# Patient Record
Sex: Male | Born: 1960 | Race: White | Hispanic: Refuse to answer | Smoking: Former smoker
Health system: Southern US, Community
[De-identification: ages and names within clinical notes are randomized; demographics above are authoritative.]

## PROBLEM LIST (undated history)

## (undated) DIAGNOSIS — F32A Depression, unspecified: Secondary | ICD-10-CM

## (undated) DIAGNOSIS — N281 Cyst of kidney, acquired: Secondary | ICD-10-CM

## (undated) DIAGNOSIS — F1910 Other psychoactive substance abuse, uncomplicated: Secondary | ICD-10-CM

## (undated) DIAGNOSIS — K219 Gastro-esophageal reflux disease without esophagitis: Secondary | ICD-10-CM

## (undated) DIAGNOSIS — E785 Hyperlipidemia, unspecified: Secondary | ICD-10-CM

## (undated) DIAGNOSIS — M479 Spondylosis, unspecified: Secondary | ICD-10-CM

## (undated) DIAGNOSIS — F419 Anxiety disorder, unspecified: Secondary | ICD-10-CM

## (undated) DIAGNOSIS — M1990 Unspecified osteoarthritis, unspecified site: Secondary | ICD-10-CM

## (undated) DIAGNOSIS — G709 Myoneural disorder, unspecified: Secondary | ICD-10-CM

## (undated) HISTORY — DX: Hyperlipidemia, unspecified: E78.5

## (undated) HISTORY — DX: Depression, unspecified: F32.A

## (undated) HISTORY — DX: Myoneural disorder, unspecified: G70.9

## (undated) HISTORY — DX: Other psychoactive substance abuse, uncomplicated: F19.10

## (undated) HISTORY — PX: OTHER SURGICAL HISTORY: SHX169

## (undated) HISTORY — DX: Cyst of kidney, acquired: N28.1

## (undated) HISTORY — DX: Spondylosis, unspecified: M47.9

## (undated) HISTORY — DX: Gastro-esophageal reflux disease without esophagitis: K21.9

## (undated) HISTORY — DX: Anxiety disorder, unspecified: F41.9

## (undated) HISTORY — DX: Unspecified osteoarthritis, unspecified site: M19.90

---

## 1993-02-14 HISTORY — PX: ANTERIOR FUSION CERVICAL SPINE: SUR626

## 2001-12-14 ENCOUNTER — Ambulatory Visit (HOSPITAL_COMMUNITY): Admission: RE | Admit: 2001-12-14 | Discharge: 2001-12-14 | Payer: Self-pay | Admitting: Gastroenterology

## 2001-12-14 ENCOUNTER — Encounter (INDEPENDENT_AMBULATORY_CARE_PROVIDER_SITE_OTHER): Payer: Self-pay | Admitting: Specialist

## 2002-09-18 ENCOUNTER — Encounter: Admission: RE | Admit: 2002-09-18 | Discharge: 2002-09-18 | Payer: Self-pay | Admitting: Internal Medicine

## 2002-09-18 ENCOUNTER — Encounter: Payer: Self-pay | Admitting: Internal Medicine

## 2002-10-17 ENCOUNTER — Encounter: Payer: Self-pay | Admitting: Gastroenterology

## 2002-10-17 ENCOUNTER — Ambulatory Visit (HOSPITAL_COMMUNITY): Admission: RE | Admit: 2002-10-17 | Discharge: 2002-10-17 | Payer: Self-pay | Admitting: Gastroenterology

## 2004-11-23 ENCOUNTER — Ambulatory Visit: Payer: Self-pay | Admitting: Internal Medicine

## 2004-12-14 ENCOUNTER — Ambulatory Visit: Payer: Self-pay | Admitting: Gastroenterology

## 2004-12-28 ENCOUNTER — Ambulatory Visit: Payer: Self-pay | Admitting: Gastroenterology

## 2005-02-03 ENCOUNTER — Ambulatory Visit: Payer: Self-pay | Admitting: Internal Medicine

## 2005-02-22 ENCOUNTER — Ambulatory Visit: Payer: Self-pay | Admitting: Internal Medicine

## 2005-05-17 ENCOUNTER — Ambulatory Visit: Payer: Self-pay | Admitting: Internal Medicine

## 2005-05-24 ENCOUNTER — Ambulatory Visit: Payer: Self-pay | Admitting: Internal Medicine

## 2005-10-28 ENCOUNTER — Ambulatory Visit (HOSPITAL_COMMUNITY): Payer: Self-pay | Admitting: Psychiatry

## 2006-02-24 ENCOUNTER — Ambulatory Visit (HOSPITAL_COMMUNITY): Payer: Self-pay | Admitting: Psychiatry

## 2006-09-15 ENCOUNTER — Ambulatory Visit (HOSPITAL_COMMUNITY): Payer: Self-pay | Admitting: Psychiatry

## 2007-02-23 ENCOUNTER — Ambulatory Visit (HOSPITAL_COMMUNITY): Payer: Self-pay | Admitting: Psychiatry

## 2007-06-22 ENCOUNTER — Ambulatory Visit (HOSPITAL_COMMUNITY): Payer: Self-pay | Admitting: Psychiatry

## 2007-10-26 ENCOUNTER — Ambulatory Visit (HOSPITAL_COMMUNITY): Payer: Self-pay | Admitting: Psychiatry

## 2008-02-25 ENCOUNTER — Ambulatory Visit (HOSPITAL_COMMUNITY): Payer: Self-pay | Admitting: Psychiatry

## 2008-05-05 DIAGNOSIS — K219 Gastro-esophageal reflux disease without esophagitis: Secondary | ICD-10-CM | POA: Insufficient documentation

## 2008-07-11 ENCOUNTER — Ambulatory Visit (HOSPITAL_COMMUNITY): Payer: Self-pay | Admitting: Psychiatry

## 2008-10-03 ENCOUNTER — Ambulatory Visit (HOSPITAL_COMMUNITY): Payer: Self-pay | Admitting: Psychiatry

## 2008-10-31 ENCOUNTER — Ambulatory Visit (HOSPITAL_COMMUNITY): Payer: Self-pay | Admitting: Psychiatry

## 2009-01-05 ENCOUNTER — Ambulatory Visit (HOSPITAL_COMMUNITY): Payer: Self-pay | Admitting: Psychiatry

## 2009-03-04 ENCOUNTER — Ambulatory Visit (HOSPITAL_COMMUNITY): Payer: Self-pay | Admitting: Psychiatry

## 2009-03-07 DIAGNOSIS — M47816 Spondylosis without myelopathy or radiculopathy, lumbar region: Secondary | ICD-10-CM | POA: Insufficient documentation

## 2009-04-23 ENCOUNTER — Emergency Department (HOSPITAL_BASED_OUTPATIENT_CLINIC_OR_DEPARTMENT_OTHER): Admission: EM | Admit: 2009-04-23 | Discharge: 2009-04-23 | Payer: Self-pay | Admitting: Emergency Medicine

## 2009-04-23 ENCOUNTER — Ambulatory Visit: Payer: Self-pay | Admitting: Diagnostic Radiology

## 2009-04-29 ENCOUNTER — Encounter: Admission: RE | Admit: 2009-04-29 | Discharge: 2009-04-29 | Payer: Self-pay | Admitting: Orthopaedic Surgery

## 2009-05-01 ENCOUNTER — Encounter: Admission: RE | Admit: 2009-05-01 | Discharge: 2009-07-30 | Payer: Self-pay | Admitting: Orthopaedic Surgery

## 2009-05-26 ENCOUNTER — Encounter: Admission: RE | Admit: 2009-05-26 | Discharge: 2009-05-26 | Payer: Self-pay | Admitting: Orthopaedic Surgery

## 2009-06-18 ENCOUNTER — Encounter: Admission: RE | Admit: 2009-06-18 | Discharge: 2009-06-18 | Payer: Self-pay | Admitting: Orthopaedic Surgery

## 2009-08-10 ENCOUNTER — Encounter: Admission: RE | Admit: 2009-08-10 | Discharge: 2009-08-19 | Payer: Self-pay | Admitting: Orthopaedic Surgery

## 2010-01-01 ENCOUNTER — Encounter: Admission: RE | Admit: 2010-01-01 | Discharge: 2010-01-01 | Payer: Self-pay | Admitting: Orthopaedic Surgery

## 2010-05-20 ENCOUNTER — Other Ambulatory Visit: Payer: Self-pay | Admitting: Orthopaedic Surgery

## 2010-05-20 DIAGNOSIS — M549 Dorsalgia, unspecified: Secondary | ICD-10-CM

## 2010-05-31 ENCOUNTER — Ambulatory Visit
Admission: RE | Admit: 2010-05-31 | Discharge: 2010-05-31 | Disposition: A | Discharge status: Home / Self Care | Payer: Self-pay | Source: Ambulatory Visit | Attending: Orthopaedic Surgery | Admitting: Orthopaedic Surgery

## 2010-05-31 DIAGNOSIS — M549 Dorsalgia, unspecified: Secondary | ICD-10-CM

## 2010-07-02 NOTE — Letter (Signed)
March 31, 2006    Ileene Musa  [address]  [place], [place] [address]   RE:  WINDLE, HUEBERT  MRN:  [id number]  /  DOB:  09-29-1960   Dear Reita Cliche,   The triage nurse states you declined an appointment to follow up your  cholesterol panel;therefore,I reviewed your chart to assess your  cadiovascular risks for premature heart attack or stroke.   The most important factor is your having quit smoking.  This decreased  your risk from 1 in 3 to approximately 25%, based on the special  cholesterol panel you had in October 2006.  At that time, your LDL, or  bad cholesterol, was 168; it was composed of 3563 total particles and  2954 small dense particles, which are atherogenic or plaque-forming.  You should have no more than 1300 total particles and no more than 850  small dense particles.  At that time, there was also a major dietary  contribution, probably from white carbohydrates such as potatoes, rice,  bread and pasta.  The pattern at that time also was a pre-diabetic one.   With the Vytorin 10/40, your LDL, or bad cholesterol, dropped 100 points  to 68, which would be an optimal level.  On the Vytorin, your good  cholesterol actually rose from 14 to 26; it should be over 40.  Unfortunately, you did have elevated liver enzymes.  It is unclear  whether this was related to the Vytorin.  Other medications or dietary  factors may have  played a role.  Excess Tylenol, vitamin A or alcohol  can also be a component.  The most common cause of liver enzyme  elevations in [place] is fat distribution in the abdomen with  involvement of the liver.   While on Zetia alone, your LDL was 108, which would be associated with a  10% to 15% risk.  Your liver enzymes improved dramatically and were  essentially normal on the Zetia.   Basically, you cannot have a heart attack or stroke unless you have  plaque or hardening of the arteries which progresses if  LDL, or bad  cholesterol, and sugars (blood glucoses) remain elevated.  Smoking  simply tears down the plaque and makes it more likely to rupture; that  is why it is so important that you have stopped smoking.  When you were  last seen on April 4, your blood pressure was 140/90.Your blood pressure  should be less than 130/80 on average; 140/90 would be associated with 4  times risk of heart attack or stroke because of its sheer force on any  plaque in the arteries.   You were scheduled for labs in September; the nurse informs me that you  will not have these until July and have stopped the Zetia.   I work for you and my job is to inform you of your risks and inform you  of your options to address those risks if you so choose.   Please feel free to get a second opinion concerning these risks; I will  be happy to arrange that consultation should you desire.    Sincerely,      Titus Dubin. Alwyn Ren, MD,FACP,FCCP  Electronically Signed    [facility]  DD: 03/31/2006  DT: 03/31/2006  Job #: [id number]

## 2010-09-27 ENCOUNTER — Other Ambulatory Visit: Payer: Self-pay | Admitting: Orthopaedic Surgery

## 2010-09-27 DIAGNOSIS — M545 Low back pain: Secondary | ICD-10-CM

## 2010-10-11 ENCOUNTER — Ambulatory Visit
Admission: RE | Admit: 2010-10-11 | Discharge: 2010-10-11 | Disposition: A | Discharge status: Home / Self Care | Payer: No Typology Code available for payment source | Source: Ambulatory Visit | Attending: Orthopaedic Surgery | Admitting: Orthopaedic Surgery

## 2010-10-11 DIAGNOSIS — M545 Low back pain: Secondary | ICD-10-CM

## 2010-10-11 MED ORDER — METHYLPREDNISOLONE ACETATE 40 MG/ML INJ SUSP (RADIOLOG
120.0000 mg | Freq: Once | INTRAMUSCULAR | Status: AC
  Administered 2010-10-11: 120 mg via EPIDURAL

## 2010-10-11 MED ORDER — IOHEXOL 180 MG/ML  SOLN
1.0000 mL | Freq: Once | INTRAMUSCULAR | Status: AC | PRN
  Administered 2010-10-11: 1 mL via EPIDURAL
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
## 2011-07-13 ENCOUNTER — Other Ambulatory Visit: Payer: Self-pay | Admitting: Neurosurgery

## 2011-07-13 DIAGNOSIS — M549 Dorsalgia, unspecified: Secondary | ICD-10-CM

## 2011-07-19 ENCOUNTER — Ambulatory Visit
Admission: RE | Admit: 2011-07-19 | Discharge: 2011-07-19 | Disposition: A | Discharge status: Home / Self Care | Payer: No Typology Code available for payment source | Source: Ambulatory Visit | Attending: Neurosurgery | Admitting: Neurosurgery

## 2011-07-19 DIAGNOSIS — M549 Dorsalgia, unspecified: Secondary | ICD-10-CM

## 2011-07-19 MED ORDER — IOHEXOL 180 MG/ML  SOLN
1.0000 mL | Freq: Once | INTRAMUSCULAR | Status: AC | PRN
  Administered 2011-07-19: 1 mL via EPIDURAL

## 2011-07-19 MED ORDER — METHYLPREDNISOLONE ACETATE 40 MG/ML INJ SUSP (RADIOLOG
120.0000 mg | Freq: Once | INTRAMUSCULAR | Status: AC
  Administered 2011-07-19: 120 mg via EPIDURAL

## 2011-11-21 ENCOUNTER — Other Ambulatory Visit: Payer: Self-pay | Admitting: Neurosurgery

## 2011-11-21 DIAGNOSIS — M549 Dorsalgia, unspecified: Secondary | ICD-10-CM

## 2011-11-21 DIAGNOSIS — M5410 Radiculopathy, site unspecified: Secondary | ICD-10-CM

## 2011-11-23 ENCOUNTER — Other Ambulatory Visit: Payer: Self-pay

## 2011-12-01 ENCOUNTER — Other Ambulatory Visit: Payer: Self-pay | Admitting: Neurosurgery

## 2011-12-01 ENCOUNTER — Ambulatory Visit
Admission: RE | Admit: 2011-12-01 | Discharge: 2011-12-01 | Disposition: A | Discharge status: Home / Self Care | Payer: Self-pay | Source: Ambulatory Visit | Attending: Neurosurgery | Admitting: Neurosurgery

## 2011-12-01 DIAGNOSIS — M5410 Radiculopathy, site unspecified: Secondary | ICD-10-CM

## 2011-12-01 DIAGNOSIS — M549 Dorsalgia, unspecified: Secondary | ICD-10-CM

## 2011-12-01 MED ORDER — IOHEXOL 180 MG/ML  SOLN
1.0000 mL | Freq: Once | INTRAMUSCULAR | Status: AC | PRN
  Administered 2011-12-01: 1 mL via EPIDURAL

## 2011-12-01 MED ORDER — METHYLPREDNISOLONE ACETATE 40 MG/ML INJ SUSP (RADIOLOG
120.0000 mg | Freq: Once | INTRAMUSCULAR | Status: AC
  Administered 2011-12-01: 120 mg via EPIDURAL

## 2011-12-09 ENCOUNTER — Encounter: Payer: Self-pay | Admitting: Gastroenterology

## 2012-02-23 ENCOUNTER — Ambulatory Visit (INDEPENDENT_AMBULATORY_CARE_PROVIDER_SITE_OTHER): Payer: Medicare Other | Admitting: Surgery

## 2012-02-23 ENCOUNTER — Encounter (INDEPENDENT_AMBULATORY_CARE_PROVIDER_SITE_OTHER): Payer: Self-pay | Admitting: Surgery

## 2012-02-23 VITALS — BP 144/72 | HR 68 | Temp 97.4°F | Resp 12 | Ht 70.0 in | Wt 208.0 lb

## 2012-02-23 DIAGNOSIS — K439 Ventral hernia without obstruction or gangrene: Secondary | ICD-10-CM

## 2012-02-23 NOTE — Progress Notes (Signed)
Chief Complaint:  Epigastric hernia  History of Present Illness:  Nathan Meyer is an 52 y.o. male who is followed by Dr. Rosanne Ashing little. He has chronic low back pain at L5 requiring steroid injections. He has a hernia slightly to the right of midline 3 fingerbreadths above the umbilicus. This is consistent with an epigastric hernia with preperitoneal fat herniated into the hernia. He does work out at [employer] and we does it sometimes gets large and sore. I explained the nature of this and what to look for including nausea and vomiting. He has not had that.    I discussed repair with him. I think at the moment he wants to hold off on doing that. I will give him a booklet on hernia repair. When he decides to have surgery would be glad to go ahead and proceed with a laparoscopically-assisted ventral hernia repair with mesh done as an outpatient at [facility].  Past Medical History  Diagnosis Date  . Arthritis   . GERD (gastroesophageal reflux disease)   . Hyperlipidemia   . Neuromuscular disorder   . Substance abuse     Past Surgical History  Procedure Date  . Anterior fusion cervical spine 1995  . Finger debridement     Current Outpatient Prescriptions  Medication Sig Dispense Refill  . buPROPion (WELLBUTRIN XL) 300 MG 24 hr tablet Take 300 mg by mouth daily.      . lansoprazole (PREVACID) 15 MG capsule Take 15 mg by mouth daily.      Marland Kitchen oxyCODONE-acetaminophen (PERCOCET) 7.5-325 MG per tablet Take 1 tablet by mouth every 4 (four) hours as needed.      . traZODone (DESYREL) 100 MG tablet Take 100 mg by mouth at bedtime. Takes (2) 100mg  tabs every night       Nsaids History reviewed. No pertinent family history. Social History:   reports that he quit smoking about 2 years ago. His smoking use included Cigarettes. He has a 25 pack-year smoking history. He has never used smokeless tobacco. He reports that he does not drink alcohol or use illicit drugs.   REVIEW OF SYSTEMS - PERTINENT  POSITIVES ONLY: noncontributory  Physical Exam:   Blood pressure 144/72, pulse 68, temperature 97.4 F (36.3 C), temperature source Temporal, resp. rate 12, height 5\' 10"  (1.778 m), weight 208 lb (94.348 kg). Body mass index is 29.84 kg/(m^2).  Gen:  WDWN white male NAD  Neurological: Alert and oriented to person, place, and time. Motor and sensory function is grossly intact  Head: Normocephalic and atraumatic.  Eyes: Conjunctivae are normal. Pupils are equal, round, and reactive to light. No scleral icterus.  Neck: Normal range of motion. Neck supple. No tracheal deviation or thyromegaly present.  Cardiovascular:  SR without murmurs or gallops.  No carotid bruits Respiratory: Effort normal.  No respiratory distress. No chest wall tenderness. Breath sounds normal.  No wheezes, rales or rhonchi.  Abdomen:  Easily palpable but diffuse hernia in the right upper quadrant slightly to the right of midline 3 fingerbreadths above the umbilicus consistent with a spontaneous ventral hernia with preperitoneal fat herniated above the fascia. GU: Musculoskeletal: Normal range of motion. Extremities are nontender. No cyanosis, edema or clubbing noted Lymphadenopathy: No cervical, preauricular, postauricular or axillary adenopathy is present Skin: Skin is warm and dry. No rash noted. No diaphoresis. No erythema. No pallor. Pscyh: Normal mood and affect. Behavior is normal. Judgment and thought content normal.   LABORATORY RESULTS: No results found for this or  any previous visit (from the past 48 hour(s)).  RADIOLOGY RESULTS: No results found.  Problem List: There is no problem list on file for this patient.   Assessment & Plan: Ventral hernia upper abdomen. Recommend ventral herniorrhaphy when patient decides he once to move forward with that. Booklet on hernia repairs given to him.    Matt B. Daphine Deutscher, MD, [facility] [phone] beeper [phone]  02/23/2012 12:18  PM

## 2012-06-06 ENCOUNTER — Encounter: Payer: Self-pay | Admitting: Gastroenterology

## 2012-08-06 ENCOUNTER — Ambulatory Visit: Payer: Medicare Other

## 2012-08-06 ENCOUNTER — Ambulatory Visit (INDEPENDENT_AMBULATORY_CARE_PROVIDER_SITE_OTHER): Payer: Medicare Other | Admitting: Internal Medicine

## 2012-08-06 VITALS — BP 117/77 | HR 84 | Temp 97.9°F | Resp 18 | Ht 70.0 in | Wt 191.0 lb

## 2012-08-06 DIAGNOSIS — K529 Noninfective gastroenteritis and colitis, unspecified: Secondary | ICD-10-CM

## 2012-08-06 DIAGNOSIS — R079 Chest pain, unspecified: Secondary | ICD-10-CM
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
## 2012-11-05 ENCOUNTER — Other Ambulatory Visit: Payer: Self-pay | Admitting: Neurosurgery

## 2012-11-05 DIAGNOSIS — M5416 Radiculopathy, lumbar region: Secondary | ICD-10-CM

## 2012-11-08 ENCOUNTER — Ambulatory Visit
Admission: RE | Admit: 2012-11-08 | Discharge: 2012-11-08 | Disposition: A | Discharge status: Home / Self Care | Payer: Medicare Other | Source: Ambulatory Visit | Attending: Neurosurgery | Admitting: Neurosurgery

## 2012-11-08 DIAGNOSIS — M5416 Radiculopathy, lumbar region: Secondary | ICD-10-CM

## 2012-11-08 MED ORDER — IOHEXOL 180 MG/ML  SOLN
1.0000 mL | Freq: Once | INTRAMUSCULAR | Status: AC | PRN
  Administered 2012-11-08: 1 mL via EPIDURAL

## 2012-11-08 MED ORDER — METHYLPREDNISOLONE ACETATE 40 MG/ML INJ SUSP (RADIOLOG
120.0000 mg | Freq: Once | INTRAMUSCULAR | Status: AC
  Administered 2012-11-08: 120 mg via EPIDURAL

## 2013-10-03 ENCOUNTER — Other Ambulatory Visit: Payer: Self-pay | Admitting: Gastroenterology

## 2015-02-18 ENCOUNTER — Emergency Department
Admission: EM | Admit: 2015-02-18 | Discharge: 2015-02-18 | Disposition: A | Discharge status: Home / Self Care | Payer: No Typology Code available for payment source | Attending: Emergency Medicine | Admitting: Emergency Medicine

## 2015-02-18 ENCOUNTER — Emergency Department: Payer: No Typology Code available for payment source

## 2015-02-18 DIAGNOSIS — Z792 Long term (current) use of antibiotics: Secondary | ICD-10-CM | POA: Diagnosis not present

## 2015-02-18 DIAGNOSIS — Y92410 Unspecified street and highway as the place of occurrence of the external cause: Secondary | ICD-10-CM | POA: Insufficient documentation

## 2015-02-18 DIAGNOSIS — Y9389 Activity, other specified: Secondary | ICD-10-CM | POA: Insufficient documentation

## 2015-02-18 DIAGNOSIS — Y998 Other external cause status: Secondary | ICD-10-CM | POA: Insufficient documentation

## 2015-02-18 DIAGNOSIS — M25511 Pain in right shoulder: Secondary | ICD-10-CM

## 2015-02-18 DIAGNOSIS — S199XXA Unspecified injury of neck, initial encounter: Secondary | ICD-10-CM | POA: Diagnosis not present

## 2015-02-18 DIAGNOSIS — S299XXA Unspecified injury of thorax, initial encounter: Secondary | ICD-10-CM | POA: Diagnosis not present

## 2015-02-18 DIAGNOSIS — M546 Pain in thoracic spine: Secondary | ICD-10-CM

## 2015-02-18 DIAGNOSIS — S4991XA Unspecified injury of right shoulder and upper arm, initial encounter: Secondary | ICD-10-CM | POA: Insufficient documentation

## 2015-02-18 DIAGNOSIS — Z87891 Personal history of nicotine dependence: Secondary | ICD-10-CM | POA: Diagnosis not present

## 2015-02-18 DIAGNOSIS — Z79899 Other long term (current) drug therapy: Secondary | ICD-10-CM | POA: Insufficient documentation

## 2015-02-18 MED ORDER — TIZANIDINE HCL 4 MG PO TABS: 4.0000 mg | ORAL_TABLET | Freq: Four times a day (QID) | ORAL | Status: DC | PRN

## 2015-02-18 MED ORDER — TIZANIDINE HCL 4 MG PO TABS
4.0000 mg | ORAL_TABLET | Freq: Once | ORAL | Status: AC
  Administered 2015-02-18: 4 mg via ORAL
  Filled 2015-02-18: qty 1
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
## 2015-03-03 ENCOUNTER — Ambulatory Visit
Admission: RE | Admit: 2015-03-03 | Discharge: 2015-03-03 | Disposition: A | Discharge status: Home / Self Care | Payer: Self-pay | Source: Ambulatory Visit | Attending: Family Medicine | Admitting: Family Medicine

## 2015-03-03 ENCOUNTER — Other Ambulatory Visit: Payer: Self-pay | Admitting: Family Medicine

## 2015-03-03 DIAGNOSIS — M545 Low back pain, unspecified: Secondary | ICD-10-CM

## 2016-12-13 DIAGNOSIS — M79644 Pain in right finger(s): Secondary | ICD-10-CM | POA: Insufficient documentation
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
## 2017-04-14 DIAGNOSIS — I10 Essential (primary) hypertension: Secondary | ICD-10-CM | POA: Insufficient documentation

## 2019-01-30 ENCOUNTER — Ambulatory Visit (INDEPENDENT_AMBULATORY_CARE_PROVIDER_SITE_OTHER): Payer: Self-pay | Admitting: Otolaryngology

## 2019-01-31 ENCOUNTER — Ambulatory Visit (INDEPENDENT_AMBULATORY_CARE_PROVIDER_SITE_OTHER): Payer: Self-pay | Admitting: Otolaryngology

## 2019-02-21 ENCOUNTER — Other Ambulatory Visit: Payer: Self-pay

## 2019-02-21 ENCOUNTER — Encounter (INDEPENDENT_AMBULATORY_CARE_PROVIDER_SITE_OTHER): Payer: Self-pay | Admitting: Otolaryngology

## 2019-02-21 ENCOUNTER — Ambulatory Visit (INDEPENDENT_AMBULATORY_CARE_PROVIDER_SITE_OTHER): Payer: Medicare Other | Admitting: Otolaryngology

## 2019-02-21 VITALS — Temp 98.1°F

## 2019-02-21 DIAGNOSIS — J387 Other diseases of larynx: Secondary | ICD-10-CM

## 2019-02-21 NOTE — Progress Notes (Signed)
HPI: Nathan Meyer is a 59 y.o. male who presents is referred by Dr. Therisa Doyne For evaluation of abnormal appearing epiglottis on recent upper endoscopy.  Photos of this revealed a rounded yellow density within the vallecular region of the epiglottis.  Patient denies sore throat.  No hoarseness..  Past Medical History:  Diagnosis Date  . Arthritis   . GERD (gastroesophageal reflux disease)   . Hyperlipidemia   . Neuromuscular disorder ([place])   . Substance abuse [facility])    Past Surgical History:  Procedure Laterality Date  . ANTERIOR FUSION CERVICAL SPINE  1995  . finger debridement     Social History   Socioeconomic History  . Marital status: Divorced    Spouse name: Not on file  . Number of children: Not on file  . Years of education: Not on file  . Highest education level: Not on file  Occupational History  . Not on file  Tobacco Use  . Smoking status: Current Every Day Smoker    Packs/day: 1.00    Years: 26.00    Pack years: 26.00    Types: Cigarettes    Start date: 76  . Smokeless tobacco: Never Used  Substance and Sexual Activity  . Alcohol use: No  . Drug use: No    Comment: former substance abuser - cocaine, pills, marijuana  . Sexual activity: Not on file  Other Topics Concern  . Not on file  Social History Narrative  . Not on file   Social Determinants of Health   Financial Resource Strain:   . Difficulty of Paying Living Expenses: Not on file  Food Insecurity:   . Worried About Charity fundraiser in the Last Year: Not on file  . Ran Out of Food in the Last Year: Not on file  Transportation Needs:   . Lack of Transportation (Medical): Not on file  . Lack of Transportation (Non-Medical): Not on file  Physical Activity:   . Days of Exercise per Week: Not on file  . Minutes of Exercise per Session: Not on file  Stress:   . Feeling of Stress : Not on file  Social Connections:   . Frequency of Communication with Friends and Family: Not on file  .  Frequency of Social Gatherings with Friends and Family: Not on file  . Attends Religious Services: Not on file  . Active Member of Clubs or Organizations: Not on file  . Attends Archivist Meetings: Not on file  . Marital Status: Not on file   Family History  Problem Relation Age of Onset  . COPD Father    Allergies  Allergen Reactions  . Nsaids Nausea And Vomiting    Only in very high doses   Prior to Admission medications   Medication Sig Start Date End Date Taking? Authorizing Provider  buPROPion (WELLBUTRIN XL) 300 MG 24 hr tablet Take 300 mg by mouth daily.   Yes [provider]  diazepam (VALIUM) 5 MG tablet Take 5 mg by mouth every 6 (six) hours as needed for anxiety.   Yes [provider]  lansoprazole (PREVACID) 15 MG capsule Take 15 mg by mouth daily.   Yes [provider]  minocycline (DYNACIN) 50 MG tablet Take 50 mg by mouth 2 (two) times daily.   Yes [provider]  oxyCODONE-acetaminophen (PERCOCET) 7.5-325 MG per tablet Take 1 tablet by mouth every 4 (four) hours as needed.   Yes [provider]  tiZANidine (ZANAFLEX) 4 MG tablet  Take 1 tablet (4 mg total) by mouth every 6 (six) hours as needed for muscle spasms. 02/18/15  Yes Triplett, Cari B, FNP  traZODone (DESYREL) 100 MG tablet Take 100 mg by mouth at bedtime. Takes (2) 100mg  tabs every night   Yes [provider]     Positive ROS: Otherwise negative  All other systems have been reviewed and were otherwise negative with the exception of those mentioned in the HPI and as above.  Physical Exam: Constitutional: Alert, well-appearing, no acute distress Ears: External ears without lesions or tenderness. Ear canals are clear bilaterally with intact, clear TMs.  Nasal: External nose without lesions. Septum relatively midline.. Clear nasal passages Indirect laryngoscopy revealed a large vallecular cyst within the right side of the right vallecula of the  epiglottis.  Epiglottis was displaced slightly posterior making visualization of the vocal cords difficult. Fiberoptic laryngoscopy revealed clear nasopharynx.  Again noted is a large vallecular cyst which appears benign.  Fiberoptic laryngoscopy on other side of the epiglottis revealed a clear endolarynx with normal-appearing laryngeal surface of epiglottis.  AE folds were normal.  Vocal cords were clear with normal mobility bilaterally. Oral: Lips and gums without lesions. Tongue and palate mucosa without lesions. Posterior oropharynx clear. Neck: No palpable adenopathy or masses Respiratory: Breathing comfortably  Skin: No facial/neck lesions or rash noted.  Laryngoscopy  Date/Time: 02/21/2019 5:22 PM Performed by: Rozetta Nunnery, MD Authorized by: Rozetta Nunnery, MD   Consent:    Consent obtained:  Verbal   Consent given by:  Patient   Risks discussed:  Pain Procedure details:    Indications: difficult indirect mirror examination     Medication:  Afrin   Scope location: left nare   Sinus:    Left nasopharynx: normal   Mouth:    Oropharynx: normal     lesion      comment:  Patient has a large right vallecular cyst Throat:    Pyriform sinus: normal     False vocal cords: normal     True vocal cords: normal   Comments:     Patient with a large right benign-appearing vallecular cyst.  This slightly displaces the epiglottis.  But patient is having no difficulty with breathing and no significant trouble swallowing.    Assessment: Benign vallecular cyst  Plan: No further therapy is required of this unless it becomes symptomatic.  These are quite frequent and benign but this is rather large but not really causing any significant symptoms.   Radene Journey, MD   CC:

## 2019-05-09 ENCOUNTER — Ambulatory Visit: Payer: Medicare Other | Attending: Internal Medicine

## 2019-05-09 DIAGNOSIS — Z23 Encounter for immunization: Secondary | ICD-10-CM

## 2019-05-09 NOTE — Progress Notes (Signed)
   Covid-19 Vaccination Clinic  Name:  DANYELLE DITTA    MRN: [id number] DOB: 04/20/1960  05/09/2019  Mr. Thorn was observed post Covid-19 immunization for 15 minutes without incident. He was provided with Vaccine Information Sheet and instruction to access the V-Safe system.   Mr. Buffone was instructed to call 911 with any severe reactions post vaccine: Marland Kitchen Difficulty breathing  . Swelling of face and throat  . A fast heartbeat  . A bad rash all over body  . Dizziness and weakness   Immunizations Administered    Name Date Dose VIS Date Route   Pfizer COVID-19 Vaccine 05/09/2019  1:57 PM 0.3 mL 01/25/2019 Intramuscular   Manufacturer: [place]   Lot: [id number]   [place]: [id number]

## 2019-06-03 ENCOUNTER — Ambulatory Visit: Payer: Medicare Other | Attending: Internal Medicine

## 2019-06-03 DIAGNOSIS — Z23 Encounter for immunization: Secondary | ICD-10-CM

## 2019-06-03 NOTE — Progress Notes (Signed)
   Covid-19 Vaccination Clinic  Name:  Nathan Meyer    MRN: [id number] DOB: 10-Jan-1961  06/03/2019  [name] was observed post Covid-19 immunization for 15 minutes without incident. He was provided with Vaccine Information Sheet and instruction to access the V-Safe system.   [name] was instructed to call 911 with any severe reactions post vaccine: Marland Kitchen Difficulty breathing  . Swelling of face and throat  . A fast heartbeat  . A bad rash all over body  . Dizziness and weakness   Immunizations Administered    Name Date Dose VIS Date Route   Pfizer COVID-19 Vaccine 06/03/2019 12:00 PM 0.3 mL 04/10/2018 Intramuscular   Manufacturer: [place]   Lot: [id number]   [place]: [id number]

## 2019-12-14 ENCOUNTER — Other Ambulatory Visit: Payer: Self-pay

## 2019-12-14 ENCOUNTER — Ambulatory Visit: Payer: Medicare Other | Attending: Internal Medicine

## 2019-12-14 DIAGNOSIS — Z23 Encounter for immunization: Secondary | ICD-10-CM

## 2019-12-14 NOTE — Progress Notes (Signed)
   Covid-19 Vaccination Clinic  Name:  DAVIUS GOUDEAU    MRN: [id number] DOB: 1960/04/09  12/14/2019  Mr. Zidek was observed post Covid-19 immunization for 15 minutes without incident. He was provided with Vaccine Information Sheet and instruction to access the V-Safe system.   Mr. Bienvenue was instructed to call 911 with any severe reactions post vaccine: Marland Kitchen Difficulty breathing  . Swelling of face and throat  . A fast heartbeat  . A bad rash all over body  . Dizziness and weakness

## 2020-01-29 ENCOUNTER — Telehealth (INDEPENDENT_AMBULATORY_CARE_PROVIDER_SITE_OTHER): Payer: Self-pay

## 2020-02-17 ENCOUNTER — Encounter (INDEPENDENT_AMBULATORY_CARE_PROVIDER_SITE_OTHER): Payer: Self-pay | Admitting: Otolaryngology

## 2020-02-17 ENCOUNTER — Other Ambulatory Visit: Payer: Self-pay

## 2020-02-17 ENCOUNTER — Ambulatory Visit (INDEPENDENT_AMBULATORY_CARE_PROVIDER_SITE_OTHER): Payer: Medicare Other | Admitting: Otolaryngology

## 2020-02-17 VITALS — Temp 97.7°F

## 2020-02-17 DIAGNOSIS — J387 Other diseases of larynx: Secondary | ICD-10-CM | POA: Diagnosis not present

## 2020-02-17 NOTE — Progress Notes (Signed)
HPI: Nathan Meyer is a 60 y.o. male who returns today for evaluation of follicular cyst.  He feels like it ruptured a couple weeks ago and that he was breathing better.  He still feels like he has occasional trouble swallowing.  Denies any sore throat.  He is breathing comfortably in the office today. Of note he stated that he stopped smoking last month after being seen here and he has not smoked any more since that time.  Past Medical History:  Diagnosis Date  . Arthritis   . GERD (gastroesophageal reflux disease)   . Hyperlipidemia   . Neuromuscular disorder ([place])   . Substance abuse [facility])    Past Surgical History:  Procedure Laterality Date  . ANTERIOR FUSION CERVICAL SPINE  1995  . finger debridement     Social History   Socioeconomic History  . Marital status: Divorced    Spouse name: Not on file  . Number of children: Not on file  . Years of education: Not on file  . Highest education level: Not on file  Occupational History  . Not on file  Tobacco Use  . Smoking status: Former Smoker    Packs/day: 1.00    Years: 36.00    Pack years: 36.00    Types: Cigarettes    Start date: 1985    Quit date: 2021    Years since quitting: 1.0  . Smokeless tobacco: Never Used  Substance and Sexual Activity  . Alcohol use: No  . Drug use: No    Comment: former substance abuser - cocaine, pills, marijuana  . Sexual activity: Not on file  Other Topics Concern  . Not on file  Social History Narrative  . Not on file   Social Determinants of Health   Financial Resource Strain: Not on file  Food Insecurity: Not on file  Transportation Needs: Not on file  Physical Activity: Not on file  Stress: Not on file  Social Connections: Not on file   Family History  Problem Relation Age of Onset  . COPD Father    Allergies  Allergen Reactions  . Nsaids Nausea And Vomiting    Only in very high doses   Prior to Admission medications   Medication Sig Start Date End Date  Taking? Authorizing Provider  buPROPion (WELLBUTRIN XL) 300 MG 24 hr tablet Take 300 mg by mouth daily.    [provider]  diazepam (VALIUM) 5 MG tablet Take 5 mg by mouth every 6 (six) hours as needed for anxiety.    [provider]  lansoprazole (PREVACID) 15 MG capsule Take 15 mg by mouth daily.    [provider]  minocycline (DYNACIN) 50 MG tablet Take 50 mg by mouth 2 (two) times daily.    [provider]  oxyCODONE-acetaminophen (PERCOCET) 7.5-325 MG per tablet Take 1 tablet by mouth every 4 (four) hours as needed.    [provider]  tiZANidine (ZANAFLEX) 4 MG tablet Take 1 tablet (4 mg total) by mouth every 6 (six) hours as needed for muscle spasms. 02/18/15   Triplett, Johnette Abraham B, FNP  traZODone (DESYREL) 100 MG tablet Take 100 mg by mouth at bedtime. Takes (2) 100mg  tabs every night    [provider]     Positive ROS: Otherwise negative  All other systems have been reviewed and were otherwise negative with the exception of those mentioned in the HPI and as above.  Physical Exam: Constitutional: Alert, well-appearing, no acute distress Ears: External ears  without lesions or tenderness. Ear canals are clear bilaterally with intact, clear TMs.  Nasal: External nose without lesions. . Clear nasal passages Oral: Lips and gums without lesions. Tongue and palate mucosa without lesions. Posterior oropharynx clear. Indirect laryngoscopy revealed moderate sized vallecular cyst in the right vallecular region he has other vallecular cyst but are smaller on the left side.  The right vallecular cyst has several nodules.  Mucosa is intact.  No mucosal lesions noted.  Vocal cords were clear with normal vocal mobility. Neck: No palpable adenopathy or masses Respiratory: Breathing comfortably  Skin: No facial/neck lesions or rash noted.  Procedures  Assessment: Vallecular cyst.  Plan: At this point did not recommend any intervention concerning  the cyst unless it becomes more symptomatic. He will follow-up as needed.   Narda Bonds, MD

## 2020-07-29 ENCOUNTER — Other Ambulatory Visit: Payer: Self-pay | Admitting: Family Medicine

## 2020-07-29 DIAGNOSIS — R109 Unspecified abdominal pain: Secondary | ICD-10-CM

## 2020-08-07 ENCOUNTER — Ambulatory Visit
Admission: RE | Admit: 2020-08-07 | Discharge: 2020-08-07 | Disposition: A | Discharge status: Home / Self Care | Payer: Medicare Other | Source: Ambulatory Visit | Attending: Family Medicine | Admitting: Family Medicine

## 2020-08-07 DIAGNOSIS — R109 Unspecified abdominal pain: Secondary | ICD-10-CM

## 2020-08-20 ENCOUNTER — Telehealth: Payer: Self-pay | Admitting: Gastroenterology

## 2020-08-20 NOTE — Telephone Encounter (Signed)
Review of outside records to be scanned into the chart  2020 EGD at [place] by Dr. Therisa Doyne Epiglottis appeared enlarged. No endoscopic esophageal abnormality to explain patient's dysphagia.  Biopsied. LA grade B esophagitis distally.  Biopsied. Erythematous mucosa in the antrum.  Biopsied. Normal examined duodenum. Pathology to be reviewed and ENT evaluation to be placed  2020 colonoscopy at [facility] by Dr. Therisa Doyne Hemorrhoids found on perineal exam. The exam portion of the ileum was normal. 64 to 6 mm polyps in the rectum and in the sigmoid colon were removed piecemeal using a cold biopsy forceps.  Resected and retrieved. 1, 7 mm polyp in the descending colon removed with cold snare.  Resected and retrieved. Diverticulosis in the sigmoid colon and in the descending colon. Nonbleeding internal hemorrhoids.  Pathology Chronic inactive gastritis without H. pylori. Squamous and gastric type mucosa with reflux changes but no Barrett's. Reactive squamous mucosa without eosinophilic esophagitis. Hyperplastic polyps of the colon.   Request received to transfer GI care from outside practice to [facility].  We appreciate the interest in our practice, however at this time due to high demand from patients without established GI providers we cannot guarantee that the patient will be seen for at least 2 to 3 months.  If the patient still wants to be seen then he may be scheduled at that time interval.  In the interim if he is having other or active GI issues, he should follow-up with his established GI provider at [facility].   Justice Britain, MD [facility] Advanced [facility]oscopy Office # [phone]

## 2020-08-20 NOTE — Telephone Encounter (Signed)
Hey Dr. Rush Landmark,   We received a transfer of care for diverticulitis/colon. Patient have been seen at [facility] with last colonoscopy in 2020. We have records, will send for review. Could you please review and advise on scheduling?  Thank you.

## 2020-08-21 ENCOUNTER — Other Ambulatory Visit: Payer: Medicare Other

## 2020-08-24 ENCOUNTER — Encounter: Payer: Self-pay | Admitting: Gastroenterology

## 2020-08-24 NOTE — Telephone Encounter (Signed)
Patient has been scheduled for 10/15/20.

## 2020-10-15 ENCOUNTER — Ambulatory Visit: Payer: Medicare Other | Admitting: Gastroenterology

## 2021-05-05 ENCOUNTER — Encounter: Payer: Self-pay | Admitting: Nurse Practitioner

## 2021-05-05 ENCOUNTER — Ambulatory Visit: Payer: Medicare Other | Admitting: Nurse Practitioner

## 2021-05-05 ENCOUNTER — Other Ambulatory Visit: Payer: Self-pay

## 2021-05-05 ENCOUNTER — Ambulatory Visit: Payer: PPO | Admitting: Nurse Practitioner

## 2021-05-05 VITALS — BP 104/72 | HR 98 | Temp 98.6°F | Resp 16 | Ht 72.0 in | Wt 223.0 lb

## 2021-05-05 DIAGNOSIS — E559 Vitamin D deficiency, unspecified: Secondary | ICD-10-CM

## 2021-05-05 DIAGNOSIS — M51369 Other intervertebral disc degeneration, lumbar region without mention of lumbar back pain or lower extremity pain: Secondary | ICD-10-CM

## 2021-05-05 DIAGNOSIS — E291 Testicular hypofunction: Secondary | ICD-10-CM

## 2021-05-05 DIAGNOSIS — I10 Essential (primary) hypertension: Secondary | ICD-10-CM

## 2021-05-05 DIAGNOSIS — M5442 Lumbago with sciatica, left side: Secondary | ICD-10-CM

## 2021-05-05 DIAGNOSIS — E782 Mixed hyperlipidemia: Secondary | ICD-10-CM | POA: Diagnosis not present

## 2021-05-05 DIAGNOSIS — M47816 Spondylosis without myelopathy or radiculopathy, lumbar region: Secondary | ICD-10-CM

## 2021-05-05 DIAGNOSIS — G8929 Other chronic pain: Secondary | ICD-10-CM

## 2021-05-05 DIAGNOSIS — K439 Ventral hernia without obstruction or gangrene: Secondary | ICD-10-CM

## 2021-05-05 DIAGNOSIS — Z7689 Persons encountering health services in other specified circumstances: Secondary | ICD-10-CM

## 2021-05-05 DIAGNOSIS — K219 Gastro-esophageal reflux disease without esophagitis: Secondary | ICD-10-CM

## 2021-05-05 DIAGNOSIS — E538 Deficiency of other specified B group vitamins: Secondary | ICD-10-CM | POA: Diagnosis not present

## 2021-05-05 DIAGNOSIS — M5136 Other intervertebral disc degeneration, lumbar region: Secondary | ICD-10-CM

## 2021-05-05 DIAGNOSIS — M5441 Lumbago with sciatica, right side: Secondary | ICD-10-CM

## 2021-05-05 MED ORDER — IRBESARTAN 75 MG PO TABS: 75.0000 mg | ORAL_TABLET | Freq: Every day | ORAL | 1 refills | Status: DC

## 2021-05-05 MED ORDER — TRAZODONE HCL 100 MG PO TABS: 100.0000 mg | ORAL_TABLET | Freq: Every day | ORAL | 2 refills | Status: DC
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
## 2021-05-16 ENCOUNTER — Encounter: Payer: Self-pay | Admitting: Nurse Practitioner

## 2021-05-17 DIAGNOSIS — K219 Gastro-esophageal reflux disease without esophagitis: Secondary | ICD-10-CM | POA: Diagnosis not present

## 2021-05-17 DIAGNOSIS — M5136 Other intervertebral disc degeneration, lumbar region: Secondary | ICD-10-CM | POA: Diagnosis not present

## 2021-05-17 DIAGNOSIS — K439 Ventral hernia without obstruction or gangrene: Secondary | ICD-10-CM | POA: Diagnosis not present

## 2021-05-17 DIAGNOSIS — I10 Essential (primary) hypertension: Secondary | ICD-10-CM | POA: Diagnosis not present

## 2021-05-17 DIAGNOSIS — M5441 Lumbago with sciatica, right side: Secondary | ICD-10-CM | POA: Diagnosis not present

## 2021-05-17 DIAGNOSIS — E291 Testicular hypofunction: Secondary | ICD-10-CM | POA: Diagnosis not present

## 2021-05-17 DIAGNOSIS — E538 Deficiency of other specified B group vitamins: Secondary | ICD-10-CM | POA: Diagnosis not present

## 2021-05-17 DIAGNOSIS — E782 Mixed hyperlipidemia: Secondary | ICD-10-CM | POA: Diagnosis not present

## 2021-05-17 DIAGNOSIS — M5442 Lumbago with sciatica, left side: Secondary | ICD-10-CM | POA: Diagnosis not present

## 2021-05-17 DIAGNOSIS — M47816 Spondylosis without myelopathy or radiculopathy, lumbar region: Secondary | ICD-10-CM | POA: Diagnosis not present

## 2021-05-17 DIAGNOSIS — G8929 Other chronic pain: Secondary | ICD-10-CM | POA: Diagnosis not present

## 2021-05-17 DIAGNOSIS — E559 Vitamin D deficiency, unspecified: Secondary | ICD-10-CM | POA: Diagnosis not present

## 2021-05-19 ENCOUNTER — Telehealth: Payer: Self-pay

## 2021-05-20 ENCOUNTER — Telehealth: Payer: Self-pay

## 2021-05-20 DIAGNOSIS — Z7689 Persons encountering health services in other specified circumstances: Secondary | ICD-10-CM

## 2021-05-20 DIAGNOSIS — M47816 Spondylosis without myelopathy or radiculopathy, lumbar region: Secondary | ICD-10-CM

## 2021-05-20 DIAGNOSIS — G8929 Other chronic pain: Secondary | ICD-10-CM

## 2021-05-20 DIAGNOSIS — M5136 Other intervertebral disc degeneration, lumbar region: Secondary | ICD-10-CM

## 2021-05-21 ENCOUNTER — Other Ambulatory Visit: Payer: Self-pay | Admitting: Nurse Practitioner

## 2021-05-21 LAB — LIPID PANEL
Chol/HDL Ratio: 6.2 ratio — ABNORMAL HIGH (ref 0.0–5.0)
Cholesterol, Total: 173 mg/dL (ref 100–199)
HDL: 28 mg/dL — ABNORMAL LOW (ref >39)
LDL Chol Calc (NIH): 94 mg/dL (ref 0–99)
Triglycerides: 306 mg/dL — ABNORMAL HIGH (ref 0–149)
VLDL Cholesterol Cal: 51 mg/dL — ABNORMAL HIGH (ref 5–40)

## 2021-05-21 LAB — CBC WITH DIFFERENTIAL/PLATELET
Basophils Absolute: 0.1 10*3/uL (ref 0.0–0.2)
Basos: 1 %
EOS (ABSOLUTE): 0.4 10*3/uL (ref 0.0–0.4)
Eos: 4 %
Hematocrit: 40.5 % (ref 37.5–51.0)
Hemoglobin: 14.3 g/dL (ref 13.0–17.7)
Immature Grans (Abs): 0.1 10*3/uL (ref 0.0–0.1)
Immature Granulocytes: 1 %
Lymphocytes Absolute: 2.4 10*3/uL (ref 0.7–3.1)
Lymphs: 28 %
MCH: 33 pg (ref 26.6–33.0)
MCHC: 35.3 g/dL (ref 31.5–35.7)
MCV: 94 fL (ref 79–97)
Monocytes Absolute: 0.7 10*3/uL (ref 0.1–0.9)
Monocytes: 8 %
Neutrophils Absolute: 5 10*3/uL (ref 1.4–7.0)
Neutrophils: 58 %
Platelets: 207 10*3/uL (ref 150–450)
RBC: 4.33 x10E6/uL (ref 4.14–5.80)
RDW: 12.8 % (ref 11.6–15.4)
WBC: 8.6 10*3/uL (ref 3.4–10.8)

## 2021-05-21 LAB — IRON,TIBC AND FERRITIN PANEL
Ferritin: 139 ng/mL (ref 30–400)
Iron Saturation: 35 % (ref 15–55)
Iron: 118 ug/dL (ref 38–169)
Total Iron Binding Capacity: 340 ug/dL (ref 250–450)
UIBC: 222 ug/dL (ref 111–343)

## 2021-05-21 LAB — TSH+FREE T4
Free T4: 1.32 ng/dL (ref 0.82–1.77)
TSH: 1.86 u[IU]/mL (ref 0.450–4.500)

## 2021-05-21 LAB — CMP14+EGFR
ALT: 24 IU/L (ref 0–44)
AST: 25 IU/L (ref 0–40)
Albumin/Globulin Ratio: 1.4 (ref 1.2–2.2)
Albumin: 4.2 g/dL (ref 3.8–4.8)
Alkaline Phosphatase: 100 IU/L (ref 44–121)
BUN/Creatinine Ratio: 11 (ref 10–24)
BUN: 12 mg/dL (ref 8–27)
Bilirubin Total: 0.3 mg/dL (ref 0.0–1.2)
CO2: 28 mmol/L (ref 20–29)
Calcium: 9.2 mg/dL (ref 8.6–10.2)
Chloride: 95 mmol/L — ABNORMAL LOW (ref 96–106)
Creatinine, Ser: 1.12 mg/dL (ref 0.76–1.27)
Globulin, Total: 3.1 g/dL (ref 1.5–4.5)
Glucose: 96 mg/dL (ref 70–99)
Potassium: 4.4 mmol/L (ref 3.5–5.2)
Sodium: 136 mmol/L (ref 134–144)
Total Protein: 7.3 g/dL (ref 6.0–8.5)
eGFR: 75 mL/min/{1.73_m2} (ref >59)

## 2021-05-21 LAB — B12 AND FOLATE PANEL
Folate: 6.1 ng/mL (ref >3.0)
Vitamin B-12: 1093 pg/mL (ref 232–1245)

## 2021-05-21 LAB — VITAMIN D 25 HYDROXY (VIT D DEFICIENCY, FRACTURES): Vit D, 25-Hydroxy: 31.3 ng/mL (ref 30.0–100.0)

## 2021-05-21 LAB — TESTOSTERONE,FREE AND TOTAL
Testosterone, Free: 1.8 pg/mL — ABNORMAL LOW (ref 6.6–18.1)
Testosterone: 173 ng/dL — ABNORMAL LOW (ref 264–916)

## 2021-05-22 MED ORDER — OXYCODONE-ACETAMINOPHEN 7.5-325 MG PO TABS: 1.0000 | ORAL_TABLET | Freq: Three times a day (TID) | ORAL | 0 refills | Status: DC | PRN

## 2021-05-22 MED ORDER — ALPRAZOLAM 0.5 MG PO TABS: 0.5000 mg | ORAL_TABLET | Freq: Three times a day (TID) | ORAL | 0 refills | Status: DC | PRN

## 2021-05-22 NOTE — Telephone Encounter (Signed)
Prescription already sent with the dose and frequency that is appropriate for primary care to prescribe. The original dose and frequency being prescribed to the patient is too high unless he is under the care of a pain medicine specialist. He has already been referred.  ?

## 2021-05-24 ENCOUNTER — Telehealth: Payer: Self-pay

## 2021-05-24 NOTE — Telephone Encounter (Signed)
Done

## 2021-05-24 NOTE — Telephone Encounter (Signed)
Try to call pt voicemail full ? ?

## 2021-05-24 NOTE — Telephone Encounter (Signed)
PM referral sent via [facility] to Dr. Micael Hampshire w/ [facility] ?

## 2021-05-26 NOTE — Progress Notes (Signed)
I have reviewed the lab results. There are no critically abnormal values requiring immediate intervention but there are some abnormals that will be discussed at the next office visit.  

## 2021-05-27 ENCOUNTER — Other Ambulatory Visit: Payer: Self-pay | Admitting: Nurse Practitioner

## 2021-05-27 ENCOUNTER — Telehealth: Payer: Self-pay

## 2021-05-27 MED ORDER — OXYCODONE-ACETAMINOPHEN 7.5-325 MG PO TABS: 1.0000 | ORAL_TABLET | Freq: Three times a day (TID) | ORAL | 0 refills | Status: DC | PRN

## 2021-05-28 NOTE — Telephone Encounter (Signed)
Pt advised that we send med  

## 2021-06-02 ENCOUNTER — Ambulatory Visit: Payer: PPO | Admitting: Nurse Practitioner

## 2021-06-08 ENCOUNTER — Ambulatory Visit: Payer: PPO | Admitting: Nurse Practitioner

## 2021-06-09 ENCOUNTER — Ambulatory Visit (INDEPENDENT_AMBULATORY_CARE_PROVIDER_SITE_OTHER): Payer: PPO | Admitting: Nurse Practitioner

## 2021-06-09 ENCOUNTER — Encounter: Payer: Self-pay | Admitting: Nurse Practitioner

## 2021-06-09 VITALS — BP 122/71 | HR 75 | Temp 98.0°F | Resp 16 | Ht 72.0 in | Wt 219.4 lb

## 2021-06-09 DIAGNOSIS — E559 Vitamin D deficiency, unspecified: Secondary | ICD-10-CM | POA: Diagnosis not present

## 2021-06-09 DIAGNOSIS — M5441 Lumbago with sciatica, right side: Secondary | ICD-10-CM | POA: Diagnosis not present

## 2021-06-09 DIAGNOSIS — K219 Gastro-esophageal reflux disease without esophagitis: Secondary | ICD-10-CM | POA: Diagnosis not present

## 2021-06-09 DIAGNOSIS — Z0001 Encounter for general adult medical examination with abnormal findings: Secondary | ICD-10-CM | POA: Diagnosis not present

## 2021-06-09 DIAGNOSIS — F32A Depression, unspecified: Secondary | ICD-10-CM

## 2021-06-09 DIAGNOSIS — M5442 Lumbago with sciatica, left side: Secondary | ICD-10-CM | POA: Diagnosis not present

## 2021-06-09 DIAGNOSIS — E782 Mixed hyperlipidemia: Secondary | ICD-10-CM | POA: Diagnosis not present

## 2021-06-09 DIAGNOSIS — M47816 Spondylosis without myelopathy or radiculopathy, lumbar region: Secondary | ICD-10-CM

## 2021-06-09 DIAGNOSIS — D229 Melanocytic nevi, unspecified: Secondary | ICD-10-CM

## 2021-06-09 DIAGNOSIS — I10 Essential (primary) hypertension: Secondary | ICD-10-CM | POA: Diagnosis not present

## 2021-06-09 DIAGNOSIS — G8929 Other chronic pain: Secondary | ICD-10-CM

## 2021-06-09 DIAGNOSIS — Z7689 Persons encountering health services in other specified circumstances: Secondary | ICD-10-CM

## 2021-06-09 DIAGNOSIS — R300 Dysuria: Secondary | ICD-10-CM | POA: Diagnosis not present

## 2021-06-09 DIAGNOSIS — E291 Testicular hypofunction: Secondary | ICD-10-CM

## 2021-06-09 DIAGNOSIS — F419 Anxiety disorder, unspecified: Secondary | ICD-10-CM

## 2021-06-09 MED ORDER — OXYCODONE-ACETAMINOPHEN 7.5-325 MG PO TABS: 1.0000 | ORAL_TABLET | Freq: Three times a day (TID) | ORAL | 0 refills | Status: DC | PRN

## 2021-06-09 MED ORDER — FLUOXETINE HCL 40 MG PO CAPS: 40.0000 mg | ORAL_CAPSULE | Freq: Every day | ORAL | 3 refills | Status: DC

## 2021-06-09 MED ORDER — LANSOPRAZOLE 15 MG PO CPDR: 15.0000 mg | DELAYED_RELEASE_CAPSULE | Freq: Every day | ORAL | 3 refills | Status: DC

## 2021-06-09 MED ORDER — ALPRAZOLAM 0.5 MG PO TABS: 0.5000 mg | ORAL_TABLET | Freq: Three times a day (TID) | ORAL | 2 refills | Status: DC | PRN

## 2021-06-09 MED ORDER — HYDROCHLOROTHIAZIDE 12.5 MG PO CAPS: 12.5000 mg | ORAL_CAPSULE | Freq: Every morning | ORAL | 3 refills | Status: DC

## 2021-06-09 MED ORDER — IRBESARTAN 75 MG PO TABS: 75.0000 mg | ORAL_TABLET | Freq: Every day | ORAL | 3 refills | Status: DC

## 2021-06-09 MED ORDER — ATORVASTATIN CALCIUM 20 MG PO TABS: 20.0000 mg | ORAL_TABLET | Freq: Every day | ORAL | 3 refills | Status: DC

## 2021-06-09 MED ORDER — TRAZODONE HCL 100 MG PO TABS: 100.0000 mg | ORAL_TABLET | Freq: Every day | ORAL | 3 refills | Status: DC

## 2021-06-09 NOTE — Progress Notes (Signed)
[facility] [place], [place] [address]  Internal MEDICINE  Office Visit Note  Patient Name: Nathan Meyer  [id number]  [id number]  Date of Service: 06/09/2021  Chief Complaint  Patient presents with   Medicare Wellness    Discuss meds   Gastroesophageal Reflux   Hyperlipidemia   Results    labs   Medication Refill    HPI Nathan Meyer presents for an annual well visit and physical exam. He is a well-appearing 61 year old male with osteoarthritis, GERD, hyperlipidemia, anxiety, hypertension, chronic pain, depression.  Patient is having issues with fatigue, weakness, low energy and is taking a lot of naps.   He lives at home with significant other, he is on disability due to depression and chronic back pain. He is working on eating a healthier diet and does some walking for physical activity.  Patient was smoking cigarettes still sometimes not every day.  He reports today that he quit smoking 2 days ago and is already starting to feel better and states that he is breathing better. He has multiple atypical skin moles and would like to see dermatology and have some or all of them removed. --At his initial visit to establish care in March, he was coming from a PCP who has been handling a significant amount of pain management for the patient including high doses that most primary care providers would not be comfortable administering to a client in an outpatient setting.  Initially we are focusing on getting the patient established with a pain management specialist but we have not found a specialist that will prescribe the medications as they would initially want to do injections which the patient has had done before.  In the meantime, to prevent the patient from having withdrawal from not having the chronic pain and anxiety medications that he has been on, he was prescribed Percocet and alprazolam at lower doses and frequency that he was on before. -- It has been explained  to the patient that is a primary care provider the original doses of his medications were not appropriate for a PCP to prescribe and that he would have to have a lower dose and/or decreased frequency.  The patient has been aware of this and understood.  As of today, he is managing very well on the decreased doses of Percocet and alprazolam. Original alprazolam dose: 1 mg 4x daily Current alprazolam dose: 0.5 mg TID prn, but patient usually only takes twice daily Original percocet dose: 7.5-325 mg, take 1 tablet every 4 hours prn for pain Current percocet dose: 7.5-325 mg take 1 tablet every 8 hours prn for moderate to severe pain.  -- Although he can take the Percocet up to 3 times daily as needed, patient does not always take it 3 times a day.  He reports that his typical level on the pain scale is 5 out of 10 most recently which is actually low for him. He reports that he has been in a kind of detox mode with the decreased doses of alprazolam and Percocet and that has been somewhat difficult but he has tolerated it because overall he has been feeling better and his mind has been clear and his pain has improved.  He would like to continue the decreased doses of his medications and in the future possibly decrease the doses again once he is ready. He also takes the muscle relaxant carisoprodol 350 mg and the prior prescription states that he can take it twice daily.  This  medication has not been renewed nor has been changed because the patient reports that he only takes it when he needs it which is not every day and is only a couple times a month, maybe. Patient had his labs drawn and the results were discussed during his office visit today: -- CBC is normal --Metabolic panel is grossly normal.  Kidney and liver function are normal according to labs. --Cholesterol levels are abnormal with no prior values to readily compare.  His triglycerides are elevated at 306, HDL is 28, VLDL is 51.  His total  cholesterol and LDL are within normal limits.  His cholesterol/HDL ratio is 6.2 which is elevated and considered slightly above average risk for developing CVD. --Thyroid levels are normal --Vitamin D is low normal at 31.3 --Vitamin B12 and folate are within normal limits, iron panel is within normal limits -- Testosterone is low at 173 and free testosterone is low at 1.8.   Current Medication: Outpatient Encounter Medications as of 06/09/2021  Medication Sig   carisoprodol (SOMA) 350 MG tablet Take 350 mg by mouth 2 (two) times daily.   Cholecalciferol (VITAMIN D3 PO) Take by mouth.   Cyanocobalamin (B-12 PO) Take by mouth.   Multiple Vitamin (MULTIVITAMIN ADULT PO) Take by mouth.   [DISCONTINUED] ALPRAZolam (XANAX) 0.5 MG tablet Take 1 tablet (0.5 mg total) by mouth 3 (three) times daily as needed for anxiety.   [DISCONTINUED] atorvastatin (LIPITOR) 20 MG tablet Take 20 mg by mouth at bedtime.   [DISCONTINUED] FLUoxetine (PROZAC) 40 MG capsule Take 40 mg by mouth daily.   [DISCONTINUED] hydrochlorothiazide (MICROZIDE) 12.5 MG capsule Take 12.5 mg by mouth every morning.   [DISCONTINUED] irbesartan (AVAPRO) 75 MG tablet Take 1 tablet (75 mg total) by mouth daily.   [DISCONTINUED] lansoprazole (PREVACID) 15 MG capsule Take 15 mg by mouth daily.   [DISCONTINUED] oxyCODONE-acetaminophen (PERCOCET) 7.5-325 MG tablet Take 1 tablet by mouth every 8 (eight) hours as needed.   [DISCONTINUED] traZODone (DESYREL) 100 MG tablet Take 1-2 tablets (100-200 mg total) by mouth at bedtime. Takes (2) '100mg'$  tabs every night   ALPRAZolam (XANAX) 0.5 MG tablet Take 1 tablet (0.5 mg total) by mouth 3 (three) times daily as needed for anxiety.   atorvastatin (LIPITOR) 20 MG tablet Take 1 tablet (20 mg total) by mouth at bedtime.   FLUoxetine (PROZAC) 40 MG capsule Take 1 capsule (40 mg total) by mouth daily.   hydrochlorothiazide (MICROZIDE) 12.5 MG capsule Take 1 capsule (12.5 mg total) by mouth every morning.    irbesartan (AVAPRO) 75 MG tablet Take 1 tablet (75 mg total) by mouth daily.   lansoprazole (PREVACID) 15 MG capsule Take 1 capsule (15 mg total) by mouth daily.   oxyCODONE-acetaminophen (PERCOCET) 7.5-325 MG tablet Take 1 tablet by mouth every 8 (eight) hours as needed.   [START ON 07/07/2021] oxyCODONE-acetaminophen (PERCOCET) 7.5-325 MG tablet Take 1 tablet by mouth every 8 (eight) hours as needed for severe pain or moderate pain.   traZODone (DESYREL) 100 MG tablet Take 1-2 tablets (100-200 mg total) by mouth at bedtime. Takes (2) '100mg'$  tabs every night   No facility-administered encounter medications on file as of 06/09/2021.    Surgical History: Past Surgical History:  Procedure Laterality Date   ANTERIOR FUSION CERVICAL SPINE  1995   finger debridement      Medical History: Past Medical History:  Diagnosis Date   Arthritis    Cyst, kidney, acquired    GERD (gastroesophageal reflux disease)  Hyperlipidemia    Neuromuscular disorder ([place])    Spine degeneration    Substance abuse ([place])     Family History: Family History  Problem Relation Age of Onset   Arthritis Mother    Hypertension Mother    Hyperlipidemia Mother    Anxiety disorder Mother    Depression Father    COPD Father    Alcohol abuse Maternal Uncle    Alcohol abuse Paternal Uncle    Alcohol abuse Paternal Grandfather    Alcohol abuse Cousin     Social History   Socioeconomic History   Marital status: Divorced    Spouse name: Not on file   Number of children: Not on file   Years of education: Not on file   Highest education level: Not on file  Occupational History   Not on file  Tobacco Use   Smoking status: Some Days    Packs/day: 1.00    Years: 36.00    Pack years: 36.00    Types: Cigarettes    Start date: 49    Last attempt to quit: 2021    Years since quitting: 2.3   Smokeless tobacco: Never  Substance and Sexual Activity   Alcohol use: No   Drug use: No    Comment: former  substance abuser - cocaine, pills, marijuana   Sexual activity: Not on file  Other Topics Concern   Not on file  Social History Narrative   Not on file   Social Determinants of Health   Financial Resource Strain: Not on file  Food Insecurity: Not on file  Transportation Needs: Not on file  Physical Activity: Not on file  Stress: Not on file  Social Connections: Not on file  Intimate Partner Violence: Not on file      Review of Systems  Constitutional:  Negative for activity change, appetite change, chills, fatigue, fever and unexpected weight change.  HENT: Negative.  Negative for congestion, ear pain, rhinorrhea, sore throat and trouble swallowing.   Eyes: Negative.   Respiratory: Negative.  Negative for cough, chest tightness, shortness of breath and wheezing.   Cardiovascular: Negative.  Negative for chest pain.  Gastrointestinal: Negative.  Negative for abdominal pain, blood in stool, constipation, diarrhea, nausea and vomiting.  Endocrine: Negative.   Genitourinary: Negative.  Negative for difficulty urinating, dysuria, frequency, hematuria and urgency.  Musculoskeletal: Negative.  Negative for arthralgias, back pain, joint swelling, myalgias and neck pain.  Skin: Negative.  Negative for rash and wound.  Allergic/Immunologic: Negative.  Negative for immunocompromised state.  Neurological: Negative.  Negative for dizziness, seizures, numbness and headaches.  Hematological: Negative.   Psychiatric/Behavioral: Negative.  Negative for behavioral problems, self-injury and suicidal ideas. The patient is not nervous/anxious.    Vital Signs: BP 122/71   Pulse 75   Temp 98 F (36.7 C)   Resp 16   Ht 6' (1.829 m)   Wt 219 lb 6.4 oz (99.5 kg)   SpO2 99%   BMI 29.76 kg/m    Physical Exam Vitals reviewed.  Constitutional:      General: He is awake. He is not in acute distress.    Appearance: Normal appearance. He is well-developed, well-groomed and overweight. He is not  ill-appearing or diaphoretic.  HENT:     Head: Normocephalic and atraumatic.     Right Ear: Tympanic membrane, ear canal and external ear normal.     Left Ear: Tympanic membrane, ear canal and external ear normal.     Nose: Nose normal.  No congestion or rhinorrhea.     Mouth/Throat:     Lips: Pink.     Mouth: Mucous membranes are moist.     Pharynx: Oropharynx is clear. Uvula midline. No oropharyngeal exudate or posterior oropharyngeal erythema.  Eyes:     General: Lids are normal. Vision grossly intact. Gaze aligned appropriately. No scleral icterus.       Right eye: No discharge.        Left eye: No discharge.     Extraocular Movements: Extraocular movements intact.     Conjunctiva/sclera: Conjunctivae normal.     Pupils: Pupils are equal, round, and reactive to light.     Funduscopic exam:    Right eye: Red reflex present.        Left eye: Red reflex present. Neck:     Thyroid: No thyromegaly.     Vascular: No JVD.     Trachea: Trachea and phonation normal. No tracheal deviation.  Cardiovascular:     Rate and Rhythm: Normal rate and regular rhythm.     Pulses: Normal pulses.     Heart sounds: Normal heart sounds, S1 normal and S2 normal. No murmur heard.   No friction rub. No gallop.  Pulmonary:     Effort: Pulmonary effort is normal. No accessory muscle usage or respiratory distress.     Breath sounds: Normal breath sounds and air entry. No stridor. No wheezing or rales.  Chest:     Chest wall: No tenderness.  Abdominal:     General: Bowel sounds are normal. There is no distension.     Palpations: Abdomen is soft. There is no mass.     Tenderness: There is no abdominal tenderness. There is no guarding or rebound.  Musculoskeletal:        General: No tenderness or deformity. Normal range of motion.     Cervical back: Normal range of motion and neck supple.     Right lower leg: No edema.     Left lower leg: No edema.  Lymphadenopathy:     Cervical: No cervical  adenopathy.  Skin:    General: Skin is warm and dry.     Capillary Refill: Capillary refill takes less than 2 seconds.     Coloration: Skin is not pale.     Findings: No erythema or rash.     Comments: Multiple atypical moles noted on back and chest  Neurological:     Mental Status: He is alert and oriented to person, place, and time.     Cranial Nerves: No cranial nerve deficit.     Motor: No abnormal muscle tone.     Coordination: Coordination normal.     Gait: Gait normal.     Deep Tendon Reflexes: Reflexes are normal and symmetric.  Psychiatric:        Mood and Affect: Mood normal.        Behavior: Behavior normal. Behavior is cooperative.        Thought Content: Thought content normal.        Judgment: Judgment normal.       Assessment/Plan: 1. Encounter for routine adult health examination with abnormal findings Age-appropriate preventive screenings and vaccinations discussed, annual physical exam completed. Routine labs for health maintenance previously drawn and results were discussed with the patient today. PHM updated.   2. Essential hypertension Patient has been on his current regimen for hypertension for several years and is working very well for him.  His blood pressure is stable and well-controlled  with irbesartan and hydrochlorothiazide. - irbesartan (AVAPRO) 75 MG tablet; Take 1 tablet (75 mg total) by mouth daily.  Dispense: 90 tablet; Refill: 3 - hydrochlorothiazide (MICROZIDE) 12.5 MG capsule; Take 1 capsule (12.5 mg total) by mouth every morning.  Dispense: 90 capsule; Refill: 3  3. Mixed hyperlipidemia Patient to continue taking atorvastatin due to abnormal cholesterol levels. Patient was also encouraged to limit red meat intake, increase lean protein intake and possibly add an omega-3 fish oil or flaxseed oil supplement if not already doing so.  We will repeat the lipid panel in 6 months. - atorvastatin (LIPITOR) 20 MG tablet; Take 1 tablet (20 mg total) by  mouth at bedtime.  Dispense: 90 tablet; Refill: 3  4. Hypogonadism in male Testosterone level is significantly low, he would like to discuss adding this back at his next office visit.  5. Chronic bilateral low back pain with bilateral sciatica Patient is currently tolerating the decreased frequency of Percocet at every 8 hours as needed.  He tries to take it only twice a day if possible.  We will follow-up in 2 months to discuss medication dose changes and decreasing dose and/or frequency again. - oxyCODONE-acetaminophen (PERCOCET) 7.5-325 MG tablet; Take 1 tablet by mouth every 8 (eight) hours as needed.  Dispense: 90 tablet; Refill: 0 - oxyCODONE-acetaminophen (PERCOCET) 7.5-325 MG tablet; Take 1 tablet by mouth every 8 (eight) hours as needed for severe pain or moderate pain.  Dispense: 90 tablet; Refill: 0  6. Lumbar facet arthropathy See problem #5 - oxyCODONE-acetaminophen (PERCOCET) 7.5-325 MG tablet; Take 1 tablet by mouth every 8 (eight) hours as needed.  Dispense: 90 tablet; Refill: 0 - oxyCODONE-acetaminophen (PERCOCET) 7.5-325 MG tablet; Take 1 tablet by mouth every 8 (eight) hours as needed for severe pain or moderate pain.  Dispense: 90 tablet; Refill: 0  7. Multiple atypical skin moles Dermatology referral ordered, patient would like to have multiple moles removed - Ambulatory referral to Dermatology  8. Vitamin D deficiency Vitamin D level is low normal, patient may take over-the-counter vitamin D supplement of 2000 units daily if desired.  9. Gastroesophageal reflux disease without esophagitis Stable, refills ordered. - lansoprazole (PREVACID) 15 MG capsule; Take 1 capsule (15 mg total) by mouth daily.  Dispense: 90 capsule; Refill: 3  10. Dysuria Routine urinalysis done - UA/M w/rflx Culture, Routine - Microscopic Examination  11. Anxiety and depression Patient has history of anxiety and depression, takes fluoxetine daily and takes trazodone at night to help with  sleep.  Also takes alprazolam and has been tolerating the decreased dose and frequency as noted in the HPI above.  Currently taking alprazolam 0.5 twice daily as needed but may take a third dose as per the order but he tries to save that dose for when he truly needs it.  We will follow-up in a couple of months to see if the patient is ready to decrease the alprazolam dose further and continue to taper off. - ALPRAZolam (XANAX) 0.5 MG tablet; Take 1 tablet (0.5 mg total) by mouth 3 (three) times daily as needed for anxiety.  Dispense: 90 tablet; Refill: 2 - FLUoxetine (PROZAC) 40 MG capsule; Take 1 capsule (40 mg total) by mouth daily.  Dispense: 90 capsule; Refill: 3 - traZODone (DESYREL) 100 MG tablet; Take 1-2 tablets (100-200 mg total) by mouth at bedtime. Takes (2) '100mg'$  tabs every night  Dispense: 180 tablet; Refill: 3      General Counseling: Alandis verbalizes understanding of the findings of todays visit  and agrees with plan of treatment. I have discussed any further diagnostic evaluation that may be needed or ordered today. We also reviewed his medications today. he has been encouraged to call the office with any questions or concerns that should arise related to todays visit.    Orders Placed This Encounter  Procedures   UA/M w/rflx Culture, Routine    Meds ordered this encounter  Medications   oxyCODONE-acetaminophen (PERCOCET) 7.5-325 MG tablet    Sig: Take 1 tablet by mouth every 8 (eight) hours as needed.    Dispense:  90 tablet    Refill:  0   ALPRAZolam (XANAX) 0.5 MG tablet    Sig: Take 1 tablet (0.5 mg total) by mouth 3 (three) times daily as needed for anxiety.    Dispense:  90 tablet    Refill:  2   irbesartan (AVAPRO) 75 MG tablet    Sig: Take 1 tablet (75 mg total) by mouth daily.    Dispense:  90 tablet    Refill:  3   FLUoxetine (PROZAC) 40 MG capsule    Sig: Take 1 capsule (40 mg total) by mouth daily.    Dispense:  90 capsule    Refill:  3   atorvastatin  (LIPITOR) 20 MG tablet    Sig: Take 1 tablet (20 mg total) by mouth at bedtime.    Dispense:  90 tablet    Refill:  3   hydrochlorothiazide (MICROZIDE) 12.5 MG capsule    Sig: Take 1 capsule (12.5 mg total) by mouth every morning.    Dispense:  90 capsule    Refill:  3   traZODone (DESYREL) 100 MG tablet    Sig: Take 1-2 tablets (100-200 mg total) by mouth at bedtime. Takes (2) '100mg'$  tabs every night    Dispense:  180 tablet    Refill:  3   lansoprazole (PREVACID) 15 MG capsule    Sig: Take 1 capsule (15 mg total) by mouth daily.    Dispense:  90 capsule    Refill:  3   oxyCODONE-acetaminophen (PERCOCET) 7.5-325 MG tablet    Sig: Take 1 tablet by mouth every 8 (eight) hours as needed for severe pain or moderate pain.    Dispense:  90 tablet    Refill:  0    Working on decreasing dose.    Return in about 2 months (around 08/09/2021) for F/U, med refill, Tanessa Tidd PCP pain med adjustment.   Total time spent: 30 minutes Time spent includes review of chart, medications, test results, and follow up plan with the patient.   [place] Controlled Substance Database was reviewed by me.  This patient was seen by Jonetta Osgood, FNP-C in collaboration with Dr. Clayborn Bigness as a part of collaborative care agreement.  Yvanna Vidas R. Valetta Fuller, MSN, FNP-C Internal medicine

## 2021-06-10 LAB — UA/M W/RFLX CULTURE, ROUTINE
Bilirubin, UA: NEGATIVE
Glucose, UA: NEGATIVE
Ketones, UA: NEGATIVE
Leukocytes,UA: NEGATIVE
Nitrite, UA: NEGATIVE
Protein,UA: NEGATIVE
RBC, UA: NEGATIVE
Specific Gravity, UA: 1.005 (ref 1.005–1.030)
Urobilinogen, Ur: 0.2 mg/dL (ref 0.2–1.0)
pH, UA: 7.5 (ref 5.0–7.5)

## 2021-06-10 LAB — MICROSCOPIC EXAMINATION
Bacteria, UA: NONE SEEN
Casts: NONE SEEN /lpf
Epithelial Cells (non renal): NONE SEEN /hpf (ref 0–10)
RBC, Urine: NONE SEEN /hpf (ref 0–2)
WBC, UA: NONE SEEN /hpf (ref 0–5)

## 2021-06-10 NOTE — Telephone Encounter (Signed)
Referral rejected. Patient was notified-Toni ?

## 2021-06-22 ENCOUNTER — Telehealth: Payer: Self-pay

## 2021-06-25 NOTE — Telephone Encounter (Signed)
Error

## 2021-07-12 ENCOUNTER — Encounter: Payer: Self-pay | Admitting: Nurse Practitioner

## 2021-08-11 ENCOUNTER — Encounter: Payer: Self-pay | Admitting: Nurse Practitioner

## 2021-08-11 ENCOUNTER — Ambulatory Visit (INDEPENDENT_AMBULATORY_CARE_PROVIDER_SITE_OTHER): Payer: Self-pay | Admitting: Nurse Practitioner

## 2021-08-11 VITALS — BP 127/69 | HR 70 | Temp 98.0°F | Resp 16 | Ht 72.0 in | Wt 222.6 lb

## 2021-08-11 DIAGNOSIS — M5442 Lumbago with sciatica, left side: Secondary | ICD-10-CM | POA: Diagnosis not present

## 2021-08-11 DIAGNOSIS — M47816 Spondylosis without myelopathy or radiculopathy, lumbar region: Secondary | ICD-10-CM

## 2021-08-11 DIAGNOSIS — E291 Testicular hypofunction: Secondary | ICD-10-CM | POA: Diagnosis not present

## 2021-08-11 DIAGNOSIS — M5441 Lumbago with sciatica, right side: Secondary | ICD-10-CM

## 2021-08-11 DIAGNOSIS — F419 Anxiety disorder, unspecified: Secondary | ICD-10-CM

## 2021-08-11 DIAGNOSIS — F32A Depression, unspecified: Secondary | ICD-10-CM

## 2021-08-11 DIAGNOSIS — G8929 Other chronic pain: Secondary | ICD-10-CM | POA: Diagnosis not present

## 2021-08-11 DIAGNOSIS — Z23 Encounter for immunization: Secondary | ICD-10-CM

## 2021-08-11 MED ORDER — "BD SYRINGE/NEEDLE 23G X 1"" 3 ML MISC": 1.0000 | 0 refills | Status: DC

## 2021-08-11 MED ORDER — ZOSTER VAC RECOMB ADJUVANTED 50 MCG/0.5ML IM SUSR: 0.5000 mL | Freq: Once | INTRAMUSCULAR | 0 refills | Status: AC

## 2021-08-11 MED ORDER — TESTOSTERONE CYPIONATE 100 MG/ML IM SOLN: 100.0000 mg | INTRAMUSCULAR | 3 refills | Status: DC

## 2021-08-11 MED ORDER — OXYCODONE-ACETAMINOPHEN 7.5-325 MG PO TABS: 1.0000 | ORAL_TABLET | Freq: Three times a day (TID) | ORAL | 0 refills | Status: DC | PRN

## 2021-08-11 MED ORDER — ALPRAZOLAM 0.5 MG PO TABS: 0.5000 mg | ORAL_TABLET | Freq: Three times a day (TID) | ORAL | 2 refills | Status: DC | PRN

## 2021-08-11 NOTE — Progress Notes (Signed)
[facility], [place] [address]  Internal MEDICINE  Office Visit Note  Patient Name: Nathan Meyer  [id number]  [id number]  Date of Service: 08/11/2021  Chief Complaint  Patient presents with   Follow-up    Discuss meds and soma    Gastroesophageal Reflux   Hyperlipidemia   Medication Refill    HPI Nathan Meyer presents for follow-up visit for medication review,, chronic pain, muscle spasms and anxiety, and refills.  He is currently taking 3 Percocet tablets per day, 3 alprazolam tablets per day and he might take carisoprodol once a week.  At his previous office visit in April he had stopped smoking cigarettes and was using nicotine patches.  He is no longer using nicotine patches and is still not smoking.  He does state that he does have some cravings now and then but he is able to resist those cravings.     Current Medication: Outpatient Encounter Medications as of 08/11/2021  Medication Sig   atorvastatin (LIPITOR) 20 MG tablet Take 1 tablet (20 mg total) by mouth at bedtime.   carisoprodol (SOMA) 350 MG tablet Take 350 mg by mouth 2 (two) times daily.   Cholecalciferol (VITAMIN D3 PO) Take by mouth.   Cyanocobalamin (B-12 PO) Take by mouth.   FLUoxetine (PROZAC) 40 MG capsule Take 1 capsule (40 mg total) by mouth daily.   hydrochlorothiazide (MICROZIDE) 12.5 MG capsule Take 1 capsule (12.5 mg total) by mouth every morning.   irbesartan (AVAPRO) 75 MG tablet Take 1 tablet (75 mg total) by mouth daily.   lansoprazole (PREVACID) 15 MG capsule Take 1 capsule (15 mg total) by mouth daily.   Multiple Vitamin (MULTIVITAMIN ADULT PO) Take by mouth.   SYRINGE-NEEDLE, DISP, 3 ML (B-D SYRINGE/NEEDLE 3CC/23GX1") 23G X 1" 3 ML MISC 1 Device by Does not apply route every 14 (fourteen) days. To be used for IM testosterone injection.   testosterone cypionate (DEPOTESTOTERONE CYPIONATE) 100 MG/ML injection Inject 1 mL (100 mg total) into the muscle every 14  (fourteen) days. For IM use only   traZODone (DESYREL) 100 MG tablet Take 1-2 tablets (100-200 mg total) by mouth at bedtime. Takes (2) '100mg'$  tabs every night   [EXPIRED] Zoster Vaccine Adjuvanted [facility]) injection Inject 0.5 mLs into the muscle once for 1 dose.   [DISCONTINUED] ALPRAZolam (XANAX) 0.5 MG tablet Take 1 tablet (0.5 mg total) by mouth 3 (three) times daily as needed for anxiety.   [DISCONTINUED] oxyCODONE-acetaminophen (PERCOCET) 7.5-325 MG tablet Take 1 tablet by mouth every 8 (eight) hours as needed.   [DISCONTINUED] oxyCODONE-acetaminophen (PERCOCET) 7.5-325 MG tablet Take 1 tablet by mouth every 8 (eight) hours as needed for severe pain or moderate pain.   ALPRAZolam (XANAX) 0.5 MG tablet Take 1 tablet (0.5 mg total) by mouth 3 (three) times daily as needed for anxiety.   oxyCODONE-acetaminophen (PERCOCET) 7.5-325 MG tablet Take 1 tablet by mouth every 8 (eight) hours as needed.   oxyCODONE-acetaminophen (PERCOCET) 7.5-325 MG tablet Take 1 tablet by mouth every 8 (eight) hours as needed for moderate pain or severe pain.   [START ON 10/06/2021] oxyCODONE-acetaminophen (PERCOCET) 7.5-325 MG tablet Take 1 tablet by mouth every 8 (eight) hours as needed for moderate pain or severe pain.   No facility-administered encounter medications on file as of 08/11/2021.    Surgical History: Past Surgical History:  Procedure Laterality Date   ANTERIOR FUSION CERVICAL SPINE  1995   finger debridement      Medical History: Past  Medical History:  Diagnosis Date   Arthritis    Cyst, kidney, acquired    GERD (gastroesophageal reflux disease)    Hyperlipidemia    Neuromuscular disorder (HCC)    Spine degeneration    Substance abuse ([place])     Family History: Family History  Problem Relation Age of Onset   Arthritis Mother    Hypertension Mother    Hyperlipidemia Mother    Anxiety disorder Mother    Depression Father    COPD Father    Alcohol abuse Maternal Uncle    Alcohol  abuse Paternal Uncle    Alcohol abuse Paternal Grandfather    Alcohol abuse Cousin     Social History   Socioeconomic History   Marital status: Divorced    Spouse name: Not on file   Number of children: Not on file   Years of education: Not on file   Highest education level: Not on file  Occupational History   Not on file  Tobacco Use   Smoking status: Former    Packs/day: 1.00    Years: 36.00    Total pack years: 36.00    Types: Cigarettes    Start date: 24    Quit date: 05/29/2021    Years since quitting: 0.3   Smokeless tobacco: Never  Substance and Sexual Activity   Alcohol use: No   Drug use: No    Comment: former substance abuser - cocaine, pills, marijuana   Sexual activity: Not on file  Other Topics Concern   Not on file  Social History Narrative   Not on file   Social Determinants of Health   Financial Resource Strain: Not on file  Food Insecurity: Not on file  Transportation Needs: Not on file  Physical Activity: Not on file  Stress: Not on file  Social Connections: Not on file  Intimate Partner Violence: Not on file      Review of Systems  Constitutional:  Negative for chills, fatigue and unexpected weight change.  HENT:  Negative for congestion, rhinorrhea, sneezing and sore throat.   Eyes:  Negative for redness.  Respiratory:  Negative for cough, chest tightness and shortness of breath.   Cardiovascular:  Negative for chest pain and palpitations.  Gastrointestinal:  Negative for abdominal pain, constipation, diarrhea, nausea and vomiting.  Genitourinary:  Negative for dysuria and frequency.  Musculoskeletal:  Negative for arthralgias, back pain, joint swelling and neck pain.  Skin:  Negative for rash.  Neurological: Negative.  Negative for tremors and numbness.  Hematological:  Negative for adenopathy. Does not bruise/bleed easily.  Psychiatric/Behavioral:  Negative for behavioral problems (Depression), sleep disturbance and suicidal ideas.  The patient is not nervous/anxious.     Vital Signs: BP 127/69   Pulse 70   Temp 98 F (36.7 C)   Resp 16   Ht 6' (1.829 m)   Wt 222 lb 9.6 oz (101 kg)   SpO2 98%   BMI 30.19 kg/m    Physical Exam Vitals reviewed.  Constitutional:      General: He is not in acute distress.    Appearance: Normal appearance. He is obese. He is not ill-appearing.  HENT:     Head: Normocephalic and atraumatic.  Eyes:     Pupils: Pupils are equal, round, and reactive to light.  Cardiovascular:     Rate and Rhythm: Normal rate and regular rhythm.  Pulmonary:     Effort: Pulmonary effort is normal. No respiratory distress.  Neurological:  Mental Status: He is alert and oriented to person, place, and time.  Psychiatric:        Mood and Affect: Mood normal.        Behavior: Behavior normal.        Assessment/Plan: 1. Lumbar facet arthropathy Stable and pain is tolerable with current dose of percocet, will gradually continue to wean down on dose but patient is doing well at the current dose - oxyCODONE-acetaminophen (PERCOCET) 7.5-325 MG tablet; Take 1 tablet by mouth every 8 (eight) hours as needed.  Dispense: 90 tablet; Refill: 0 - oxyCODONE-acetaminophen (PERCOCET) 7.5-325 MG tablet; Take 1 tablet by mouth every 8 (eight) hours as needed for moderate pain or severe pain.  Dispense: 90 tablet; Refill: 0 - oxyCODONE-acetaminophen (PERCOCET) 7.5-325 MG tablet; Take 1 tablet by mouth every 8 (eight) hours as needed for moderate pain or severe pain.  Dispense: 90 tablet; Refill: 0  2. Chronic bilateral low back pain with bilateral sciatica See problem #1 - oxyCODONE-acetaminophen (PERCOCET) 7.5-325 MG tablet; Take 1 tablet by mouth every 8 (eight) hours as needed.  Dispense: 90 tablet; Refill: 0 - oxyCODONE-acetaminophen (PERCOCET) 7.5-325 MG tablet; Take 1 tablet by mouth every 8 (eight) hours as needed for moderate pain or severe pain.  Dispense: 90 tablet; Refill: 0 -  oxyCODONE-acetaminophen (PERCOCET) 7.5-325 MG tablet; Take 1 tablet by mouth every 8 (eight) hours as needed for moderate pain or severe pain.  Dispense: 90 tablet; Refill: 0  3. Hypogonadism in male Significantly low testosterone level 173 and low free testosterone 1.8. order to pharmacy for testosterone injections every 14 days.  - testosterone cypionate (DEPOTESTOTERONE CYPIONATE) 100 MG/ML injection; Inject 1 mL (100 mg total) into the muscle every 14 (fourteen) days. For IM use only  Dispense: 10 mL; Refill: 3 - SYRINGE-NEEDLE, DISP, 3 ML (B-D SYRINGE/NEEDLE 3CC/23GX1") 23G X 1" 3 ML MISC; 1 Device by Does not apply route every 14 (fourteen) days. To be used for IM testosterone injection.  Dispense: 50 each; Refill: 0  4. Need for vaccination - Zoster Vaccine Adjuvanted [facility]) injection; Inject 0.5 mLs into the muscle once for 1 dose.  Dispense: 0.5 mL; Refill: 0  5. Anxiety and depression Stable on current dose, mild withdrawal symptoms from weaning down on dose have subsided.  - ALPRAZolam (XANAX) 0.5 MG tablet; Take 1 tablet (0.5 mg total) by mouth 3 (three) times daily as needed for anxiety.  Dispense: 90 tablet; Refill: 2   General Counseling: Lessie verbalizes understanding of the findings of todays visit and agrees with plan of treatment. I have discussed any further diagnostic evaluation that may be needed or ordered today. We also reviewed his medications today. he has been encouraged to call the office with any questions or concerns that should arise related to todays visit.    No orders of the defined types were placed in this encounter.   Meds ordered this encounter  Medications   testosterone cypionate (DEPOTESTOTERONE CYPIONATE) 100 MG/ML injection    Sig: Inject 1 mL (100 mg total) into the muscle every 14 (fourteen) days. For IM use only    Dispense:  10 mL    Refill:  3    Please fill prescription with syringes/needles   SYRINGE-NEEDLE, DISP, 3 ML (B-D  SYRINGE/NEEDLE 3CC/23GX1") 23G X 1" 3 ML MISC    Sig: 1 Device by Does not apply route every 14 (fourteen) days. To be used for IM testosterone injection.    Dispense:  50 each  Refill:  0    Please send 1 box, no refills for now with first fill of testosterone cypionate   ALPRAZolam (XANAX) 0.5 MG tablet    Sig: Take 1 tablet (0.5 mg total) by mouth 3 (three) times daily as needed for anxiety.    Dispense:  90 tablet    Refill:  2   oxyCODONE-acetaminophen (PERCOCET) 7.5-325 MG tablet    Sig: Take 1 tablet by mouth every 8 (eight) hours as needed.    Dispense:  90 tablet    Refill:  0   oxyCODONE-acetaminophen (PERCOCET) 7.5-325 MG tablet    Sig: Take 1 tablet by mouth every 8 (eight) hours as needed for moderate pain or severe pain.    Dispense:  90 tablet    Refill:  0    Fill for July   oxyCODONE-acetaminophen (PERCOCET) 7.5-325 MG tablet    Sig: Take 1 tablet by mouth every 8 (eight) hours as needed for moderate pain or severe pain.    Dispense:  90 tablet    Refill:  0    Fill for august   Zoster Vaccine Adjuvanted [facility]) injection    Sig: Inject 0.5 mLs into the muscle once for 1 dose.    Dispense:  0.5 mL    Refill:  0    Will need second dose after 2 months    Return in about 3 months (around 11/11/2021) for F/U, med refill, Michaeljoseph Revolorio PCP will need UDS at next office visit. .   Total time spent:30 Minutes Time spent includes review of chart, medications, test results, and follow up plan with the patient.    Controlled Substance Database was reviewed by me.  This patient was seen by Jonetta Osgood, FNP-C in collaboration with Dr. Clayborn Bigness as a part of collaborative care agreement.   Tacey Dimaggio R. Valetta Fuller, MSN, FNP-C Internal medicine

## 2021-09-27 ENCOUNTER — Encounter: Payer: Self-pay | Admitting: Nurse Practitioner

## 2021-10-22 ENCOUNTER — Other Ambulatory Visit: Payer: Self-pay | Admitting: Nurse Practitioner

## 2021-10-22 DIAGNOSIS — M47816 Spondylosis without myelopathy or radiculopathy, lumbar region: Secondary | ICD-10-CM

## 2021-10-22 DIAGNOSIS — G8929 Other chronic pain: Secondary | ICD-10-CM

## 2021-10-22 NOTE — Telephone Encounter (Signed)
Please refused

## 2021-10-24 NOTE — Telephone Encounter (Signed)
Patient still has 1 refill, will order additional refills at his scheduled office visit on 9/25

## 2021-11-08 ENCOUNTER — Ambulatory Visit (INDEPENDENT_AMBULATORY_CARE_PROVIDER_SITE_OTHER): Payer: Self-pay | Admitting: Nurse Practitioner

## 2021-11-08 ENCOUNTER — Encounter: Payer: Self-pay | Admitting: Nurse Practitioner

## 2021-11-08 VITALS — BP 126/74 | HR 68 | Temp 98.3°F | Resp 16 | Ht 72.0 in | Wt 227.4 lb

## 2021-11-08 DIAGNOSIS — Z79899 Other long term (current) drug therapy: Secondary | ICD-10-CM

## 2021-11-08 DIAGNOSIS — Z5181 Encounter for therapeutic drug level monitoring: Secondary | ICD-10-CM

## 2021-11-08 DIAGNOSIS — Z23 Encounter for immunization: Secondary | ICD-10-CM

## 2021-11-08 DIAGNOSIS — Z760 Encounter for issue of repeat prescription: Secondary | ICD-10-CM

## 2021-11-08 DIAGNOSIS — G8929 Other chronic pain: Secondary | ICD-10-CM

## 2021-11-08 LAB — POCT URINE DRUG SCREEN
POC Amphetamine UR: NOT DETECTED
POC BENZODIAZEPINES UR: POSITIVE — AB
POC Barbiturate UR: NOT DETECTED
POC Cocaine UR: NOT DETECTED
POC Ecstasy UR: NOT DETECTED
POC Marijuana UR: NOT DETECTED
POC Methadone UR: NOT DETECTED
POC Methamphetamine UR: NOT DETECTED
POC Opiate Ur: NOT DETECTED
POC Oxycodone UR: POSITIVE — AB
POC PHENCYCLIDINE UR: NOT DETECTED
POC TRICYCLICS UR: NOT DETECTED

## 2021-11-08 MED ORDER — "BD SYRINGE/NEEDLE 23G X 1"" 3 ML MISC": 1.0000 | 0 refills | Status: DC

## 2021-11-08 MED ORDER — ALPRAZOLAM 0.5 MG PO TABS: 0.5000 mg | ORAL_TABLET | Freq: Three times a day (TID) | ORAL | 2 refills | Status: DC | PRN

## 2021-11-08 MED ORDER — TIZANIDINE HCL 2 MG PO TABS: 2.0000 mg | ORAL_TABLET | Freq: Four times a day (QID) | ORAL | 1 refills | Status: DC | PRN

## 2021-11-08 MED ORDER — OXYCODONE-ACETAMINOPHEN 7.5-325 MG PO TABS: 1.0000 | ORAL_TABLET | Freq: Three times a day (TID) | ORAL | 0 refills | Status: DC | PRN

## 2021-11-08 NOTE — Progress Notes (Signed)
[facility] [place], [place] [address]  Internal MEDICINE  Office Visit Note  Patient Name: Nathan Meyer  [id number]  [id number]  Date of Service: 11/08/2021  Chief Complaint  Patient presents with   Follow-up    Follow up med refill    Gastroesophageal Reflux   Hyperlipidemia    HPI Nathan Meyer presents for a follow up visit  --sciatica has been bothering him more. Taking 3 percocet per day and maybe 4 a couple of days.  --mother has afib and has had multiple strokes. His stress level is high and he is helping  -- has not had any Soma in about 3 weeks, we have previously discussed not refilling Soma and switching to a muscle relaxant that is not a controlled substance. --taking fluoxetine for anxiety and depression, has alprazolam TID prn for anxiety as well.  --has been taking testosterone injections every 2 weeks for 3 months now.  --also requesting flu shot.   Current Medication: Outpatient Encounter Medications as of 11/08/2021  Medication Sig   atorvastatin (LIPITOR) 20 MG tablet Take 1 tablet (20 mg total) by mouth at bedtime.   carisoprodol (SOMA) 350 MG tablet Take 350 mg by mouth 2 (two) times daily.   Cholecalciferol (VITAMIN D3 PO) Take by mouth.   Cyanocobalamin (B-12 PO) Take by mouth.   FLUoxetine (PROZAC) 40 MG capsule Take 1 capsule (40 mg total) by mouth daily.   hydrochlorothiazide (MICROZIDE) 12.5 MG capsule Take 1 capsule (12.5 mg total) by mouth every morning.   irbesartan (AVAPRO) 75 MG tablet Take 1 tablet (75 mg total) by mouth daily.   lansoprazole (PREVACID) 15 MG capsule Take 1 capsule (15 mg total) by mouth daily.   Multiple Vitamin (MULTIVITAMIN ADULT PO) Take by mouth.   testosterone cypionate (DEPOTESTOTERONE CYPIONATE) 100 MG/ML injection Inject 1 mL (100 mg total) into the muscle every 14 (fourteen) days. For IM use only   tiZANidine (ZANAFLEX) 2 MG tablet Take 1 tablet (2 mg total) by mouth every 6 (six) hours as  needed for muscle spasms. No more than 4 tablets per 24 hours.   traZODone (DESYREL) 100 MG tablet Take 1-2 tablets (100-200 mg total) by mouth at bedtime. Takes (2) '100mg'$  tabs every night   [DISCONTINUED] ALPRAZolam (XANAX) 0.5 MG tablet Take 1 tablet (0.5 mg total) by mouth 3 (three) times daily as needed for anxiety.   [DISCONTINUED] oxyCODONE-acetaminophen (PERCOCET) 7.5-325 MG tablet Take 1 tablet by mouth every 8 (eight) hours as needed.   [DISCONTINUED] oxyCODONE-acetaminophen (PERCOCET) 7.5-325 MG tablet Take 1 tablet by mouth every 8 (eight) hours as needed for moderate pain or severe pain.   [DISCONTINUED] oxyCODONE-acetaminophen (PERCOCET) 7.5-325 MG tablet Take 1 tablet by mouth every 8 (eight) hours as needed for moderate pain or severe pain.   [DISCONTINUED] SYRINGE-NEEDLE, DISP, 3 ML (B-D SYRINGE/NEEDLE 3CC/23GX1") 23G X 1" 3 ML MISC 1 Device by Does not apply route every 14 (fourteen) days. To be used for IM testosterone injection.   ALPRAZolam (XANAX) 0.5 MG tablet Take 1 tablet (0.5 mg total) by mouth 3 (three) times daily as needed for anxiety.   oxyCODONE-acetaminophen (PERCOCET) 7.5-325 MG tablet Take 1 tablet by mouth every 8 (eight) hours as needed.   [START ON 12/06/2021] oxyCODONE-acetaminophen (PERCOCET) 7.5-325 MG tablet Take 1 tablet by mouth every 8 (eight) hours as needed for moderate pain or severe pain.   [START ON 01/03/2022] oxyCODONE-acetaminophen (PERCOCET) 7.5-325 MG tablet Take 1 tablet by mouth every 8 (eight)  hours as needed for moderate pain or severe pain.   SYRINGE-NEEDLE, DISP, 3 ML (B-D SYRINGE/NEEDLE 3CC/23GX1") 23G X 1" 3 ML MISC 1 Device by Does not apply route every 14 (fourteen) days. To be used for IM testosterone injection.   No facility-administered encounter medications on file as of 11/08/2021.    Surgical History: Past Surgical History:  Procedure Laterality Date   ANTERIOR FUSION CERVICAL SPINE  1995   finger debridement      Medical  History: Past Medical History:  Diagnosis Date   Arthritis    Cyst, kidney, acquired    GERD (gastroesophageal reflux disease)    Hyperlipidemia    Neuromuscular disorder ([place])    Spine degeneration    Substance abuse ([place])     Family History: Family History  Problem Relation Age of Onset   Arthritis Mother    Hypertension Mother    Hyperlipidemia Mother    Anxiety disorder Mother    Depression Father    COPD Father    Alcohol abuse Maternal Uncle    Alcohol abuse Paternal Uncle    Alcohol abuse Paternal Grandfather    Alcohol abuse Cousin     Social History   Socioeconomic History   Marital status: Divorced    Spouse name: Not on file   Number of children: Not on file   Years of education: Not on file   Highest education level: Not on file  Occupational History   Not on file  Tobacco Use   Smoking status: Former    Packs/day: 1.00    Years: 36.00    Total pack years: 36.00    Types: Cigarettes    Start date: 26    Quit date: 05/29/2021    Years since quitting: 0.4   Smokeless tobacco: Never  Substance and Sexual Activity   Alcohol use: No   Drug use: No    Comment: former substance abuser - cocaine, pills, marijuana   Sexual activity: Not on file  Other Topics Concern   Not on file  Social History Narrative   Not on file   Social Determinants of Health   Financial Resource Strain: Not on file  Food Insecurity: Not on file  Transportation Needs: Not on file  Physical Activity: Not on file  Stress: Not on file  Social Connections: Not on file  Intimate Partner Violence: Not on file      Review of Systems  Constitutional:  Negative for chills, fatigue and unexpected weight change.  HENT:  Negative for congestion, rhinorrhea, sneezing and sore throat.   Eyes:  Negative for redness.  Respiratory: Negative.  Negative for cough, chest tightness, shortness of breath and wheezing.   Cardiovascular: Negative.  Negative for chest pain and  palpitations.  Gastrointestinal:  Negative for abdominal pain, constipation, diarrhea, nausea and vomiting.  Genitourinary:  Negative for dysuria and frequency.  Musculoskeletal:  Positive for arthralgias and back pain. Negative for joint swelling and neck pain.  Neurological: Negative.  Negative for tremors and numbness.  Hematological:  Negative for adenopathy. Does not bruise/bleed easily.  Psychiatric/Behavioral:  Negative for behavioral problems (Depression), sleep disturbance and suicidal ideas. The patient is not nervous/anxious.     Vital Signs: BP 126/74   Pulse 68   Temp 98.3 F (36.8 C)   Resp 16   Ht 6' (1.829 m)   Wt 227 lb 6.4 oz (103.1 kg)   SpO2 95%   BMI 30.84 kg/m    Physical Exam Vitals reviewed.  Constitutional:      General: He is not in acute distress.    Appearance: Normal appearance. He is obese. He is not ill-appearing.  HENT:     Head: Normocephalic and atraumatic.  Eyes:     Pupils: Pupils are equal, round, and reactive to light.  Cardiovascular:     Rate and Rhythm: Normal rate and regular rhythm.  Pulmonary:     Effort: Pulmonary effort is normal. No respiratory distress.  Neurological:     Mental Status: He is alert and oriented to person, place, and time.  Psychiatric:        Mood and Affect: Mood normal.        Behavior: Behavior normal.        Assessment/Plan: 1. Chronic bilateral low back pain with bilateral sciatica UDS done, see problem #2. Refills x3 months ordered, must follow up in person in 3 months for additional refills. Due to increased sciatica, tizanidine added to help alleviate pain and help decrease the need for additional oxycodone. Follow up in 4 weeks to discuss tizanidine. If tizanidine not effective, will discuss trying gabapentin next.  - oxyCODONE-acetaminophen (PERCOCET) 7.5-325 MG tablet; Take 1 tablet by mouth every 8 (eight) hours as needed.  Dispense: 90 tablet; Refill: 0 - oxyCODONE-acetaminophen (PERCOCET)  7.5-325 MG tablet; Take 1 tablet by mouth every 8 (eight) hours as needed for moderate pain or severe pain.  Dispense: 90 tablet; Refill: 0 - oxyCODONE-acetaminophen (PERCOCET) 7.5-325 MG tablet; Take 1 tablet by mouth every 8 (eight) hours as needed for moderate pain or severe pain.  Dispense: 90 tablet; Refill: 0 - tiZANidine (ZANAFLEX) 2 MG tablet; Take 1 tablet (2 mg total) by mouth every 6 (six) hours as needed for muscle spasms. No more than 4 tablets per 24 hours.  Dispense: 120 tablet; Refill: 1  2. Encounter for long-term current use of high risk medication UDS done today, positive for benzos and oxycodone. This is consistent with his prescriptions. Will repeat UDS in 6 months, but will follow up for med refills in 3 months.  Reviewed risks and possible side effects associated with taking opiates, benzodiazepines and other CNS depressants. Combination of these could cause dizziness and drowsiness. Advised patient not to drive or operate machinery when taking these medications until he knows how they affect him, as patient's and other's lives can be at risk. Patient verbalized understanding of this matter. Dependence and abuse for these drugs will be monitored closely. A Controlled substance policy and procedure is on file which allows [facility] to order a urine drug screen test at any visit. Patient understands and agrees with the plan. - POCT Urine Drug Screen  3. Encounter for monitoring testosterone replacement therapy Has been on testosterone injections for 3 months, labs ordered for monitoring of therapy. It is important to check lipids, liver function, H&H, and testosterone levels periodically.  - Hepatic function panel - Hemoglobin and hematocrit, blood - Lipid Profile - Testosterone,Free and Total  4. Needs flu shot Administered in office today - Flu Vaccine MDCK QUAD PF  5. Medication refill - ALPRAZolam (XANAX) 0.5 MG tablet; Take 1 tablet (0.5 mg total) by mouth  3 (three) times daily as needed for anxiety.  Dispense: 90 tablet; Refill: 2 - SYRINGE-NEEDLE, DISP, 3 ML (B-D SYRINGE/NEEDLE 3CC/23GX1") 23G X 1" 3 ML MISC; 1 Device by Does not apply route every 14 (fourteen) days. To be used for IM testosterone injection.  Dispense: 50 each; Refill: 0 - oxyCODONE-acetaminophen (PERCOCET) 7.5-325  MG tablet; Take 1 tablet by mouth every 8 (eight) hours as needed.  Dispense: 90 tablet; Refill: 0 - oxyCODONE-acetaminophen (PERCOCET) 7.5-325 MG tablet; Take 1 tablet by mouth every 8 (eight) hours as needed for moderate pain or severe pain.  Dispense: 90 tablet; Refill: 0 - oxyCODONE-acetaminophen (PERCOCET) 7.5-325 MG tablet; Take 1 tablet by mouth every 8 (eight) hours as needed for moderate pain or severe pain.  Dispense: 90 tablet; Refill: 0   General Counseling: Nathan Meyer verbalizes understanding of the findings of todays visit and agrees with plan of treatment. I have discussed any further diagnostic evaluation that may be needed or ordered today. We also reviewed his medications today. he has been encouraged to call the office with any questions or concerns that should arise related to todays visit.    Orders Placed This Encounter  Procedures   Hepatic function panel   Hemoglobin and hematocrit, blood   Lipid Profile   Testosterone,Free and Total   POCT Urine Drug Screen    Meds ordered this encounter  Medications   ALPRAZolam (XANAX) 0.5 MG tablet    Sig: Take 1 tablet (0.5 mg total) by mouth 3 (three) times daily as needed for anxiety.    Dispense:  90 tablet    Refill:  2   SYRINGE-NEEDLE, DISP, 3 ML (B-D SYRINGE/NEEDLE 3CC/23GX1") 23G X 1" 3 ML MISC    Sig: 1 Device by Does not apply route every 14 (fourteen) days. To be used for IM testosterone injection.    Dispense:  50 each    Refill:  0    Do not fill until patient calls to request it   oxyCODONE-acetaminophen (PERCOCET) 7.5-325 MG tablet    Sig: Take 1 tablet by mouth every 8 (eight) hours  as needed.    Dispense:  90 tablet    Refill:  0    Fill for 9/25   oxyCODONE-acetaminophen (PERCOCET) 7.5-325 MG tablet    Sig: Take 1 tablet by mouth every 8 (eight) hours as needed for moderate pain or severe pain.    Dispense:  90 tablet    Refill:  0    Fill for 10/23   oxyCODONE-acetaminophen (PERCOCET) 7.5-325 MG tablet    Sig: Take 1 tablet by mouth every 8 (eight) hours as needed for moderate pain or severe pain.    Dispense:  90 tablet    Refill:  0    Fill for 11/20   tiZANidine (ZANAFLEX) 2 MG tablet    Sig: Take 1 tablet (2 mg total) by mouth every 6 (six) hours as needed for muscle spasms. No more than 4 tablets per 24 hours.    Dispense:  120 tablet    Refill:  1    New script, fill today    Return in about 4 weeks (around 12/06/2021) for F/U, eval new med, [place] PCP.   Total time spent:30 Minutes Time spent includes review of chart, medications, test results, and follow up plan with the patient.   [place] Controlled Substance Database was reviewed by me.  This patient was seen by Jonetta Osgood, FNP-C in collaboration with Dr. Clayborn Bigness as a part of collaborative care agreement.   Lunah Losasso R. Valetta Fuller, MSN, FNP-C Internal medicine

## 2021-11-09 DIAGNOSIS — Z79890 Hormone replacement therapy (postmenopausal): Secondary | ICD-10-CM | POA: Diagnosis not present

## 2021-11-09 DIAGNOSIS — E291 Testicular hypofunction: Secondary | ICD-10-CM | POA: Diagnosis not present

## 2021-11-09 DIAGNOSIS — Z5181 Encounter for therapeutic drug level monitoring: Secondary | ICD-10-CM | POA: Diagnosis not present

## 2021-11-10 ENCOUNTER — Telehealth: Payer: Self-pay

## 2021-11-10 NOTE — Telephone Encounter (Signed)
Spoke with patient regarding lab results and to stop Testosterone injections for 1-2 months per Alyssa.

## 2021-11-10 NOTE — Telephone Encounter (Signed)
-----   Message from Jonetta Osgood, NP sent at 11/10/2021  8:24 AM EDT ----- Please call patient and have him stop testosterone injections for now. His level went from being very low to now being too high. We will need to wait 1-2 months check the level again and then see if we need to restart the injections. The other labs l ook good, and cholesterol is improving.  thanks

## 2021-11-10 NOTE — Progress Notes (Signed)
Please call patient and have him stop testosterone injections for now. His level went from being very low to now being too high. We will need to wait 1-2 months check the level again and then see if we need to restart the injections. The other labs look good, and cholesterol is improving.  thanks

## 2021-11-16 LAB — HEPATIC FUNCTION PANEL
ALT: 35 IU/L (ref 0–44)
AST: 22 IU/L (ref 0–40)
Albumin: 5 g/dL — ABNORMAL HIGH (ref 3.9–4.9)
Alkaline Phosphatase: 81 IU/L (ref 44–121)
Bilirubin Total: 0.7 mg/dL (ref 0.0–1.2)
Bilirubin, Direct: 0.16 mg/dL (ref 0.00–0.40)
Total Protein: 7.7 g/dL (ref 6.0–8.5)

## 2021-11-16 LAB — LIPID PANEL
Chol/HDL Ratio: 4.3 ratio (ref 0.0–5.0)
Cholesterol, Total: 149 mg/dL (ref 100–199)
HDL: 35 mg/dL — ABNORMAL LOW (ref >39)
LDL Chol Calc (NIH): 81 mg/dL (ref 0–99)
Triglycerides: 191 mg/dL — ABNORMAL HIGH (ref 0–149)
VLDL Cholesterol Cal: 33 mg/dL (ref 5–40)

## 2021-11-16 LAB — HEMOGLOBIN AND HEMATOCRIT, BLOOD
Hematocrit: 46.6 % (ref 37.5–51.0)
Hemoglobin: 16.2 g/dL (ref 13.0–17.7)

## 2021-11-16 LAB — TESTOSTERONE,FREE AND TOTAL
Testosterone, Free: 15.4 pg/mL (ref 6.6–18.1)
Testosterone: 1138 ng/dL — ABNORMAL HIGH (ref 264–916)

## 2021-12-06 ENCOUNTER — Encounter: Payer: Self-pay | Admitting: Nurse Practitioner

## 2021-12-07 ENCOUNTER — Encounter: Payer: Self-pay | Admitting: Nurse Practitioner

## 2021-12-07 ENCOUNTER — Ambulatory Visit (INDEPENDENT_AMBULATORY_CARE_PROVIDER_SITE_OTHER): Payer: Self-pay | Admitting: Nurse Practitioner

## 2021-12-07 VITALS — BP 107/64 | HR 88 | Temp 98.4°F | Resp 16 | Ht 72.0 in | Wt 222.2 lb

## 2021-12-07 DIAGNOSIS — G8929 Other chronic pain: Secondary | ICD-10-CM | POA: Diagnosis not present

## 2021-12-07 DIAGNOSIS — M5441 Lumbago with sciatica, right side: Secondary | ICD-10-CM

## 2021-12-07 DIAGNOSIS — E782 Mixed hyperlipidemia: Secondary | ICD-10-CM | POA: Diagnosis not present

## 2021-12-07 DIAGNOSIS — M5442 Lumbago with sciatica, left side: Secondary | ICD-10-CM

## 2021-12-07 DIAGNOSIS — K219 Gastro-esophageal reflux disease without esophagitis: Secondary | ICD-10-CM

## 2021-12-07 NOTE — Progress Notes (Signed)
[facility] [place], [place] [address]  Internal MEDICINE  Office Visit Note  Patient Name: Nathan Meyer  [id number]  [id number]  Date of Service: 12/07/2021  Chief Complaint  Patient presents with   Follow-up    Follow up new med.   Gastroesophageal Reflux   Hyperlipidemia    HPI Devrin presents for a follow up visit for chronic pain, GERD, and high cholesterol.  --trying new muscle relaxant, helping some.  --Needs to do stretches more that he learned from physical therapy. And plans to start a daily routine where he does them in the morning and then 1-2 more times later in the day.   --takes atorvastatin for cholesterol.  --acid reflux is controlled.    Current Medication: Outpatient Encounter Medications as of 12/07/2021  Medication Sig   ALPRAZolam (XANAX) 0.5 MG tablet Take 1 tablet (0.5 mg total) by mouth 3 (three) times daily as needed for anxiety.   atorvastatin (LIPITOR) 20 MG tablet Take 1 tablet (20 mg total) by mouth at bedtime.   carisoprodol (SOMA) 350 MG tablet Take 350 mg by mouth 2 (two) times daily.   Cholecalciferol (VITAMIN D3 PO) Take by mouth.   Cyanocobalamin (B-12 PO) Take by mouth.   FLUoxetine (PROZAC) 40 MG capsule Take 1 capsule (40 mg total) by mouth daily.   hydrochlorothiazide (MICROZIDE) 12.5 MG capsule Take 1 capsule (12.5 mg total) by mouth every morning.   irbesartan (AVAPRO) 75 MG tablet Take 1 tablet (75 mg total) by mouth daily.   lansoprazole (PREVACID) 15 MG capsule Take 1 capsule (15 mg total) by mouth daily.   Multiple Vitamin (MULTIVITAMIN ADULT PO) Take by mouth.   oxyCODONE-acetaminophen (PERCOCET) 7.5-325 MG tablet Take 1 tablet by mouth every 8 (eight) hours as needed.   oxyCODONE-acetaminophen (PERCOCET) 7.5-325 MG tablet Take 1 tablet by mouth every 8 (eight) hours as needed for moderate pain or severe pain.   [START ON 01/03/2022] oxyCODONE-acetaminophen (PERCOCET) 7.5-325 MG tablet Take 1 tablet  by mouth every 8 (eight) hours as needed for moderate pain or severe pain.   SYRINGE-NEEDLE, DISP, 3 ML (B-D SYRINGE/NEEDLE 3CC/23GX1") 23G X 1" 3 ML MISC 1 Device by Does not apply route every 14 (fourteen) days. To be used for IM testosterone injection.   tiZANidine (ZANAFLEX) 2 MG tablet Take 1 tablet (2 mg total) by mouth every 6 (six) hours as needed for muscle spasms. No more than 4 tablets per 24 hours.   traZODone (DESYREL) 100 MG tablet Take 1-2 tablets (100-200 mg total) by mouth at bedtime. Takes (2) '100mg'$  tabs every night   [DISCONTINUED] testosterone cypionate (DEPOTESTOTERONE CYPIONATE) 100 MG/ML injection Inject 1 mL (100 mg total) into the muscle every 14 (fourteen) days. For IM use only   No facility-administered encounter medications on file as of 12/07/2021.    Surgical History: Past Surgical History:  Procedure Laterality Date   ANTERIOR FUSION CERVICAL SPINE  1995   finger debridement      Medical History: Past Medical History:  Diagnosis Date   Arthritis    Cyst, kidney, acquired    GERD (gastroesophageal reflux disease)    Hyperlipidemia    Neuromuscular disorder ([place])    Spine degeneration    Substance abuse ([place])     Family History: Family History  Problem Relation Age of Onset   Arthritis Mother    Hypertension Mother    Hyperlipidemia Mother    Anxiety disorder Mother    Depression Father  COPD Father    Alcohol abuse Maternal Uncle    Alcohol abuse Paternal Uncle    Alcohol abuse Paternal Grandfather    Alcohol abuse Cousin     Social History   Socioeconomic History   Marital status: Divorced    Spouse name: Not on file   Number of children: Not on file   Years of education: Not on file   Highest education level: Not on file  Occupational History   Not on file  Tobacco Use   Smoking status: Former    Packs/day: 1.00    Years: 36.00    Total pack years: 36.00    Types: Cigarettes    Start date: 64    Quit date: 05/29/2021     Years since quitting: 0.5   Smokeless tobacco: Never  Substance and Sexual Activity   Alcohol use: No   Drug use: No    Comment: former substance abuser - cocaine, pills, marijuana   Sexual activity: Not on file  Other Topics Concern   Not on file  Social History Narrative   Not on file   Social Determinants of Health   Financial Resource Strain: Not on file  Food Insecurity: Not on file  Transportation Needs: Not on file  Physical Activity: Not on file  Stress: Not on file  Social Connections: Not on file  Intimate Partner Violence: Not on file      Review of Systems  Constitutional:  Negative for chills, fatigue and unexpected weight change.  HENT:  Negative for congestion, rhinorrhea, sneezing and sore throat.   Eyes:  Negative for redness.  Respiratory: Negative.  Negative for cough, chest tightness, shortness of breath and wheezing.   Cardiovascular: Negative.  Negative for chest pain and palpitations.  Gastrointestinal:  Negative for abdominal pain, constipation, diarrhea, nausea and vomiting.  Genitourinary:  Negative for dysuria and frequency.  Musculoskeletal:  Positive for arthralgias and back pain. Negative for joint swelling and neck pain.  Neurological: Negative.  Negative for tremors and numbness.  Hematological:  Negative for adenopathy. Does not bruise/bleed easily.  Psychiatric/Behavioral:  Negative for behavioral problems (Depression), sleep disturbance and suicidal ideas. The patient is not nervous/anxious.     Vital Signs: BP 107/64   Pulse 88   Temp 98.4 F (36.9 C)   Resp 16   Ht 6' (1.829 m)   Wt 222 lb 3.2 oz (100.8 kg)   SpO2 96%   BMI 30.14 kg/m    Physical Exam Vitals reviewed.  Constitutional:      General: He is not in acute distress.    Appearance: Normal appearance. He is obese. He is not ill-appearing.  HENT:     Head: Normocephalic and atraumatic.  Eyes:     Pupils: Pupils are equal, round, and reactive to light.   Cardiovascular:     Rate and Rhythm: Normal rate and regular rhythm.  Pulmonary:     Effort: Pulmonary effort is normal. No respiratory distress.  Neurological:     Mental Status: He is alert and oriented to person, place, and time.  Psychiatric:        Mood and Affect: Mood normal.        Behavior: Behavior normal.        Assessment/Plan: 1. Mixed hyperlipidemia Continue atorvastatin as prescribed  2. Gastroesophageal reflux disease without esophagitis Lansoprazole is working well, less side effects, continue as prescribed.   3. Chronic bilateral low back pain with bilateral sciatica Continue current medication regimen as  prescribed, not quite ready for another decrease in dose or frequency   General Counseling: Destiny verbalizes understanding of the findings of todays visit and agrees with plan of treatment. I have discussed any further diagnostic evaluation that may be needed or ordered today. We also reviewed his medications today. he has been encouraged to call the office with any questions or concerns that should arise related to todays visit.    No orders of the defined types were placed in this encounter.   No orders of the defined types were placed in this encounter.   Return in about 3 months (around 03/09/2022) for F/U, pain med refill, anxiety med refill, Olivianna Higley PCP.   Total time spent:30 Minutes Time spent includes review of chart, medications, test results, and follow up plan with the patient.   [place] Controlled Substance Database was reviewed by me.  This patient was seen by Jonetta Osgood, FNP-C in collaboration with Dr. Clayborn Bigness as a part of collaborative care agreement.   Oral Hallgren R. Valetta Fuller, MSN, FNP-C Internal medicine

## 2021-12-21 ENCOUNTER — Other Ambulatory Visit: Payer: Self-pay | Admitting: Nurse Practitioner

## 2021-12-21 ENCOUNTER — Telehealth: Payer: Self-pay | Admitting: Nurse Practitioner

## 2021-12-21 DIAGNOSIS — G8929 Other chronic pain: Secondary | ICD-10-CM

## 2021-12-21 NOTE — Telephone Encounter (Signed)
Lmom to call back that we already send med

## 2021-12-21 NOTE — Telephone Encounter (Signed)
Error

## 2021-12-27 ENCOUNTER — Ambulatory Visit: Payer: PPO | Admitting: Dermatology

## 2021-12-27 DIAGNOSIS — L814 Other melanin hyperpigmentation: Secondary | ICD-10-CM | POA: Diagnosis not present

## 2021-12-27 DIAGNOSIS — L578 Other skin changes due to chronic exposure to nonionizing radiation: Secondary | ICD-10-CM | POA: Diagnosis not present

## 2021-12-27 DIAGNOSIS — Z1283 Encounter for screening for malignant neoplasm of skin: Secondary | ICD-10-CM

## 2021-12-27 DIAGNOSIS — L821 Other seborrheic keratosis: Secondary | ICD-10-CM

## 2021-12-27 DIAGNOSIS — L219 Seborrheic dermatitis, unspecified: Secondary | ICD-10-CM | POA: Diagnosis not present

## 2021-12-27 DIAGNOSIS — D229 Melanocytic nevi, unspecified: Secondary | ICD-10-CM

## 2021-12-27 DIAGNOSIS — L820 Inflamed seborrheic keratosis: Secondary | ICD-10-CM | POA: Diagnosis not present

## 2021-12-27 DIAGNOSIS — Z79899 Other long term (current) drug therapy: Secondary | ICD-10-CM | POA: Diagnosis not present

## 2021-12-27 MED ORDER — KETOCONAZOLE 2 % EX SHAM: MEDICATED_SHAMPOO | CUTANEOUS | 11 refills | Status: DC

## 2021-12-27 NOTE — Patient Instructions (Addendum)
Seborrheic Keratosis  What causes seborrheic keratoses? Seborrheic keratoses are harmless, common skin growths that first appear during adult life.  As time goes by, more growths appear.  Some people may develop a large number of them.  Seborrheic keratoses appear on both covered and uncovered body parts.  They are not caused by sunlight.  The tendency to develop seborrheic keratoses can be inherited.  They vary in color from skin-colored to gray, brown, or even black.  They can be either smooth or have a rough, warty surface.   Seborrheic keratoses are superficial and look as if they were stuck on the skin.  Under the microscope this type of keratosis looks like layers upon layers of skin.  That is why at times the top layer may seem to fall off, but the rest of the growth remains and re-grows.    Treatment Seborrheic keratoses do not need to be treated, but can easily be removed in the office.  Seborrheic keratoses often cause symptoms when they rub on clothing or jewelry.  Lesions can be in the way of shaving.  If they become inflamed, they can cause itching, soreness, or burning.  Removal of a seborrheic keratosis can be accomplished by freezing, burning, or surgery. If any spot bleeds, scabs, or grows rapidly, please return to have it checked, as these can be an indication of a skin cancer.  Cryotherapy Aftercare  Wash gently with soap and water everyday.   Apply Vaseline and Band-Aid daily until healed.     For scalp  Start ketoconazole shampoo lather on to scalp massage let sit for at least 5 minutes before rinsing out.  Can also start head & shoulders shampoo on alternate days lather let sit a few minutes then rinse.      Seborrheic Dermatitis  What is seborrheic dermatitis? Seborrheic (say: seb-oh-ree-ick) dermatitis is a disease that causes flaking of the skin.  It usually affects the scalp.  In teenagers and adults, it is commonly called "dandruff".  In infants, it is referred  to as "cradle cap".  Dandruff often appears as scaling on the scalp with or without redness.  On other parts of the body, seborrheic dermatitis tends to produce both redness and scaling.  Other common locations of seborrheic dermatitis include the central face, eyebrows, chest, and the creases of the arms, legs, and groin.  It often causes the skin to look a little greasy, scaly, or flaky. Seborrheic dermatitis can occur at any age.  It often comes and goes and may to be seasonally related, especially in the Northern climates.  What causes seborrheic dermatitis? The exact cause is not known, though yeast of the Malassezia species may be involved.  This organism is normally present on the skin in small numbers, but sometimes its numbers increase, especially in oily skin.  Treatments that reduce the yeast tend to improve seborrheic dermatitis.  How is seborrheic dermatitis treated? The treatment of seborrheic dermatitis depends on its location on the body and the person's age. Seborrheic dermatitis of the scalp (dandruff) in adults and teenagers is usually treated with a medicated shampoo.  Here is a list of the medications that help, and the over-counter shampoos that contain them: Salicylic acid (Neutrogena T/Sal, Sebulex, Scalpicin, Denorex Extra Strength) Zinc pyrithione (Head & Shoulders white bottle, Denorex Daily, DHS Zinc, Pantene Pro-V Pyrithione Zinc) Selenium sulfide (Head & Shoulders blue bottle, Selsun Blue, Exsel Lotion Shampoo, Glo-Sel) Yahoo tar (Neutrogena T/Gal, Pentrax, Zetar, Tegrin, Viacom, Therapeutic Denorex) Ketoconazole (Nizoral)  If you have dandruff, you might start by using one of these shampoos every day until your dandruff is controlled and then keep using it at least twice a week.  Often times your doctor will recommend a rotation of several different medicated shampoos as some will experience a plateau in the effectiveness of any one shampoo.   When you use a dandruff  shampoo, rub the shampoo into your wet hair and massage into scalp thoroughly.  Let it stay on your hair and scalp for 5 minutes before rinsing.  If you have involvement in the eyebrows or face, you can lather those areas with the medicated shampoo as well, or use a medicated soap (ZNP-bar, Polytar Soap, SAStid, or sulfur soap).    If the wash or shampoo alone does not help, your doctor might want you to use a prescription medication once or twice a day.  Leave-in medications for the scalp are best applied by massaging into the scalp immediately after towel drying your hair, but may be applied even if you have not washed your hair.     Seborrheic dermatitis in infants usually clears up by age 64 -42 months.  It may develop in the diaper area where it might be confused with diaper rash.  For milder cases you can try gently brushing out scales with a soft brush.  This is best done immediately after washing with a non-medicated baby shampoo Wynetta Emery and Royce Macadamia, etc.).  Your doctor may recommend a medicated shampoo or a prescription topical medication.          Due to recent changes in healthcare laws, you may see results of your pathology and/or laboratory studies on MyChart before the doctors have had a chance to review them. We understand that in some cases there may be results that are confusing or concerning to you. Please understand that not all results are received at the same time and often the doctors may need to interpret multiple results in order to provide you with the best plan of care or course of treatment. Therefore, we ask that you please give [place] 2 business days to thoroughly review all your results before contacting the office for clarification. Should we see a critical lab result, you will be contacted sooner.   If You Need Anything After Your Visit  If you have any questions or concerns for your doctor, please call our main line at [phone] and press option 4 to reach  your doctor's medical assistant. If no one answers, please leave a voicemail as directed and we will return your call as soon as possible. Messages left after 4 pm will be answered the following business day.   You may also send [place] a message via [place]. We typically respond to MyChart messages within 1-2 business days.  For prescription refills, please ask your pharmacy to contact our office. Our fax number is [phone].  If you have an urgent issue when the clinic is closed that cannot wait until the next business day, you can page your doctor at the number below.    Please note that while we do our best to be available for urgent issues outside of office hours, we are not available 24/7.   If you have an urgent issue and are unable to reach [place], you may choose to seek medical care at your doctor's office, retail clinic, urgent care center, or emergency room.  If you have a medical emergency, please immediately call 911 or go to the  emergency department.  Pager Numbers  - Dr. Nehemiah Massed: [phone]  - Dr. Laurence Ferrari: [phone]  - Dr. Nicole Kindred: [phone]  In the event of inclement weather, please call our main line at [phone] for an update on the status of any delays or closures.  Dermatology Medication Tips: Please keep the boxes that topical medications come in in order to help keep track of the instructions about where and how to use these. Pharmacies typically print the medication instructions only on the boxes and not directly on the medication tubes.   If your medication is too expensive, please contact our office at [phone] option 4 or send [place] a message through [place].   We are unable to tell what your co-pay for medications will be in advance as this is different depending on your insurance coverage. However, we may be able to find a substitute medication at lower cost or fill out paperwork to get insurance to cover a needed medication.   If a prior authorization  is required to get your medication covered by your insurance company, please allow [place] 1-2 business days to complete this process.  Drug prices often vary depending on where the prescription is filled and some pharmacies may offer cheaper prices.  The website www.goodrx.com contains coupons for medications through different pharmacies. The prices here do not account for what the cost may be with help from insurance (it may be cheaper with your insurance), but the website can give you the price if you did not use any insurance.  - You can print the associated coupon and take it with your prescription to the pharmacy.  - You may also stop by our office during regular business hours and pick up a GoodRx coupon card.  - If you need your prescription sent electronically to a different pharmacy, notify our office through [facility] or by phone at [phone] option 4.     Si Usted Necesita Algo Despus de Su Visita  Tambin puede enviarnos un mensaje a travs de Pharmacist, community. Por lo general respondemos a los mensajes de MyChart en el transcurso de 1 a 2 das hbiles.  Para renovar recetas, por favor pida a su farmacia que se ponga en contacto con nuestra oficina. Harland Dingwall de fax es [place] [phone].  Si tiene un asunto urgente cuando la clnica est cerrada y que no puede esperar hasta el siguiente da hbil, puede llamar/localizar a su doctor(a) al nmero que aparece a continuacin.   Por favor, tenga en cuenta que aunque hacemos todo lo posible para estar disponibles para asuntos urgentes fuera del horario de [place], no estamos disponibles las 24 horas del da, los 7 das de la [place].   Si tiene un problema urgente y no puede comunicarse con nosotros, puede optar por buscar atencin mdica  en el consultorio de su doctor(a), en una clnica privada, en un centro de atencin urgente o en una sala de emergencias.  Si tiene Engineering geologist, por favor llame inmediatamente al 911 o  vaya a la sala de emergencias.  Nmeros de bper  - Dr. Nehemiah Massed: [phone]  - Dra. Moye: [phone]  - Dra. Nicole Kindred: [phone]  En caso de inclemencias del [place], por favor llame a Johnsie Kindred principal al [phone] para una actualizacin sobre el [place] de cualquier retraso o cierre.  Consejos para la medicacin en dermatologa: Por favor, guarde las cajas en las que vienen los medicamentos de uso tpico para ayudarle a seguir las instrucciones sobre dnde y cmo usarlos. Las  farmacias generalmente imprimen las instrucciones del medicamento slo en las cajas y no directamente en los tubos del [place].   Si su medicamento es muy caro, por favor, pngase en contacto con Zigmund Daniel llamando al [phone] y presione la opcin 4 o envenos un mensaje a travs de Pharmacist, community.   No podemos decirle cul ser su copago por los medicamentos por adelantado ya que esto es diferente dependiendo de la cobertura de su seguro. Sin embargo, es posible que podamos encontrar un medicamento sustituto a Electrical engineer un formulario para que el seguro cubra el medicamento que se considera necesario.   Si se requiere una autorizacin previa para que su compaa de seguros [place] su medicamento, por favor permtanos de 1 a 2 das hbiles para completar este proceso.  Los precios de los medicamentos varan con frecuencia dependiendo del Environmental consultant de dnde se surte la receta y alguna farmacias pueden ofrecer precios ms baratos.  El sitio web www.goodrx.com tiene cupones para medicamentos de Airline pilot. Los precios aqu no tienen en cuenta lo que podra costar con la ayuda del seguro (puede ser ms barato con su seguro), pero el sitio web puede darle el precio si no utiliz Research scientist (physical sciences).  - Puede imprimir el cupn correspondiente y llevarlo con su receta a la farmacia.  - Tambin puede pasar por nuestra oficina durante el horario de atencin regular y Charity fundraiser una tarjeta de cupones  de GoodRx.  - Si necesita que su receta se enve electrnicamente a una farmacia diferente, informe a nuestra oficina a travs de MyChart de [place] o por telfono llamando al [phone] y presione la opcin 4.

## 2021-12-27 NOTE — Progress Notes (Signed)
New Patient Visit  Subjective  Nathan Meyer is a 61 y.o. male who presents for the following: New Patient (Initial Visit) (Patient reports some moles at back, neck, face. Patient reports itchy scalp. Patient denies personal or family history of skin cancer. ). The patient presents for Upper Body Skin Exam (UBSE) for skin cancer screening and mole check.  The patient has spots, moles and lesions to be evaluated, some may be new or changing and the patient has concerns that these could be cancer.  The following portions of the chart were reviewed this encounter and updated as appropriate:   Tobacco  Allergies  Meds  Problems  Med Hx  Surg Hx  Fam Hx     Review of Systems:  No other skin or systemic complaints except as noted in HPI or Assessment and Plan.  Objective  Well appearing patient in no apparent distress; mood and affect are within normal limits.  All skin waist up examined.  right neck supraclavicular x 1, right top of shoulder x 1, right lower back x 1 (3) Erythematous stuck-on, waxy papule or plaque  Scalp Pink patches with greasy scale.    Assessment & Plan  Inflamed seborrheic keratosis (3) right neck supraclavicular x 1, right top of shoulder x 1, right lower back x 1 Symptomatic, irritating, patient would like treated. Recheck right neck at next follow up Destruction of lesion - right neck supraclavicular x 1, right top of shoulder x 1, right lower back x 1 Complexity: simple   Destruction method: cryotherapy   Informed consent: discussed and consent obtained   Timeout:  patient name, date of birth, surgical site, and procedure verified Lesion destroyed using liquid nitrogen: Yes   Region frozen until ice ball extended beyond lesion: Yes   Outcome: patient tolerated procedure well with no complications   Post-procedure details: wound care instructions given    Seborrheic dermatitis Scalp Seborrheic Dermatitis  -  is a chronic persistent rash  characterized by pinkness and scaling most commonly of the mid face but also can occur on the scalp (dandruff), ears; mid chest, mid back and groin.  It tends to be exacerbated by stress and cooler weather.  People who have neurologic disease may experience new onset or exacerbation of existing seborrheic dermatitis.  The condition is not curable but treatable and can be controlled.  Start ketoconazole shampoo apply three times per week, massage into scalp and leave in for 10 minutes before rinsing out Alternate with head and shoulder shampoo  ketoconazole (NIZORAL) 2 % shampoo - Scalp apply three times per week, massage into scalp and leave in for at least 5 minutes before rinsing out  Lentigines - Scattered tan macules - Due to sun exposure - Benign-appearing, observe - Recommend daily broad spectrum sunscreen SPF 30+ to sun-exposed areas, reapply every 2 hours as needed. - Call for any changes  Seborrheic Keratoses - Stuck-on, waxy, tan-brown papules and/or plaques  - Benign-appearing - Discussed benign etiology and prognosis. - Observe - Call for any changes   Melanocytic Nevi - Tan-brown and/or pink-flesh-colored symmetric macules and papules - Benign appearing on exam today - Observation - Call clinic for new or changing moles - Recommend daily use of broad spectrum spf 30+ sunscreen to sun-exposed areas.   Actinic Damage - chronic, secondary to cumulative UV radiation exposure/sun exposure over time - diffuse scaly erythematous macules with underlying dyspigmentation - Recommend daily broad spectrum sunscreen SPF 30+ to sun-exposed areas, reapply every 2 hours  as needed.  - Recommend staying in the shade or wearing long sleeves, sun glasses (UVA+UVB protection) and wide brim hats (4-inch brim around the entire circumference of the hat). - Call for new or changing lesions.  Return in about 4 months (around 04/27/2022) for recheck isk and seb derm .  IRuthell Rummage, CMA,  am acting as scribe for Sarina Ser, MD. Documentation: I have reviewed the above documentation for accuracy and completeness, and I agree with the above.  Sarina Ser, MD

## 2022-01-01 ENCOUNTER — Encounter: Payer: Self-pay | Admitting: Dermatology

## 2022-01-15 ENCOUNTER — Encounter: Payer: Self-pay | Admitting: Nurse Practitioner

## 2022-01-19 ENCOUNTER — Other Ambulatory Visit: Payer: Self-pay | Admitting: Nurse Practitioner

## 2022-01-19 DIAGNOSIS — Z760 Encounter for issue of repeat prescription: Secondary | ICD-10-CM

## 2022-01-19 DIAGNOSIS — G8929 Other chronic pain: Secondary | ICD-10-CM

## 2022-01-19 NOTE — Telephone Encounter (Signed)
Please review next appt 1/24

## 2022-01-20 ENCOUNTER — Other Ambulatory Visit: Payer: Self-pay | Admitting: Nurse Practitioner

## 2022-01-20 NOTE — Telephone Encounter (Signed)
1 extra month to make it to January appointment, had previously discussed with patient due to holidays

## 2022-03-07 ENCOUNTER — Telehealth: Payer: Self-pay

## 2022-03-07 ENCOUNTER — Ambulatory Visit (INDEPENDENT_AMBULATORY_CARE_PROVIDER_SITE_OTHER): Payer: PPO | Admitting: Nurse Practitioner

## 2022-03-07 ENCOUNTER — Encounter: Payer: Self-pay | Admitting: Nurse Practitioner

## 2022-03-07 VITALS — BP 110/73 | HR 88 | Temp 97.8°F | Resp 16 | Ht 72.0 in | Wt 225.3 lb

## 2022-03-07 DIAGNOSIS — K5903 Drug induced constipation: Secondary | ICD-10-CM | POA: Diagnosis not present

## 2022-03-07 DIAGNOSIS — F419 Anxiety disorder, unspecified: Secondary | ICD-10-CM

## 2022-03-07 DIAGNOSIS — I10 Essential (primary) hypertension: Secondary | ICD-10-CM | POA: Diagnosis not present

## 2022-03-07 DIAGNOSIS — Z760 Encounter for issue of repeat prescription: Secondary | ICD-10-CM

## 2022-03-07 DIAGNOSIS — K219 Gastro-esophageal reflux disease without esophagitis: Secondary | ICD-10-CM

## 2022-03-07 DIAGNOSIS — F32A Depression, unspecified: Secondary | ICD-10-CM | POA: Diagnosis not present

## 2022-03-07 DIAGNOSIS — E782 Mixed hyperlipidemia: Secondary | ICD-10-CM

## 2022-03-07 DIAGNOSIS — F331 Major depressive disorder, recurrent, moderate: Secondary | ICD-10-CM

## 2022-03-07 DIAGNOSIS — T402X5A Adverse effect of other opioids, initial encounter: Secondary | ICD-10-CM

## 2022-03-07 MED ORDER — OXYCODONE-ACETAMINOPHEN 10-325 MG PO TABS: 1.0000 | ORAL_TABLET | Freq: Three times a day (TID) | ORAL | 0 refills | Status: DC | PRN

## 2022-03-07 MED ORDER — ATORVASTATIN CALCIUM 20 MG PO TABS: 20.0000 mg | ORAL_TABLET | Freq: Every day | ORAL | 3 refills | Status: DC

## 2022-03-07 MED ORDER — LANSOPRAZOLE 15 MG PO CPDR: 15.0000 mg | DELAYED_RELEASE_CAPSULE | Freq: Every day | ORAL | 3 refills | Status: DC

## 2022-03-07 MED ORDER — TRAZODONE HCL 100 MG PO TABS: 100.0000 mg | ORAL_TABLET | Freq: Every day | ORAL | 3 refills | Status: DC

## 2022-03-07 MED ORDER — FLUOXETINE HCL 20 MG PO CAPS: 20.0000 mg | ORAL_CAPSULE | Freq: Every day | ORAL | 3 refills | Status: DC

## 2022-03-07 MED ORDER — FLUOXETINE HCL 40 MG PO CAPS: 40.0000 mg | ORAL_CAPSULE | Freq: Every day | ORAL | 3 refills | Status: DC

## 2022-03-07 MED ORDER — ALPRAZOLAM 0.5 MG PO TABS: 0.5000 mg | ORAL_TABLET | Freq: Three times a day (TID) | ORAL | 2 refills | Status: DC | PRN

## 2022-03-07 MED ORDER — RELISTOR 150 MG PO TABS: 450.0000 mg | ORAL_TABLET | Freq: Every day | ORAL | 1 refills | Status: DC | PRN

## 2022-03-07 MED ORDER — HYDROCHLOROTHIAZIDE 12.5 MG PO CAPS: 12.5000 mg | ORAL_CAPSULE | Freq: Every morning | ORAL | 3 refills | Status: DC

## 2022-03-07 MED ORDER — IRBESARTAN 75 MG PO TABS: 75.0000 mg | ORAL_TABLET | Freq: Every day | ORAL | 3 refills | Status: DC

## 2022-03-07 NOTE — Progress Notes (Signed)
[facility] [place], [place] [address]  Internal MEDICINE  Office Visit Note  Patient Name: Nathan Meyer  [id number]  [id number]  Date of Service: 03/07/2022  Chief Complaint  Patient presents with   Follow-up   Gastroesophageal Reflux   Hyperlipidemia    HPI Nathan Meyer presents for a follow-up visit for chronic pain, depression, anxiety, constipation and medication refills.  Chronic pain -- taking percocet 7.5 mg TID prn but sometimes takes is 4 times a day. Not managing pain well. Wants to try increasing pain med if possible but pharmacy is out of stock of 10 mg percocet dose.  Depression -- feels increasingly depressed, taking care of other family members with dementia. Wondering about increasing his fluoxetine dose.  Anxiety -- anxiety is ok with current dose of alprazolam  Constipation -- increased constipation, due to chronic opioid use.    Current Medication: Outpatient Encounter Medications as of 03/07/2022  Medication Sig   carisoprodol (SOMA) 350 MG tablet Take 350 mg by mouth 2 (two) times daily.   Cholecalciferol (VITAMIN D3 PO) Take by mouth.   Cyanocobalamin (B-12 PO) Take by mouth.   FLUoxetine (PROZAC) 20 MG capsule Take 1 capsule (20 mg total) by mouth daily.   ketoconazole (NIZORAL) 2 % shampoo apply three times per week, massage into scalp and leave in for at least 5 minutes before rinsing out   Methylnaltrexone Bromide (RELISTOR) 150 MG TABS Take 3 tablets (450 mg total) by mouth daily as needed (constipation).   Multiple Vitamin (MULTIVITAMIN ADULT PO) Take by mouth.   tiZANidine (ZANAFLEX) 2 MG tablet TAKE 1 TABLET BY MOUTH EVERY 6 HOURS AS NEEDED FOR MUSCLE SPASM **NO MORE THAN 4 TABS IN 24 HOURS**   [DISCONTINUED] ALPRAZolam (XANAX) 0.5 MG tablet Take 1 tablet (0.5 mg total) by mouth 3 (three) times daily as needed for anxiety.   [DISCONTINUED] atorvastatin (LIPITOR) 20 MG tablet Take 1 tablet (20 mg total) by mouth at bedtime.    [DISCONTINUED] FLUoxetine (PROZAC) 40 MG capsule Take 1 capsule (40 mg total) by mouth daily.   [DISCONTINUED] hydrochlorothiazide (MICROZIDE) 12.5 MG capsule Take 1 capsule (12.5 mg total) by mouth every morning.   [DISCONTINUED] irbesartan (AVAPRO) 75 MG tablet Take 1 tablet (75 mg total) by mouth daily.   [DISCONTINUED] lansoprazole (PREVACID) 15 MG capsule Take 1 capsule (15 mg total) by mouth daily.   [DISCONTINUED] oxyCODONE-acetaminophen (PERCOCET) 10-325 MG tablet Take 1 tablet by mouth every 8 (eight) hours as needed for pain.   [DISCONTINUED] oxyCODONE-acetaminophen (PERCOCET) 10-325 MG tablet Take 1 tablet by mouth every 8 (eight) hours as needed for pain.   [DISCONTINUED] oxyCODONE-acetaminophen (PERCOCET) 10-325 MG tablet Take 1 tablet by mouth every 8 (eight) hours as needed for pain.   [DISCONTINUED] oxyCODONE-acetaminophen (PERCOCET) 7.5-325 MG tablet Take 1 tablet by mouth every 8 (eight) hours as needed.   [DISCONTINUED] oxyCODONE-acetaminophen (PERCOCET) 7.5-325 MG tablet Take 1 tablet by mouth every 8 (eight) hours as needed for moderate pain or severe pain.   [DISCONTINUED] oxyCODONE-acetaminophen (PERCOCET) 7.5-325 MG tablet TAKE 1 TABLET BY MOUTH EVERY 8 HOURS AS NEEDED FOR MODERATE OR SEVERE PAIN   [DISCONTINUED] traZODone (DESYREL) 100 MG tablet Take 1-2 tablets (100-200 mg total) by mouth at bedtime. Takes (2) '100mg'$  tabs every night   ALPRAZolam (XANAX) 0.5 MG tablet Take 1 tablet (0.5 mg total) by mouth 3 (three) times daily as needed for anxiety.   atorvastatin (LIPITOR) 20 MG tablet Take 1 tablet (20 mg total) by  mouth at bedtime.   FLUoxetine (PROZAC) 40 MG capsule Take 1 capsule (40 mg total) by mouth daily.   hydrochlorothiazide (MICROZIDE) 12.5 MG capsule Take 1 capsule (12.5 mg total) by mouth every morning.   irbesartan (AVAPRO) 75 MG tablet Take 1 tablet (75 mg total) by mouth daily.   lansoprazole (PREVACID) 15 MG capsule Take 1 capsule (15 mg total) by mouth  daily.   traZODone (DESYREL) 100 MG tablet Take 1-2 tablets (100-200 mg total) by mouth at bedtime. Takes (2) '100mg'$  tabs every night   No facility-administered encounter medications on file as of 03/07/2022.    Surgical History: Past Surgical History:  Procedure Laterality Date   ANTERIOR FUSION CERVICAL SPINE  1995   finger debridement      Medical History: Past Medical History:  Diagnosis Date   Arthritis    Cyst, kidney, acquired    GERD (gastroesophageal reflux disease)    Hyperlipidemia    Neuromuscular disorder ([place])    Spine degeneration    Substance abuse ([place])     Family History: Family History  Problem Relation Age of Onset   Arthritis Mother    Hypertension Mother    Hyperlipidemia Mother    Anxiety disorder Mother    Depression Father    COPD Father    Alcohol abuse Maternal Uncle    Alcohol abuse Paternal Uncle    Alcohol abuse Paternal Grandfather    Alcohol abuse Cousin     Social History   Socioeconomic History   Marital status: Divorced    Spouse name: Not on file   Number of children: Not on file   Years of education: Not on file   Highest education level: Not on file  Occupational History   Not on file  Tobacco Use   Smoking status: Former    Packs/day: 1.00    Years: 36.00    Total pack years: 36.00    Types: Cigarettes    Start date: 6    Quit date: 05/29/2021    Years since quitting: 0.7   Smokeless tobacco: Never  Substance and Sexual Activity   Alcohol use: No   Drug use: No    Comment: former substance abuser - cocaine, pills, marijuana   Sexual activity: Not on file  Other Topics Concern   Not on file  Social History Narrative   Not on file   Social Determinants of Health   Financial Resource Strain: Not on file  Food Insecurity: Not on file  Transportation Needs: Not on file  Physical Activity: Not on file  Stress: Not on file  Social Connections: Not on file  Intimate Partner Violence: Not on file       Review of Systems  Constitutional:  Negative for chills, fatigue and unexpected weight change.  HENT:  Negative for congestion, rhinorrhea, sneezing and sore throat.   Eyes:  Negative for redness.  Respiratory: Negative.  Negative for cough, chest tightness, shortness of breath and wheezing.   Cardiovascular: Negative.  Negative for chest pain and palpitations.  Gastrointestinal:  Negative for abdominal pain, constipation, diarrhea, nausea and vomiting.  Genitourinary:  Negative for dysuria and frequency.  Musculoskeletal:  Positive for arthralgias and back pain. Negative for joint swelling and neck pain.  Neurological: Negative.  Negative for tremors and numbness.  Hematological:  Negative for adenopathy. Does not bruise/bleed easily.  Psychiatric/Behavioral:  Negative for behavioral problems (Depression), sleep disturbance and suicidal ideas. The patient is not nervous/anxious.     Vital Signs:  BP 110/73   Pulse 88   Temp 97.8 F (36.6 C)   Resp 16   Ht 6' (1.829 m)   Wt 225 lb 4.8 oz (102.2 kg)   SpO2 94%   BMI 30.56 kg/m    Physical Exam Vitals reviewed.  Constitutional:      General: He is not in acute distress.    Appearance: Normal appearance. He is obese. He is not ill-appearing.  HENT:     Head: Normocephalic and atraumatic.  Eyes:     Pupils: Pupils are equal, round, and reactive to light.  Cardiovascular:     Rate and Rhythm: Normal rate and regular rhythm.  Pulmonary:     Effort: Pulmonary effort is normal. No respiratory distress.  Neurological:     Mental Status: He is alert and oriented to person, place, and time.  Psychiatric:        Mood and Affect: Mood normal.        Behavior: Behavior normal.        Assessment/Plan: 1. Essential hypertension BP stable, continue irbesartan and HCTZ as prescribed.  - hydrochlorothiazide (MICROZIDE) 12.5 MG capsule; Take 1 capsule (12.5 mg total) by mouth every morning.  Dispense: 90 capsule; Refill:  3 - irbesartan (AVAPRO) 75 MG tablet; Take 1 tablet (75 mg total) by mouth daily.  Dispense: 90 tablet; Refill: 3  2. Gastroesophageal reflux disease without esophagitis Continue lansoprazole as prescribed.  - lansoprazole (PREVACID) 15 MG capsule; Take 1 capsule (15 mg total) by mouth daily.  Dispense: 90 capsule; Refill: 3  3. Mixed hyperlipidemia Continue atorvastatin as prescribed - atorvastatin (LIPITOR) 20 MG tablet; Take 1 tablet (20 mg total) by mouth at bedtime.  Dispense: 90 tablet; Refill: 3  4. Therapeutic opioid-induced constipation (OIC) Can try relistor for constipation. Stop all other medications, prescription and OTC for constipation - Methylnaltrexone Bromide (RELISTOR) 150 MG TABS; Take 3 tablets (450 mg total) by mouth daily as needed (constipation).  Dispense: 90 tablet; Refill: 1  5. Anxiety and depression Fluoxetine dose increased to 60 mg daily. Having difficulty coping with personal stressors. Continue trazodone for sleep - FLUoxetine (PROZAC) 20 MG capsule; Take 1 capsule (20 mg total) by mouth daily.  Dispense: 90 capsule; Refill: 3 - ALPRAZolam (XANAX) 0.5 MG tablet; Take 1 tablet (0.5 mg total) by mouth 3 (three) times daily as needed for anxiety.  Dispense: 90 tablet; Refill: 2 - FLUoxetine (PROZAC) 40 MG capsule; Take 1 capsule (40 mg total) by mouth daily.  Dispense: 90 capsule; Refill: 3 - traZODone (DESYREL) 100 MG tablet; Take 1-2 tablets (100-200 mg total) by mouth at bedtime. Takes (2) '100mg'$  tabs every night  Dispense: 180 tablet; Refill: 3   General Counseling: Nathan Meyer verbalizes understanding of the findings of todays visit and agrees with plan of treatment. I have discussed any further diagnostic evaluation that may be needed or ordered today. We also reviewed his medications today. he has been encouraged to call the office with any questions or concerns that should arise related to todays visit.    No orders of the defined types were placed in this  encounter.   Meds ordered this encounter  Medications   FLUoxetine (PROZAC) 20 MG capsule    Sig: Take 1 capsule (20 mg total) by mouth daily.    Dispense:  90 capsule    Refill:  3    Take with 40 mg capsule for total dose of 60 mg daily.   DISCONTD: oxyCODONE-acetaminophen (PERCOCET) 10-325 MG tablet  Sig: Take 1 tablet by mouth every 8 (eight) hours as needed for pain.    Dispense:  90 tablet    Refill:  0    Note increased dose, please fill new script. Fill for 03/07/22   DISCONTD: oxyCODONE-acetaminophen (PERCOCET) 10-325 MG tablet    Sig: Take 1 tablet by mouth every 8 (eight) hours as needed for pain.    Dispense:  90 tablet    Refill:  0    Fill for 04/04/22   DISCONTD: oxyCODONE-acetaminophen (PERCOCET) 10-325 MG tablet    Sig: Take 1 tablet by mouth every 8 (eight) hours as needed for pain.    Dispense:  90 tablet    Refill:  0    Fill for 05/02/22   Methylnaltrexone Bromide (RELISTOR) 150 MG TABS    Sig: Take 3 tablets (450 mg total) by mouth daily as needed (constipation).    Dispense:  90 tablet    Refill:  1    New med, fill today   ALPRAZolam (XANAX) 0.5 MG tablet    Sig: Take 1 tablet (0.5 mg total) by mouth 3 (three) times daily as needed for anxiety.    Dispense:  90 tablet    Refill:  2   atorvastatin (LIPITOR) 20 MG tablet    Sig: Take 1 tablet (20 mg total) by mouth at bedtime.    Dispense:  90 tablet    Refill:  3   FLUoxetine (PROZAC) 40 MG capsule    Sig: Take 1 capsule (40 mg total) by mouth daily.    Dispense:  90 capsule    Refill:  3   hydrochlorothiazide (MICROZIDE) 12.5 MG capsule    Sig: Take 1 capsule (12.5 mg total) by mouth every morning.    Dispense:  90 capsule    Refill:  3   irbesartan (AVAPRO) 75 MG tablet    Sig: Take 1 tablet (75 mg total) by mouth daily.    Dispense:  90 tablet    Refill:  3   lansoprazole (PREVACID) 15 MG capsule    Sig: Take 1 capsule (15 mg total) by mouth daily.    Dispense:  90 capsule    Refill:  3    traZODone (DESYREL) 100 MG tablet    Sig: Take 1-2 tablets (100-200 mg total) by mouth at bedtime. Takes (2) '100mg'$  tabs every night    Dispense:  180 tablet    Refill:  3    Return in about 3 months (around 05/31/2022) for F/U, pain med refill, anxiety med refill, Ariea Rochin PCP.   Total time spent:30 Minutes Time spent includes review of chart, medications, test results, and follow up plan with the patient.   [place] Controlled Substance Database was reviewed by me.  This patient was seen by Jonetta Osgood, FNP-C in collaboration with Dr. Clayborn Bigness as a part of collaborative care agreement.   Afsheen Antony R. Valetta Fuller, MSN, FNP-C Internal medicine

## 2022-03-09 ENCOUNTER — Telehealth: Payer: Self-pay

## 2022-03-10 MED ORDER — OXYCODONE-ACETAMINOPHEN 7.5-325 MG PO TABS: 1.0000 | ORAL_TABLET | ORAL | 0 refills | Status: DC | PRN

## 2022-03-10 NOTE — Telephone Encounter (Signed)
Lmom that we send med with change

## 2022-03-10 NOTE — Telephone Encounter (Signed)
10 mg percocet is out of stock, 7.5 mg resent to pharmacy

## 2022-03-12 ENCOUNTER — Encounter: Payer: Self-pay | Admitting: Nurse Practitioner

## 2022-03-15 ENCOUNTER — Telehealth: Payer: Self-pay

## 2022-03-15 ENCOUNTER — Other Ambulatory Visit: Payer: Self-pay | Admitting: Nurse Practitioner

## 2022-03-15 MED ORDER — OXYCODONE-ACETAMINOPHEN 7.5-325 MG PO TABS: 1.0000 | ORAL_TABLET | Freq: Three times a day (TID) | ORAL | 0 refills | Status: DC | PRN

## 2022-03-15 NOTE — Telephone Encounter (Signed)
Lmom that we fix percocet  refills

## 2022-03-15 NOTE — Telephone Encounter (Signed)
Done

## 2022-04-26 ENCOUNTER — Other Ambulatory Visit: Payer: Self-pay | Admitting: Dermatology

## 2022-04-26 DIAGNOSIS — L219 Seborrheic dermatitis, unspecified: Secondary | ICD-10-CM

## 2022-04-27 ENCOUNTER — Ambulatory Visit: Payer: PPO | Admitting: Dermatology

## 2022-04-27 VITALS — BP 121/76

## 2022-04-27 DIAGNOSIS — L219 Seborrheic dermatitis, unspecified: Secondary | ICD-10-CM | POA: Diagnosis not present

## 2022-04-27 DIAGNOSIS — L03818 Cellulitis of other sites: Secondary | ICD-10-CM

## 2022-04-27 DIAGNOSIS — Z79899 Other long term (current) drug therapy: Secondary | ICD-10-CM

## 2022-04-27 DIAGNOSIS — L720 Epidermal cyst: Secondary | ICD-10-CM

## 2022-04-27 DIAGNOSIS — H6012 Cellulitis of left external ear: Secondary | ICD-10-CM | POA: Diagnosis not present

## 2022-04-27 MED ORDER — DOXYCYCLINE HYCLATE 100 MG PO TABS: 100.0000 mg | ORAL_TABLET | Freq: Every day | ORAL | 1 refills | Status: DC

## 2022-04-27 MED ORDER — MOMETASONE FUROATE 0.1 % EX SOLN: CUTANEOUS | 2 refills | Status: DC

## 2022-04-27 NOTE — Progress Notes (Signed)
   Follow-Up Visit   Subjective  Nathan Meyer is a 62 y.o. male who presents for the following: Follow-up (ISK follow up of right neck, right top of shoulder, right low back treated with LN2/Seb derm follow up - Ketocoanzole 2% shampoo - working pretty good) and Other (Cyst of left post ear).  The following portions of the chart were reviewed this encounter and updated as appropriate:   Tobacco  Allergies  Meds  Problems  Med Hx  Surg Hx  Fam Hx     Review of Systems:  No other skin or systemic complaints except as noted in HPI or Assessment and Plan.  Objective  Well appearing patient in no apparent distress; mood and affect are within normal limits.  A focused examination was performed including scalp, face, back. Relevant physical exam findings are noted in the Assessment and Plan.  Scalp Clear today  Left post ear Subcutaneous nodule.    Assessment & Plan  Seborrheic dermatitis Scalp Seborrheic Dermatitis  -  is a chronic persistent rash characterized by pinkness and scaling most commonly of the mid face but also can occur on the scalp (dandruff), ears; mid chest, mid back and groin.  It tends to be exacerbated by stress and cooler weather.  People who have neurologic disease may experience new onset or exacerbation of existing seborrheic dermatitis.  The condition is not curable but treatable and can be controlled.  Improved - continue Ketoconazole 2% shampoo 3 times per week Start Mometasone lotion 3-4 days per week prn itch  mometasone (ELOCON) 0.1 % lotion - Scalp Apply topically as directed. 3-4 times per week prn itch Related Medications ketoconazole (NIZORAL) 2 % shampoo APPLY 3 TIMES PER WEEK. MASSAGE INTO THESCALP AND LEAVE FOR AT LEAST 5 MINUTES BEFORE RINSING OUT.  Epidermal inclusion cyst With rupture and inflammation and cellulitis Left post ear Discussed excision. Patient will schedule surgery appointment.  Start Doxycycline 100 mg 1 po qd  with food and plenty of fluid  doxycycline (VIBRA-TABS) 100 MG tablet - Left post ear Take 1 tablet (100 mg total) by mouth daily. With food and plenty of fluid  Return for Surgery cyst left post ear.  I, Ashok Cordia, CMA, am acting as scribe for Sarina Ser, MD . Documentation: I have reviewed the above documentation for accuracy and completeness, and I agree with the above.  Sarina Ser, MD

## 2022-04-27 NOTE — Patient Instructions (Signed)
PRE-OPERATIVE INSTRUCTIONS  We recommend you read the following instructions. If you have any questions or concerns, please call the office at [phone].  Shower and wash the entire body with soap and water the day of your surgery paying special attention to cleansing at and around the planned surgery site. Avoid aspirin or aspirin containing products at least ten (10) days prior to your surgical procedure and for at least one week (7 days) after your surgical procedure. If you take aspirin on a regular basis for heart disease or history of stroke or for any other reason, we may recommend you continue taking aspirin but please notify us if you take this on a regular basis. Aspirin can cause more bleeding to occur during surgery as well as prolonged bleeding and bruising after surgery. Avoid other nonsteroidal pain medications at least one week prior to surgery and at least one week after your surgery. These include medications such as Ibuprofen (Motrin, Advil and Nuprin), Naprosyn, Voltaren, Relafen, etc. If these medications are used for therapeutic reasons, please inform us as they can cause increased bleeding or prolonged bleeding during and bruising after surgical procedures.  Please advise [place] if you are taking any "blood thinner" medications such as Coumadin or Dipyridamole or Plavix or similar medications. These cause increased bleeding and prolonged bleeding during procedures and bruising after surgical procedures. We may have to consider discontinuing these medications briefly prior to and shortly after your surgery if safe to do so. Please inform us of all medications you are currently taking. All medications that are taken regularly should be taken the day of surgery as you always do. Nevertheless, we need to be informed of what medications you are taking prior to surgery to know whether they will affect the procedure or cause any complications. Please inform us of any medication allergies.  Also inform us of whether you have allergies to Latex or rubber products or whether you have had any adverse reaction to Lidocaine or Epinephrine. Please inform us of any prosthetic or artificial body parts such as artificial heart valve, joint replacements, etc., or similar condition that might require preoperative antibiotics. We recommend avoidance of alcohol at least two weeks prior to surgery and continued avoidance for at least two weeks after surgery. We recommend avoidance of tobacco smoking at least two weeks prior to surgery and continued abstinence for at least two weeks after surgery. Do not plan strenuous exercise, strenuous work or strenuous lifting for approximately four weeks after your surgery. We request if you are unable to make your scheduled surgical appointment, please call us at least a week in advance or as soon as you are aware of a problem so that we can cancel or reschedule the appointment. You MAY TAKE TYLENOL (acetaminophen) for pain as it is not a blood thinner. PLEASE PLAN TO BE IN TOWN FOR TWO WEEKS FOLLOWING SURGERY. THIS IS IMPORTANT SO YOU CAN BE CHECKED FOR DRESSING CHANGES, SUTURE REMOVAL AND TO MONITOR FOR POSSIBLE COMPLICATIONS.  Doxycycline should be taken with food to prevent nausea. Do not lay down for 30 minutes after taking. Be cautious with sun exposure and use good sun protection while on this medication. Pregnant women should not take this medication.      Due to recent changes in healthcare laws, you may see results of your pathology and/or laboratory studies on MyChart before the doctors have had a chance to review them. We understand that in some cases there may be results that are confusing  or concerning to you. Please understand that not all results are received at the same time and often the doctors may need to interpret multiple results in order to provide you with the best plan of care or course of treatment. Therefore, we ask that you please give  [place] 2 business days to thoroughly review all your results before contacting the office for clarification. Should we see a critical lab result, you will be contacted sooner.   If You Need Anything After Your Visit  If you have any questions or concerns for your doctor, please call our main line at [phone] and press option 4 to reach your doctor's medical assistant. If no one answers, please leave a voicemail as directed and we will return your call as soon as possible. Messages left after 4 pm will be answered the following business day.   You may also send [place] a message via [place]. We typically respond to MyChart messages within 1-2 business days.  For prescription refills, please ask your pharmacy to contact our office. Our fax number is [phone].  If you have an urgent issue when the clinic is closed that cannot wait until the next business day, you can page your doctor at the number below.    Please note that while we do our best to be available for urgent issues outside of office hours, we are not available 24/7.   If you have an urgent issue and are unable to reach [place], you may choose to seek medical care at your doctor's office, retail clinic, urgent care center, or emergency room.  If you have a medical emergency, please immediately call 911 or go to the emergency department.  Pager Numbers  - Dr. Nehemiah Massed: [phone]  - Dr. Laurence Ferrari: [phone]  - Dr. Nicole Kindred: [phone]  In the event of inclement weather, please call our main line at [phone] for an update on the status of any delays or closures.  Dermatology Medication Tips: Please keep the boxes that topical medications come in in order to help keep track of the instructions about where and how to use these. Pharmacies typically print the medication instructions only on the boxes and not directly on the medication tubes.   If your medication is too expensive, please contact our office at [phone] option 4  or send [place] a message through [place].   We are unable to tell what your co-pay for medications will be in advance as this is different depending on your insurance coverage. However, we may be able to find a substitute medication at lower cost or fill out paperwork to get insurance to cover a needed medication.   If a prior authorization is required to get your medication covered by your insurance company, please allow [place] 1-2 business days to complete this process.  Drug prices often vary depending on where the prescription is filled and some pharmacies may offer cheaper prices.  The website www.goodrx.com contains coupons for medications through different pharmacies. The prices here do not account for what the cost may be with help from insurance (it may be cheaper with your insurance), but the website can give you the price if you did not use any insurance.  - You can print the associated coupon and take it with your prescription to the pharmacy.  - You may also stop by our office during regular business hours and pick up a GoodRx coupon card.  - If you need your prescription sent electronically to a different pharmacy, notify our office  through [facility] or by phone at [phone] option 4.     Si Usted Necesita Algo Despus de Su Visita  Tambin puede enviarnos un mensaje a travs de Pharmacist, community. Por lo general respondemos a los mensajes de MyChart en el transcurso de 1 a 2 das hbiles.  Para renovar recetas, por favor pida a su farmacia que se ponga en contacto con nuestra oficina. Harland Dingwall de fax es [place] [phone].  Si tiene un asunto urgente cuando la clnica est cerrada y que no puede esperar hasta el siguiente da hbil, puede llamar/localizar a su doctor(a) al nmero que aparece a continuacin.   Por favor, tenga en cuenta que aunque hacemos todo lo posible para estar disponibles para asuntos urgentes fuera del horario de [place], no estamos disponibles las 24 horas  del da, los 7 das de la [place].   Si tiene un problema urgente y no puede comunicarse con nosotros, puede optar por buscar atencin mdica  en el consultorio de su doctor(a), en una clnica privada, en un centro de atencin urgente o en una sala de emergencias.  Si tiene Engineering geologist, por favor llame inmediatamente al 911 o vaya a la sala de emergencias.  Nmeros de bper  - Dr. Nehemiah Massed: [phone]  - Dra. Moye: [phone]  - Dra. Nicole Kindred: [phone]  En caso de inclemencias del [place], por favor llame a Johnsie Kindred principal al [phone] para una actualizacin sobre el [place] de cualquier retraso o cierre.  Consejos para la medicacin en dermatologa: Por favor, guarde las cajas en las que vienen los medicamentos de uso tpico para ayudarle a seguir las instrucciones sobre dnde y cmo usarlos. Las farmacias generalmente imprimen las instrucciones del medicamento slo en las cajas y no directamente en los tubos del [place].   Si su medicamento es muy caro, por favor, pngase en contacto con Zigmund Daniel llamando al [phone] y presione la opcin 4 o envenos un mensaje a travs de Pharmacist, community.   No podemos decirle cul ser su copago por los medicamentos por adelantado ya que esto es diferente dependiendo de la cobertura de su seguro. Sin embargo, es posible que podamos encontrar un medicamento sustituto a Electrical engineer un formulario para que el seguro cubra el medicamento que se considera necesario.   Si se requiere una autorizacin previa para que su compaa de seguros [place] su medicamento, por favor permtanos de 1 a 2 das hbiles para completar este proceso.  Los precios de los medicamentos varan con frecuencia dependiendo del Environmental consultant de dnde se surte la receta y alguna farmacias pueden ofrecer precios ms baratos.  El sitio web www.goodrx.com tiene cupones para medicamentos de Airline pilot. Los precios aqu no tienen en cuenta lo que  podra costar con la ayuda del seguro (puede ser ms barato con su seguro), pero el sitio web puede darle el precio si no utiliz Research scientist (physical sciences).  - Puede imprimir el cupn correspondiente y llevarlo con su receta a la farmacia.  - Tambin puede pasar por nuestra oficina durante el horario de atencin regular y Charity fundraiser una tarjeta de cupones de GoodRx.  - Si necesita que su receta se enve electrnicamente a una farmacia diferente, informe a nuestra oficina a travs de MyChart de [place] o por telfono llamando al [phone] y presione la opcin 4.

## 2022-04-29 ENCOUNTER — Encounter: Payer: Self-pay | Admitting: Dermatology

## 2022-05-02 ENCOUNTER — Other Ambulatory Visit: Payer: Self-pay

## 2022-05-02 ENCOUNTER — Encounter: Payer: Self-pay | Admitting: Dermatology

## 2022-05-02 MED ORDER — MINOCYCLINE HCL 100 MG PO CAPS: 100.0000 mg | ORAL_CAPSULE | Freq: Every day | ORAL | 0 refills | Status: DC

## 2022-05-19 ENCOUNTER — Other Ambulatory Visit: Payer: Self-pay | Admitting: Nurse Practitioner

## 2022-05-19 DIAGNOSIS — F32A Depression, unspecified: Secondary | ICD-10-CM

## 2022-05-19 NOTE — Telephone Encounter (Signed)
Next appt 06/14/2022

## 2022-05-31 ENCOUNTER — Telehealth: Payer: Self-pay

## 2022-05-31 ENCOUNTER — Ambulatory Visit: Payer: PPO | Admitting: Dermatology

## 2022-05-31 DIAGNOSIS — L720 Epidermal cyst: Secondary | ICD-10-CM | POA: Diagnosis not present

## 2022-05-31 DIAGNOSIS — D485 Neoplasm of uncertain behavior of skin: Secondary | ICD-10-CM

## 2022-05-31 MED ORDER — MUPIROCIN 2 % EX OINT
1.0000 | TOPICAL_OINTMENT | Freq: Every day | CUTANEOUS | 0 refills | Status: DC
Start: 2022-05-31 — End: 2023-03-20

## 2022-05-31 NOTE — Telephone Encounter (Signed)
Patient doing well after todays surgery./sh 

## 2022-05-31 NOTE — Progress Notes (Unsigned)
   Follow-Up Visit   Subjective  Nathan Meyer is a 62 y.o. male who presents for the following: Excision of Cyst vs other of left post ear The following portions of the chart were reviewed this encounter and updated as appropriate: medications, allergies, medical history  Review of Systems:  No other skin or systemic complaints except as noted in HPI or Assessment and Plan.  Objective  Well appearing patient in no apparent distress; mood and affect are within normal limits. A focused examination was performed of the following areas: Left ear  Relevant physical exam findings are noted in the Assessment and Plan.   Left post ear Cystic papule    Assessment & Plan   Neoplasm of uncertain behavior of skin - suspected changing symptomatic cyst Left post ear  Skin excision  Lesion length (cm):  2.5 Lesion width (cm):  1.5 Margin per side (cm):  0 Total excision diameter (cm):  2.5 Informed consent: discussed and consent obtained   Timeout: patient name, date of birth, surgical site, and procedure verified   Procedure prep:  Patient was prepped and draped in usual sterile fashion Prep type:  Isopropyl alcohol and povidone-iodine Anesthesia: the lesion was anesthetized in a standard fashion   Anesthetic:  1% lidocaine w/ epinephrine 1-100,000 buffered w/ 8.4% NaHCO3 Instrument used: #15 blade   Hemostasis achieved with: pressure   Hemostasis achieved with comment:  Electrocautery Outcome: patient tolerated procedure well with no complications   Post-procedure details: sterile dressing applied and wound care instructions given   Dressing type: bandage and pressure dressing (mupirocin)    Skin repair Complexity:  Complex Final length (cm):  2.8 Reason for type of repair: reduce tension to allow closure, reduce the risk of dehiscence, infection, and necrosis, reduce subcutaneous dead space and avoid a hematoma, allow closure of the large defect, preserve normal anatomy,  preserve normal anatomical and functional relationships and enhance both functionality and cosmetic results   Undermining: area extensively undermined   Undermining comment:  Undermining defect 2.5 cm Subcutaneous layers (deep stitches):  Suture size:  5-0 Suture type: Vicryl (polyglactin 910)   Subcutaneous suture technique: inverted dermal. Fine/surface layer approximation (top stitches):  Suture size:  5-0 Suture type: nylon   Stitches: simple running   Suture removal (days):  7 Hemostasis achieved with: suture and pressure Outcome: patient tolerated procedure well with no complications   Post-procedure details: sterile dressing applied and wound care instructions given   Dressing type: bandage and pressure dressing (mupirocin)    mupirocin ointment (BACTROBAN) 2 % Apply 1 Application topically daily. With dressing changes  Specimen 1 - Surgical pathology Differential Diagnosis: Cyst vs other  Check Margins: No  Mupirocin oint qd with dressing change   Return in about 1 week (around 06/07/2022) for suture removal.  I, Joanie Coddington, CMA, am acting as scribe for Armida Sans, MD .  Documentation: I have reviewed the above documentation for accuracy and completeness, and I agree with the above.  Armida Sans, MD

## 2022-05-31 NOTE — Patient Instructions (Signed)

## 2022-06-01 ENCOUNTER — Encounter: Payer: Self-pay | Admitting: Dermatology

## 2022-06-07 ENCOUNTER — Ambulatory Visit (INDEPENDENT_AMBULATORY_CARE_PROVIDER_SITE_OTHER): Payer: PPO | Admitting: Dermatology

## 2022-06-07 VITALS — BP 127/83 | HR 78

## 2022-06-07 DIAGNOSIS — Z4802 Encounter for removal of sutures: Secondary | ICD-10-CM

## 2022-06-07 DIAGNOSIS — L729 Follicular cyst of the skin and subcutaneous tissue, unspecified: Secondary | ICD-10-CM

## 2022-06-07 DIAGNOSIS — L720 Epidermal cyst: Secondary | ICD-10-CM

## 2022-06-07 NOTE — Patient Instructions (Signed)
Due to recent changes in healthcare laws, you may see results of your pathology and/or laboratory studies on MyChart before the doctors have had a chance to review them. We understand that in some cases there may be results that are confusing or concerning to you. Please understand that not all results are received at the same time and often the doctors may need to interpret multiple results in order to provide you with the best plan of care or course of treatment. Therefore, we ask that you please give us 2 business days to thoroughly review all your results before contacting the office for clarification. Should we see a critical lab result, you will be contacted sooner.   If You Need Anything After Your Visit  If you have any questions or concerns for your doctor, please call our main line at [phone] and press option 4 to reach your doctor's medical assistant. If no one answers, please leave a voicemail as directed and we will return your call as soon as possible. Messages left after 4 pm will be answered the following business day.   You may also send us a message via MyChart. We typically respond to MyChart messages within 1-2 business days.  For prescription refills, please ask your pharmacy to contact our office. Our fax number is [phone].  If you have an urgent issue when the clinic is closed that cannot wait until the next business day, you can page your doctor at the number below.    Please note that while we do our best to be available for urgent issues outside of office hours, we are not available 24/7.   If you have an urgent issue and are unable to reach us, you may choose to seek medical care at your doctor's office, retail clinic, urgent care center, or emergency room.  If you have a medical emergency, please immediately call 911 or go to the emergency department.  Pager Numbers  - Dr. Kowalski: [phone]  - Dr. Moye: [phone]  - Dr. Stewart:  [phone]  In the event of inclement weather, please call our main line at [phone] for an update on the status of any delays or closures.  Dermatology Medication Tips: Please keep the boxes that topical medications come in in order to help keep track of the instructions about where and how to use these. Pharmacies typically print the medication instructions only on the boxes and not directly on the medication tubes.   If your medication is too expensive, please contact our office at [phone] option 4 or send us a message through MyChart.   We are unable to tell what your co-pay for medications will be in advance as this is different depending on your insurance coverage. However, we may be able to find a substitute medication at lower cost or fill out paperwork to get insurance to cover a needed medication.   If a prior authorization is required to get your medication covered by your insurance company, please allow us 1-2 business days to complete this process.  Drug prices often vary depending on where the prescription is filled and some pharmacies may offer cheaper prices.  The website www.goodrx.com contains coupons for medications through different pharmacies. The prices here do not account for what the cost may be with help from insurance (it may be cheaper with your insurance), but the website can give you the price if you did not use any insurance.  - You can print the associated coupon and take it with   your prescription to the pharmacy.  - You may also stop by our office during regular business hours and pick up a GoodRx coupon card.  - If you need your prescription sent electronically to a different pharmacy, notify our office through [place] MyChart or by phone at [phone] option 4.     Si Usted Necesita Algo Despus de Su Visita  Tambin puede enviarnos un mensaje a travs de MyChart. Por lo general respondemos a los mensajes de MyChart en el transcurso de 1 a 2  das hbiles.  Para renovar recetas, por favor pida a su farmacia que se ponga en contacto con nuestra oficina. Nuestro nmero de fax es el [phone].  Si tiene un asunto urgente cuando la clnica est cerrada y que no puede esperar hasta el siguiente da hbil, puede llamar/localizar a su doctor(a) al nmero que aparece a continuacin.   Por favor, tenga en cuenta que aunque hacemos todo lo posible para estar disponibles para asuntos urgentes fuera del horario de oficina, no estamos disponibles las 24 horas del da, los 7 das de la semana.   Si tiene un problema urgente y no puede comunicarse con nosotros, puede optar por buscar atencin mdica  en el consultorio de su doctor(a), en una clnica privada, en un centro de atencin urgente o en una sala de emergencias.  Si tiene una emergencia mdica, por favor llame inmediatamente al 911 o vaya a la sala de emergencias.  Nmeros de bper  - Dr. Kowalski: [phone]  - Dra. Moye: [phone]  - Dra. Stewart: [phone]  En caso de inclemencias del tiempo, por favor llame a nuestra lnea principal al [phone] para una actualizacin sobre el estado de cualquier retraso o cierre.  Consejos para la medicacin en dermatologa: Por favor, guarde las cajas en las que vienen los medicamentos de uso tpico para ayudarle a seguir las instrucciones sobre dnde y cmo usarlos. Las farmacias generalmente imprimen las instrucciones del medicamento slo en las cajas y no directamente en los tubos del medicamento.   Si su medicamento es muy caro, por favor, pngase en contacto con nuestra oficina llamando al [phone] y presione la opcin 4 o envenos un mensaje a travs de MyChart.   No podemos decirle cul ser su copago por los medicamentos por adelantado ya que esto es diferente dependiendo de la cobertura de su seguro. Sin embargo, es posible que podamos encontrar un medicamento sustituto a menor costo o llenar un formulario para que el  seguro cubra el medicamento que se considera necesario.   Si se requiere una autorizacin previa para que su compaa de seguros cubra su medicamento, por favor permtanos de 1 a 2 das hbiles para completar este proceso.  Los precios de los medicamentos varan con frecuencia dependiendo del lugar de dnde se surte la receta y alguna farmacias pueden ofrecer precios ms baratos.  El sitio web www.goodrx.com tiene cupones para medicamentos de diferentes farmacias. Los precios aqu no tienen en cuenta lo que podra costar con la ayuda del seguro (puede ser ms barato con su seguro), pero el sitio web puede darle el precio si no utiliz ningn seguro.  - Puede imprimir el cupn correspondiente y llevarlo con su receta a la farmacia.  - Tambin puede pasar por nuestra oficina durante el horario de atencin regular y recoger una tarjeta de cupones de GoodRx.  - Si necesita que su receta se enve electrnicamente a una farmacia diferente, informe a nuestra oficina a travs de MyChart de    o por telfono llamando al [phone] y presione la opcin 4.  

## 2022-06-07 NOTE — Progress Notes (Signed)
   Follow-Up Visit   Subjective  Nathan Meyer is a 62 y.o. male who presents for the following: Suture removal  Pathology showed a benign cyst.  The following portions of the chart were reviewed this encounter and updated as appropriate: medications, allergies, medical history  Review of Systems:  No other skin or systemic complaints except as noted in HPI or Assessment and Plan.  Objective  Well appearing patient in no apparent distress; mood and affect are within normal limits.  Areas Examined: The face and L post ear  Relevant physical exam findings are noted in the Assessment and Plan.    Assessment & Plan    Encounter for Removal of Sutures - Incision site is clean, dry and intact. - Wound cleansed, sutures removed, wound cleansed and steri strips applied.  - Discussed pathology results showing a benign cyst. - Patient advised to keep steri-strips dry until they fall off. - Scars remodel for a full year. - Once steri-strips fall off, patient can apply over-the-counter silicone scar cream once to twice a day to help with scar remodeling if desired. - Patient advised to call with any concerns or if they notice any new or changing lesions.  Return in about 1 year (around 06/07/2023) for seb derm follow up .  Maylene Roes, CMA, am acting as scribe for Armida Sans, MD .   Documentation: I have reviewed the above documentation for accuracy and completeness, and I agree with the above.  Armida Sans, MD

## 2022-06-10 ENCOUNTER — Other Ambulatory Visit: Payer: Self-pay | Admitting: Nurse Practitioner

## 2022-06-14 ENCOUNTER — Encounter: Payer: Self-pay | Admitting: Nurse Practitioner

## 2022-06-14 ENCOUNTER — Encounter: Payer: Self-pay | Admitting: Dermatology

## 2022-06-14 ENCOUNTER — Ambulatory Visit (INDEPENDENT_AMBULATORY_CARE_PROVIDER_SITE_OTHER): Payer: PPO | Admitting: Nurse Practitioner

## 2022-06-14 VITALS — BP 112/68 | HR 76 | Temp 98.4°F | Resp 16 | Ht 72.0 in | Wt 221.2 lb

## 2022-06-14 DIAGNOSIS — F419 Anxiety disorder, unspecified: Secondary | ICD-10-CM | POA: Diagnosis not present

## 2022-06-14 DIAGNOSIS — F32A Depression, unspecified: Secondary | ICD-10-CM | POA: Diagnosis not present

## 2022-06-14 DIAGNOSIS — Z79899 Other long term (current) drug therapy: Secondary | ICD-10-CM

## 2022-06-14 DIAGNOSIS — G8929 Other chronic pain: Secondary | ICD-10-CM | POA: Diagnosis not present

## 2022-06-14 DIAGNOSIS — E559 Vitamin D deficiency, unspecified: Secondary | ICD-10-CM | POA: Diagnosis not present

## 2022-06-14 DIAGNOSIS — E538 Deficiency of other specified B group vitamins: Secondary | ICD-10-CM

## 2022-06-14 DIAGNOSIS — E782 Mixed hyperlipidemia: Secondary | ICD-10-CM | POA: Diagnosis not present

## 2022-06-14 DIAGNOSIS — M5442 Lumbago with sciatica, left side: Secondary | ICD-10-CM

## 2022-06-14 DIAGNOSIS — M47816 Spondylosis without myelopathy or radiculopathy, lumbar region: Secondary | ICD-10-CM

## 2022-06-14 DIAGNOSIS — Z0001 Encounter for general adult medical examination with abnormal findings: Secondary | ICD-10-CM | POA: Diagnosis not present

## 2022-06-14 DIAGNOSIS — M5441 Lumbago with sciatica, right side: Secondary | ICD-10-CM | POA: Diagnosis not present

## 2022-06-14 DIAGNOSIS — E291 Testicular hypofunction: Secondary | ICD-10-CM | POA: Diagnosis not present

## 2022-06-14 DIAGNOSIS — R300 Dysuria: Secondary | ICD-10-CM

## 2022-06-14 MED ORDER — TIZANIDINE HCL 2 MG PO TABS
ORAL_TABLET | ORAL | 1 refills | Status: DC
Start: 2022-06-14 — End: 2022-08-30

## 2022-06-14 MED ORDER — OXYCODONE-ACETAMINOPHEN 7.5-325 MG PO TABS
1.0000 | ORAL_TABLET | Freq: Three times a day (TID) | ORAL | 0 refills | Status: DC | PRN
Start: 2022-07-14 — End: 2022-11-04

## 2022-06-14 MED ORDER — OXYCODONE-ACETAMINOPHEN 7.5-325 MG PO TABS
1.0000 | ORAL_TABLET | Freq: Three times a day (TID) | ORAL | 0 refills | Status: DC | PRN
Start: 2022-06-16 — End: 2022-11-04

## 2022-06-14 MED ORDER — OXYCODONE-ACETAMINOPHEN 7.5-325 MG PO TABS
1.0000 | ORAL_TABLET | Freq: Three times a day (TID) | ORAL | 0 refills | Status: DC | PRN
Start: 2022-08-11 — End: 2022-11-04

## 2022-06-14 MED ORDER — ALPRAZOLAM 0.5 MG PO TABS
0.5000 mg | ORAL_TABLET | Freq: Three times a day (TID) | ORAL | 2 refills | Status: DC | PRN
Start: 2022-06-14 — End: 2022-08-30

## 2022-06-14 NOTE — Addendum Note (Signed)
Addended by: Annamaria Helling on: 06/14/2022 04:12 PM   Modules accepted: Orders

## 2022-06-14 NOTE — Progress Notes (Signed)
[facility] [address] [place], [place] [address]  Internal MEDICINE  Office Visit Note  Patient Name: Nathan Meyer  [id number]  [id number]  Date of Service: 06/14/2022  Chief Complaint  Patient presents with   Gastroesophageal Reflux   Hyperlipidemia   Medicare Wellness    HPI Nathan Meyer presents for an annual well visit and physical exam.  Well-appearing 62 y.o. male with osteoarthritis, GERD, hyperlipidemia, anxiety, hypertension, chronic pain, depression  He lives at home with significant other, he is on disability due to depression and chronic back pain. He is working on eating a healthier diet and does some walking for physical activity. He quit smoking cigarettes about 1 year ago.  Routine CRC screening: due in 2030 Labs: due for routine labs now New or worsening pain: chronic pain  Other concerns: had 3 cysts removed from left ear, still healing.  Not as depressed a he was but is having to take care of his mother who has dementia and needs help around the house.       06/14/2022    8:57 AM 06/09/2021    9:41 AM  MMSE - Mini Mental State Exam  Orientation to time 5 5  Orientation to Place 5 5  Registration 3 3  Attention/ Calculation 5 5  Recall 3 3  Language- name 2 objects 2 2  Language- repeat 1 1  Language- follow 3 step command 3 3  Language- read & follow direction 1 1  Write a sentence 1 1  Copy design 1 1  Total score 30 30    Functional Status Survey: Is the patient deaf or have difficulty hearing?: No Does the patient have difficulty seeing, even when wearing glasses/contacts?: Yes Does the patient have difficulty concentrating, remembering, or making decisions?: Yes Does the patient have difficulty walking or climbing stairs?: Yes Does the patient have difficulty dressing or bathing?: No Does the patient have difficulty doing errands alone such as visiting a doctor's office or shopping?: No     08/11/2021    8:45 AM 11/08/2021     8:32 AM 12/07/2021    8:32 AM 03/07/2022    8:33 AM 06/14/2022    8:56 AM  Fall Risk  Falls in the past year? 0 0 0 0 0  Was there an injury with Fall?  0 0 0 0  Fall Risk Category Calculator  0 0 0 0  Fall Risk Category (Retired)  Low Low    (RETIRED) Patient Fall Risk Level Low fall risk Low fall risk Low fall risk    Patient at Risk for Falls Due to No Fall Risks No Fall Risks No Fall Risks No Fall Risks No Fall Risks  Fall risk Follow up Falls evaluation completed Falls evaluation completed Falls evaluation completed Falls evaluation completed Falls evaluation completed       03/07/2022    8:33 AM  Depression screen PHQ 2/9  Decreased Interest 3  Down, Depressed, Hopeless 3  PHQ - 2 Score 6  Altered sleeping 3  Tired, decreased energy 3  Change in appetite 2  Feeling bad or failure about yourself  2  Trouble concentrating 2  Moving slowly or fidgety/restless 0  PHQ-9 Score 18  Difficult doing work/chores Very difficult        No data to display            Current Medication: Outpatient Encounter Medications as of 06/14/2022  Medication Sig   atorvastatin (LIPITOR) 20 MG tablet Take  1 tablet (20 mg total) by mouth at bedtime.   Cholecalciferol (VITAMIN D3 PO) Take by mouth.   Cyanocobalamin (B-12 PO) Take by mouth.   doxycycline (VIBRA-TABS) 100 MG tablet Take 1 tablet (100 mg total) by mouth daily. With food and plenty of fluid   FLUoxetine (PROZAC) 20 MG capsule Take 1 capsule (20 mg total) by mouth daily.   FLUoxetine (PROZAC) 40 MG capsule Take 1 capsule (40 mg total) by mouth daily.   hydrochlorothiazide (MICROZIDE) 12.5 MG capsule Take 1 capsule (12.5 mg total) by mouth every morning.   irbesartan (AVAPRO) 75 MG tablet Take 1 tablet (75 mg total) by mouth daily.   ketoconazole (NIZORAL) 2 % shampoo APPLY 3 TIMES PER WEEK. MASSAGE INTO THESCALP AND LEAVE FOR AT LEAST 5 MINUTES BEFORE RINSING OUT.   lansoprazole (PREVACID) 15 MG capsule Take 1 capsule (15 mg  total) by mouth daily.   minocycline (MINOCIN) 100 MG capsule Take 1 capsule (100 mg total) by mouth daily.   mometasone (ELOCON) 0.1 % lotion Apply topically as directed. 3-4 times per week prn itch   Multiple Vitamin (MULTIVITAMIN ADULT PO) Take by mouth.   mupirocin ointment (BACTROBAN) 2 % Apply 1 Application topically daily. With dressing changes   traZODone (DESYREL) 100 MG tablet Take 1-2 tablets (100-200 mg total) by mouth at bedtime. Takes (2) 100mg  tabs every night   [DISCONTINUED] ALPRAZolam (XANAX) 0.5 MG tablet Take 1 tablet (0.5 mg total) by mouth 3 (three) times daily as needed for anxiety.   [DISCONTINUED] Methylnaltrexone Bromide (RELISTOR) 150 MG TABS Take 3 tablets (450 mg total) by mouth daily as needed (constipation).   [DISCONTINUED] oxyCODONE-acetaminophen (PERCOCET) 7.5-325 MG tablet Take 1 tablet by mouth every 8 (eight) hours as needed for severe pain.   [DISCONTINUED] oxyCODONE-acetaminophen (PERCOCET) 7.5-325 MG tablet Take 1 tablet by mouth every 8 (eight) hours as needed for severe pain.   [DISCONTINUED] oxyCODONE-acetaminophen (PERCOCET) 7.5-325 MG tablet Take 1 tablet by mouth every 8 (eight) hours as needed for severe pain.   [DISCONTINUED] tiZANidine (ZANAFLEX) 2 MG tablet TAKE 1 TABLET BY MOUTH EVERY 6 HOURS AS NEEDED FOR MUSCLE SPASM **NO MORE THAN 4 TABS IN 24 HOURS**   ALPRAZolam (XANAX) 0.5 MG tablet Take 1 tablet (0.5 mg total) by mouth 3 (three) times daily as needed for anxiety.   [START ON 07/14/2022] oxyCODONE-acetaminophen (PERCOCET) 7.5-325 MG tablet Take 1 tablet by mouth every 8 (eight) hours as needed for severe pain.   [START ON 06/16/2022] oxyCODONE-acetaminophen (PERCOCET) 7.5-325 MG tablet Take 1 tablet by mouth every 8 (eight) hours as needed for severe pain.   [START ON 08/11/2022] oxyCODONE-acetaminophen (PERCOCET) 7.5-325 MG tablet Take 1 tablet by mouth every 8 (eight) hours as needed for severe pain.   tiZANidine (ZANAFLEX) 2 MG tablet TAKE 1  TABLET BY MOUTH EVERY 6 HOURS AS NEEDED FOR MUSCLE SPASM **NO MORE THAN 4 TABS IN 24 HOURS**   [DISCONTINUED] carisoprodol (SOMA) 350 MG tablet Take 350 mg by mouth 2 (two) times daily.   No facility-administered encounter medications on file as of 06/14/2022.    Surgical History: Past Surgical History:  Procedure Laterality Date   ANTERIOR FUSION CERVICAL SPINE  1995   finger debridement      Medical History: Past Medical History:  Diagnosis Date   Arthritis    Cyst, kidney, acquired    GERD (gastroesophageal reflux disease)    Hyperlipidemia    Neuromuscular disorder (HCC)    Spine degeneration    Substance  abuse (HCC)     Family History: Family History  Problem Relation Age of Onset   Arthritis Mother    Hypertension Mother    Hyperlipidemia Mother    Anxiety disorder Mother    Depression Father    COPD Father    Alcohol abuse Maternal Uncle    Alcohol abuse Paternal Uncle    Alcohol abuse Paternal Grandfather    Alcohol abuse Cousin     Social History   Socioeconomic History   Marital status: Divorced    Spouse name: Not on file   Number of children: Not on file   Years of education: Not on file   Highest education level: Not on file  Occupational History   Not on file  Tobacco Use   Smoking status: Former    Packs/day: 1.00    Years: 36.00    Additional pack years: 0.00    Total pack years: 36.00    Types: Cigarettes    Start date: 7    Quit date: 05/29/2021    Years since quitting: 1.0   Smokeless tobacco: Never  Substance and Sexual Activity   Alcohol use: No   Drug use: No    Comment: former substance abuser - cocaine, pills, marijuana   Sexual activity: Not on file  Other Topics Concern   Not on file  Social History Narrative   Not on file   Social Determinants of Health   Financial Resource Strain: Not on file  Food Insecurity: Not on file  Transportation Needs: Not on file  Physical Activity: Not on file  Stress: Not on file   Social Connections: Not on file  Intimate Partner Violence: Not on file      Review of Systems  Constitutional:  Positive for fatigue. Negative for activity change, appetite change, chills, fever and unexpected weight change.  HENT: Negative.  Negative for congestion, ear pain, rhinorrhea, sore throat and trouble swallowing.   Eyes: Negative.   Respiratory: Negative.  Negative for cough, chest tightness, shortness of breath and wheezing.   Cardiovascular: Negative.  Negative for chest pain.  Gastrointestinal: Negative.  Negative for abdominal pain, blood in stool, constipation, diarrhea, nausea and vomiting.  Endocrine: Negative.   Genitourinary: Negative.  Negative for difficulty urinating, dysuria, frequency, hematuria and urgency.  Musculoskeletal:  Positive for arthralgias, back pain, myalgias and neck pain. Negative for joint swelling.  Skin: Negative.  Negative for rash and wound.  Allergic/Immunologic: Negative.  Negative for immunocompromised state.  Neurological: Negative.  Negative for dizziness, seizures, numbness and headaches.  Hematological: Negative.   Psychiatric/Behavioral:  Positive for behavioral problems and sleep disturbance. Negative for self-injury and suicidal ideas. The patient is nervous/anxious.     Vital Signs: BP 112/68   Pulse 76   Temp 98.4 F (36.9 C)   Resp 16   Ht 6' (1.829 m)   Wt 221 lb 3.2 oz (100.3 kg)   SpO2 93%   BMI 30.00 kg/m    Physical Exam Vitals reviewed.  Constitutional:      General: He is awake. He is not in acute distress.    Appearance: Normal appearance. He is well-developed, well-groomed and overweight. He is not ill-appearing or diaphoretic.  HENT:     Head: Normocephalic and atraumatic.     Right Ear: Tympanic membrane, ear canal and external ear normal.     Left Ear: Tympanic membrane, ear canal and external ear normal.     Nose: Nose normal. No congestion or rhinorrhea.  Mouth/Throat:     Lips: Pink.      Mouth: Mucous membranes are moist.     Pharynx: Oropharynx is clear. Uvula midline. No oropharyngeal exudate or posterior oropharyngeal erythema.  Eyes:     General: Lids are normal. Vision grossly intact. Gaze aligned appropriately. No scleral icterus.       Right eye: No discharge.        Left eye: No discharge.     Extraocular Movements: Extraocular movements intact.     Conjunctiva/sclera: Conjunctivae normal.     Pupils: Pupils are equal, round, and reactive to light.     Funduscopic exam:    Right eye: Red reflex present.        Left eye: Red reflex present. Neck:     Thyroid: No thyromegaly.     Vascular: No JVD.     Trachea: Trachea and phonation normal. No tracheal deviation.  Cardiovascular:     Rate and Rhythm: Normal rate and regular rhythm.     Pulses: Normal pulses.     Heart sounds: Normal heart sounds, S1 normal and S2 normal. No murmur heard.    No friction rub. No gallop.  Pulmonary:     Effort: Pulmonary effort is normal. No accessory muscle usage or respiratory distress.     Breath sounds: Normal breath sounds and air entry. No stridor. No wheezing or rales.  Chest:     Chest wall: No tenderness.  Abdominal:     General: Bowel sounds are normal. There is no distension.     Palpations: Abdomen is soft. There is no mass.     Tenderness: There is no abdominal tenderness. There is no guarding or rebound.  Musculoskeletal:        General: No tenderness or deformity. Normal range of motion.     Cervical back: Normal range of motion and neck supple.     Right lower leg: No edema.     Left lower leg: No edema.  Lymphadenopathy:     Cervical: No cervical adenopathy.  Skin:    General: Skin is warm and dry.     Capillary Refill: Capillary refill takes less than 2 seconds.     Coloration: Skin is not pale.     Findings: No erythema or rash.     Comments: Multiple atypical moles noted on back and chest  Neurological:     Mental Status: He is alert and oriented to  person, place, and time.     Cranial Nerves: No cranial nerve deficit.     Motor: No abnormal muscle tone.     Coordination: Coordination normal.     Gait: Gait normal.     Deep Tendon Reflexes: Reflexes are normal and symmetric.  Psychiatric:        Mood and Affect: Mood normal.        Behavior: Behavior normal. Behavior is cooperative.        Thought Content: Thought content normal.        Judgment: Judgment normal.        Assessment/Plan: 1. Encounter for routine adult health examination with abnormal findings Age-appropriate preventive screenings and vaccinations discussed, annual physical exam completed. Routine labs for health maintenance ordered, see below. PHM updated.  - CBC with Differential/Platelet - Hgb A1C w/o eAG - Lipid Profile - CMP14+EGFR - Testosterone,Free and Total - B12 and Folate Panel - Vitamin D (25 hydroxy)  2. Chronic bilateral low back pain with bilateral sciatica Routine lab ordered - CBC  with Differential/Platelet - Hgb A1C w/o eAG - Lipid Profile - CMP14+EGFR - Testosterone,Free and Total - B12 and Folate Panel - Vitamin D (25 hydroxy)  3. Hypogonadism in male Routine lab ordered - Testosterone,Free and Total  4. Mixed hyperlipidemia Routine labs ordered - CBC with Differential/Platelet - Hgb A1C w/o eAG - Lipid Profile - CMP14+EGFR - Testosterone,Free and Total - B12 and Folate Panel - Vitamin D (25 hydroxy)  5. B12 deficiency Routine labs ordered - CBC with Differential/Platelet - B12 and Folate Panel  6. Vitamin D deficiency Routine labs ordered - Vitamin D (25 hydroxy)  7. Encounter for medication review Medication list reviewed with patient, updated, refills ordered - oxyCODONE-acetaminophen (PERCOCET) 7.5-325 MG tablet; Take 1 tablet by mouth every 8 (eight) hours as needed for severe pain.  Dispense: 90 tablet; Refill: 0 - oxyCODONE-acetaminophen (PERCOCET) 7.5-325 MG tablet; Take 1 tablet by mouth every 8 (eight)  hours as needed for severe pain.  Dispense: 90 tablet; Refill: 0 - oxyCODONE-acetaminophen (PERCOCET) 7.5-325 MG tablet; Take 1 tablet by mouth every 8 (eight) hours as needed for severe pain.  Dispense: 90 tablet; Refill: 0 - ALPRAZolam (XANAX) 0.5 MG tablet; Take 1 tablet (0.5 mg total) by mouth 3 (three) times daily as needed for anxiety.  Dispense: 90 tablet; Refill: 2 - tiZANidine (ZANAFLEX) 2 MG tablet; TAKE 1 TABLET BY MOUTH EVERY 6 HOURS AS NEEDED FOR MUSCLE SPASM **NO MORE THAN 4 TABS IN 24 HOURS**  Dispense: 120 tablet; Refill: 1     General Counseling: Nathan Meyer verbalizes understanding of the findings of todays visit and agrees with plan of treatment. I have discussed any further diagnostic evaluation that may be needed or ordered today. We also reviewed his medications today. he has been encouraged to call the office with any questions or concerns that should arise related to todays visit.    Orders Placed This Encounter  Procedures   CBC with Differential/Platelet   Hgb A1C w/o eAG   Lipid Profile   CMP14+EGFR   Testosterone,Free and Total   B12 and Folate Panel   Vitamin D (25 hydroxy)    Meds ordered this encounter  Medications   oxyCODONE-acetaminophen (PERCOCET) 7.5-325 MG tablet    Sig: Take 1 tablet by mouth every 8 (eight) hours as needed for severe pain.    Dispense:  90 tablet    Refill:  0    For refill on 07/14/22   oxyCODONE-acetaminophen (PERCOCET) 7.5-325 MG tablet    Sig: Take 1 tablet by mouth every 8 (eight) hours as needed for severe pain.    Dispense:  90 tablet    Refill:  0    For next refill on 06/16/22, Chronic med   oxyCODONE-acetaminophen (PERCOCET) 7.5-325 MG tablet    Sig: Take 1 tablet by mouth every 8 (eight) hours as needed for severe pain.    Dispense:  90 tablet    Refill:  0    Fill for August 11, 2022   ALPRAZolam Prudy Feeler) 0.5 MG tablet    Sig: Take 1 tablet (0.5 mg total) by mouth 3 (three) times daily as needed for anxiety.     Dispense:  90 tablet    Refill:  2   tiZANidine (ZANAFLEX) 2 MG tablet    Sig: TAKE 1 TABLET BY MOUTH EVERY 6 HOURS AS NEEDED FOR MUSCLE SPASM **NO MORE THAN 4 TABS IN 24 HOURS**    Dispense:  120 tablet    Refill:  1    FOR NEXT  FILL. THANK YOU.    Return in about 11 weeks (around 08/30/2022) for F/U, pain med refill, Randalyn Ahmed PCP, due for UDS next visit.   Total time spent:30 Minutes Time spent includes review of chart, medications, test results, and follow up plan with the patient.   [place] Controlled Substance Database was reviewed by me.  This patient was seen by Sallyanne Kuster, FNP-C in collaboration with Dr. Beverely Risen as a part of collaborative care agreement.  Aliyah Abeyta R. Tedd Sias, MSN, FNP-C Internal medicine

## 2022-06-15 LAB — MICROSCOPIC EXAMINATION
Bacteria, UA: NONE SEEN
Casts: NONE SEEN /lpf
Epithelial Cells (non renal): NONE SEEN /hpf (ref 0–10)
RBC, Urine: NONE SEEN /hpf (ref 0–2)
WBC, UA: NONE SEEN /hpf (ref 0–5)

## 2022-06-15 LAB — UA/M W/RFLX CULTURE, ROUTINE
Bilirubin, UA: NEGATIVE
Glucose, UA: NEGATIVE
Ketones, UA: NEGATIVE
Leukocytes,UA: NEGATIVE
Nitrite, UA: NEGATIVE
Protein,UA: NEGATIVE
RBC, UA: NEGATIVE
Specific Gravity, UA: 1.005 (ref 1.005–1.030)
Urobilinogen, Ur: 0.2 mg/dL (ref 0.2–1.0)
pH, UA: 7 (ref 5.0–7.5)

## 2022-06-16 DIAGNOSIS — F419 Anxiety disorder, unspecified: Secondary | ICD-10-CM | POA: Diagnosis not present

## 2022-06-16 DIAGNOSIS — Z0001 Encounter for general adult medical examination with abnormal findings: Secondary | ICD-10-CM | POA: Diagnosis not present

## 2022-06-16 DIAGNOSIS — F32A Depression, unspecified: Secondary | ICD-10-CM | POA: Diagnosis not present

## 2022-06-16 DIAGNOSIS — M47816 Spondylosis without myelopathy or radiculopathy, lumbar region: Secondary | ICD-10-CM | POA: Diagnosis not present

## 2022-06-16 DIAGNOSIS — M5442 Lumbago with sciatica, left side: Secondary | ICD-10-CM | POA: Diagnosis not present

## 2022-06-16 DIAGNOSIS — E559 Vitamin D deficiency, unspecified: Secondary | ICD-10-CM | POA: Diagnosis not present

## 2022-06-16 DIAGNOSIS — G8929 Other chronic pain: Secondary | ICD-10-CM | POA: Diagnosis not present

## 2022-06-16 DIAGNOSIS — E538 Deficiency of other specified B group vitamins: Secondary | ICD-10-CM | POA: Diagnosis not present

## 2022-06-16 DIAGNOSIS — E782 Mixed hyperlipidemia: Secondary | ICD-10-CM | POA: Diagnosis not present

## 2022-06-16 DIAGNOSIS — M5441 Lumbago with sciatica, right side: Secondary | ICD-10-CM | POA: Diagnosis not present

## 2022-06-16 DIAGNOSIS — E291 Testicular hypofunction: Secondary | ICD-10-CM | POA: Diagnosis not present

## 2022-06-17 ENCOUNTER — Encounter: Payer: Self-pay | Admitting: Nurse Practitioner

## 2022-06-17 LAB — CMP14+EGFR
Alkaline Phosphatase: 86 IU/L (ref 44–121)
Bilirubin Total: 0.7 mg/dL (ref 0.0–1.2)
CO2: 26 mmol/L (ref 20–29)
Chloride: 96 mmol/L (ref 96–106)
Sodium: 137 mmol/L (ref 134–144)
Total Protein: 7.5 g/dL (ref 6.0–8.5)

## 2022-06-17 LAB — LIPID PANEL
Chol/HDL Ratio: 4.6 ratio (ref 0.0–5.0)
Cholesterol, Total: 160 mg/dL (ref 100–199)
Triglycerides: 195 mg/dL — ABNORMAL HIGH (ref 0–149)

## 2022-06-17 LAB — TESTOSTERONE,FREE AND TOTAL: Testosterone: 251 ng/dL — ABNORMAL LOW (ref 264–916)

## 2022-06-17 LAB — B12 AND FOLATE PANEL: Folate: 15.6 ng/mL (ref >3.0)

## 2022-06-17 LAB — CBC WITH DIFFERENTIAL/PLATELET
Basophils Absolute: 0.1 10*3/uL (ref 0.0–0.2)
Hematocrit: 43.2 % (ref 37.5–51.0)
MCHC: 34.5 g/dL (ref 31.5–35.7)
Neutrophils: 54 %

## 2022-06-17 LAB — HGB A1C W/O EAG: Hgb A1c MFr Bld: 5.8 % — ABNORMAL HIGH (ref 4.8–5.6)

## 2022-06-22 LAB — CBC WITH DIFFERENTIAL/PLATELET
Basos: 1 %
EOS (ABSOLUTE): 0.3 10*3/uL (ref 0.0–0.4)
Eos: 5 %
Hemoglobin: 14.9 g/dL (ref 13.0–17.7)
Immature Grans (Abs): 0 10*3/uL (ref 0.0–0.1)
Immature Granulocytes: 0 %
Lymphocytes Absolute: 1.9 10*3/uL (ref 0.7–3.1)
Lymphs: 31 %
MCH: 31.8 pg (ref 26.6–33.0)
MCV: 92 fL (ref 79–97)
Monocytes Absolute: 0.5 10*3/uL (ref 0.1–0.9)
Monocytes: 9 %
Neutrophils Absolute: 3.4 10*3/uL (ref 1.4–7.0)
Platelets: 216 10*3/uL (ref 150–450)
RBC: 4.69 x10E6/uL (ref 4.14–5.80)
RDW: 12.3 % (ref 11.6–15.4)
WBC: 6.1 10*3/uL (ref 3.4–10.8)

## 2022-06-22 LAB — B12 AND FOLATE PANEL: Vitamin B-12: 1013 pg/mL (ref 232–1245)

## 2022-06-22 LAB — VITAMIN D 25 HYDROXY (VIT D DEFICIENCY, FRACTURES): Vit D, 25-Hydroxy: 62.8 ng/mL (ref 30.0–100.0)

## 2022-06-22 LAB — CMP14+EGFR
ALT: 34 IU/L (ref 0–44)
AST: 25 IU/L (ref 0–40)
Albumin/Globulin Ratio: 1.5 (ref 1.2–2.2)
Albumin: 4.5 g/dL (ref 3.9–4.9)
BUN/Creatinine Ratio: 14 (ref 10–24)
BUN: 15 mg/dL (ref 8–27)
Calcium: 9.5 mg/dL (ref 8.6–10.2)
Creatinine, Ser: 1.06 mg/dL (ref 0.76–1.27)
Globulin, Total: 3 g/dL (ref 1.5–4.5)
Glucose: 109 mg/dL — ABNORMAL HIGH (ref 70–99)
Potassium: 4.7 mmol/L (ref 3.5–5.2)
eGFR: 79 mL/min/{1.73_m2} (ref >59)

## 2022-06-22 LAB — LIPID PANEL
HDL: 35 mg/dL — ABNORMAL LOW (ref >39)
LDL Chol Calc (NIH): 91 mg/dL (ref 0–99)
VLDL Cholesterol Cal: 34 mg/dL (ref 5–40)

## 2022-07-03 NOTE — Progress Notes (Signed)
Most labs are normal except:  --A1c is 5.8 which is prediabetic, glucose is 109 as well --triglycerides is still slightly high and HDL is slightly low --testosterone is low -- if interested, we could try testosterone gel instead of injections?

## 2022-08-11 ENCOUNTER — Other Ambulatory Visit: Payer: Self-pay | Admitting: Nurse Practitioner

## 2022-08-11 DIAGNOSIS — Z79899 Other long term (current) drug therapy: Secondary | ICD-10-CM

## 2022-08-16 NOTE — Telephone Encounter (Signed)
Please send

## 2022-08-30 ENCOUNTER — Encounter: Payer: Self-pay | Admitting: Nurse Practitioner

## 2022-08-30 ENCOUNTER — Telehealth: Payer: Self-pay

## 2022-08-30 ENCOUNTER — Ambulatory Visit: Payer: PPO | Admitting: Nurse Practitioner

## 2022-08-30 VITALS — BP 110/82 | HR 90 | Temp 98.5°F | Resp 16 | Ht 72.0 in | Wt 211.4 lb

## 2022-08-30 DIAGNOSIS — F32A Depression, unspecified: Secondary | ICD-10-CM | POA: Diagnosis not present

## 2022-08-30 DIAGNOSIS — M5442 Lumbago with sciatica, left side: Secondary | ICD-10-CM | POA: Diagnosis not present

## 2022-08-30 DIAGNOSIS — M5441 Lumbago with sciatica, right side: Secondary | ICD-10-CM | POA: Diagnosis not present

## 2022-08-30 DIAGNOSIS — F419 Anxiety disorder, unspecified: Secondary | ICD-10-CM | POA: Diagnosis not present

## 2022-08-30 DIAGNOSIS — E291 Testicular hypofunction: Secondary | ICD-10-CM | POA: Diagnosis not present

## 2022-08-30 DIAGNOSIS — Z658 Other specified problems related to psychosocial circumstances: Secondary | ICD-10-CM

## 2022-08-30 DIAGNOSIS — Z79899 Other long term (current) drug therapy: Secondary | ICD-10-CM

## 2022-08-30 DIAGNOSIS — G8929 Other chronic pain: Secondary | ICD-10-CM | POA: Diagnosis not present

## 2022-08-30 LAB — POCT URINE DRUG SCREEN
Methylenedioxyamphetamine: NOT DETECTED
POC Amphetamine UR: NOT DETECTED
POC BENZODIAZEPINES UR: POSITIVE — AB
POC Barbiturate UR: NOT DETECTED
POC Cocaine UR: NOT DETECTED
POC Ecstasy UR: NOT DETECTED
POC Marijuana UR: NOT DETECTED
POC Methadone UR: NOT DETECTED
POC Methamphetamine UR: NOT DETECTED
POC Opiate Ur: NOT DETECTED
POC Oxycodone UR: POSITIVE — AB
POC PHENCYCLIDINE UR: NOT DETECTED
POC TRICYCLICS UR: NOT DETECTED

## 2022-08-30 MED ORDER — TESTOSTERONE CYPIONATE 200 MG/ML IM SOLN
100.0000 mg | INTRAMUSCULAR | 0 refills | Status: DC
Start: 2022-08-30 — End: 2022-11-23

## 2022-08-30 MED ORDER — BD SYRINGE/NEEDLE 23G X 1" 3 ML MISC
1.0000 | 0 refills | Status: DC
Start: 2022-08-30 — End: 2023-01-19

## 2022-08-30 MED ORDER — OXYCODONE-ACETAMINOPHEN 10-325 MG PO TABS
1.0000 | ORAL_TABLET | Freq: Three times a day (TID) | ORAL | 0 refills | Status: DC | PRN
Start: 2022-10-25 — End: 2022-11-23

## 2022-08-30 MED ORDER — OXYCODONE-ACETAMINOPHEN 10-325 MG PO TABS
1.0000 | ORAL_TABLET | Freq: Three times a day (TID) | ORAL | 0 refills | Status: DC | PRN
Start: 2022-09-27 — End: 2022-11-23

## 2022-08-30 MED ORDER — TIZANIDINE HCL 2 MG PO TABS
ORAL_TABLET | ORAL | 1 refills | Status: DC
Start: 2022-08-30 — End: 2022-11-23

## 2022-08-30 MED ORDER — OXYCODONE-ACETAMINOPHEN 10-325 MG PO TABS
1.0000 | ORAL_TABLET | Freq: Three times a day (TID) | ORAL | 0 refills | Status: DC | PRN
Start: 2022-08-30 — End: 2022-11-23

## 2022-08-30 MED ORDER — ALPRAZOLAM 0.5 MG PO TABS
0.5000 mg | ORAL_TABLET | Freq: Three times a day (TID) | ORAL | 2 refills | Status: DC | PRN
Start: 2022-08-30 — End: 2022-11-23

## 2022-08-30 NOTE — Telephone Encounter (Signed)
Completed P.A. for patient's DEPOTESTOSTERONE CYPIONATE.

## 2022-08-30 NOTE — Progress Notes (Signed)
[facility] [address] [place], [place] [address]  Internal MEDICINE  Office Visit Note  Patient Name: Nathan Meyer  [id number]  [id number]  Date of Service: 08/30/2022  Chief Complaint  Patient presents with   Follow-up    Pain med refill    HPI Nathan Meyer presents for a follow-up visit for chronic pain, anxiety, refills.  Having financial and relationship stressors-- issues with girlfriend, mother's dementia is worsening, cousin's dog ran off last night, nephew got in trouble with marijuana and speeding. Girlfriend came home with a 24 week old kitten after having to put her cat down.  Feels like he has a Negative and bad attitude and never wants to get off the couch Low testosterone -- wants to go back to injections.  Chronic pain -- has been taking more of his pain medication lately.  Anxiety -- still taking about 0.5 mg 3 times daily of alprazolam.      Current Medication: Outpatient Encounter Medications as of 08/30/2022  Medication Sig   atorvastatin (LIPITOR) 20 MG tablet Take 1 tablet (20 mg total) by mouth at bedtime.   Cholecalciferol (VITAMIN D3 PO) Take by mouth.   Cyanocobalamin (B-12 PO) Take by mouth.   doxycycline (VIBRA-TABS) 100 MG tablet Take 1 tablet (100 mg total) by mouth daily. With food and plenty of fluid   FLUoxetine (PROZAC) 20 MG capsule Take 1 capsule (20 mg total) by mouth daily.   FLUoxetine (PROZAC) 40 MG capsule Take 1 capsule (40 mg total) by mouth daily.   hydrochlorothiazide (MICROZIDE) 12.5 MG capsule Take 1 capsule (12.5 mg total) by mouth every morning.   irbesartan (AVAPRO) 75 MG tablet Take 1 tablet (75 mg total) by mouth daily.   ketoconazole (NIZORAL) 2 % shampoo APPLY 3 TIMES PER WEEK. MASSAGE INTO THESCALP AND LEAVE FOR AT LEAST 5 MINUTES BEFORE RINSING OUT.   lansoprazole (PREVACID) 15 MG capsule Take 1 capsule (15 mg total) by mouth daily.   minocycline (MINOCIN) 100 MG capsule Take 1 capsule (100 mg total) by mouth  daily.   mometasone (ELOCON) 0.1 % lotion Apply topically as directed. 3-4 times per week prn itch   Multiple Vitamin (MULTIVITAMIN ADULT PO) Take by mouth.   mupirocin ointment (BACTROBAN) 2 % Apply 1 Application topically daily. With dressing changes   oxyCODONE-acetaminophen (PERCOCET) 10-325 MG tablet Take 1 tablet by mouth every 8 (eight) hours as needed for pain.   [START ON 09/27/2022] oxyCODONE-acetaminophen (PERCOCET) 10-325 MG tablet Take 1 tablet by mouth every 8 (eight) hours as needed for pain.   [START ON 10/25/2022] oxyCODONE-acetaminophen (PERCOCET) 10-325 MG tablet Take 1 tablet by mouth every 8 (eight) hours as needed for pain.   oxyCODONE-acetaminophen (PERCOCET) 7.5-325 MG tablet Take 1 tablet by mouth every 8 (eight) hours as needed for severe pain.   oxyCODONE-acetaminophen (PERCOCET) 7.5-325 MG tablet Take 1 tablet by mouth every 8 (eight) hours as needed for severe pain.   oxyCODONE-acetaminophen (PERCOCET) 7.5-325 MG tablet Take 1 tablet by mouth every 8 (eight) hours as needed for severe pain.   testosterone cypionate (DEPOTESTOSTERONE CYPIONATE) 200 MG/ML injection Inject 0.5 mLs (100 mg total) into the muscle every 28 (twenty-eight) days.   traZODone (DESYREL) 100 MG tablet Take 1-2 tablets (100-200 mg total) by mouth at bedtime. Takes (2) 100mg  tabs every night   [DISCONTINUED] ALPRAZolam (XANAX) 0.5 MG tablet Take 1 tablet (0.5 mg total) by mouth 3 (three) times daily as needed for anxiety.   [DISCONTINUED] tiZANidine (ZANAFLEX) 2  MG tablet TAKE 1 TABLET BY MOUTH EVERY 6 HOURS AS NEEDED FOR MUSCLE SPASM **NO MORE THAN 4 TABS IN 24 HOURS**   ALPRAZolam (XANAX) 0.5 MG tablet Take 1 tablet (0.5 mg total) by mouth 3 (three) times daily as needed for anxiety.   SYRINGE-NEEDLE, DISP, 3 ML (B-D SYRINGE/NEEDLE 3CC/23GX1") 23G X 1" 3 ML MISC 1 Device by Does not apply route every 14 (fourteen) days. To be used for IM testosterone injection.   tiZANidine (ZANAFLEX) 2 MG tablet TAKE  1 TABLET BY MOUTH EVERY 6 HOURS AS NEEDED FOR MUSCLE SPASM **NO MORE THAN 4 TABS IN 24 HOURS**   No facility-administered encounter medications on file as of 08/30/2022.    Surgical History: Past Surgical History:  Procedure Laterality Date   ANTERIOR FUSION CERVICAL SPINE  1995   finger debridement      Medical History: Past Medical History:  Diagnosis Date   Arthritis    Cyst, kidney, acquired    GERD (gastroesophageal reflux disease)    Hyperlipidemia    Neuromuscular disorder (HCC)    Spine degeneration    Substance abuse (HCC)     Family History: Family History  Problem Relation Age of Onset   Arthritis Mother    Hypertension Mother    Hyperlipidemia Mother    Anxiety disorder Mother    Depression Father    COPD Father    Alcohol abuse Maternal Uncle    Alcohol abuse Paternal Uncle    Alcohol abuse Paternal Grandfather    Alcohol abuse Cousin     Social History   Socioeconomic History   Marital status: Divorced    Spouse name: Not on file   Number of children: Not on file   Years of education: Not on file   Highest education level: Not on file  Occupational History   Not on file  Tobacco Use   Smoking status: Former    Current packs/day: 0.00    Average packs/day: 1 pack/day for 38.3 years (38.3 ttl pk-yrs)    Types: Cigarettes    Start date: 89    Quit date: 05/29/2021    Years since quitting: 1.2   Smokeless tobacco: Never  Substance and Sexual Activity   Alcohol use: No   Drug use: No    Comment: former substance abuser - cocaine, pills, marijuana   Sexual activity: Not on file  Other Topics Concern   Not on file  Social History Narrative   Not on file   Social Determinants of Health   Financial Resource Strain: Not on file  Food Insecurity: Not on file  Transportation Needs: Not on file  Physical Activity: Not on file  Stress: Not on file  Social Connections: Not on file  Intimate Partner Violence: Not on file      Review of  Systems  Constitutional:  Negative for chills, fatigue and unexpected weight change.  HENT:  Negative for congestion, rhinorrhea, sneezing and sore throat.   Eyes:  Negative for redness.  Respiratory: Negative.  Negative for cough, chest tightness, shortness of breath and wheezing.   Cardiovascular: Negative.  Negative for chest pain and palpitations.  Gastrointestinal:  Negative for abdominal pain, constipation, diarrhea, nausea and vomiting.  Genitourinary:  Negative for dysuria and frequency.  Musculoskeletal:  Positive for arthralgias and back pain. Negative for joint swelling and neck pain.  Neurological: Negative.  Negative for tremors and numbness.  Hematological:  Negative for adenopathy. Does not bruise/bleed easily.  Psychiatric/Behavioral:  Positive for behavioral  problems (Depression) and decreased concentration. Negative for self-injury, sleep disturbance and suicidal ideas. The patient is nervous/anxious.     Vital Signs: BP 110/82   Pulse (!) 102   Temp 98.5 F (36.9 C)   Resp 16   Ht 6' (1.829 m)   Wt 211 lb 6.4 oz (95.9 kg)   SpO2 95%   BMI 28.67 kg/m    Physical Exam Vitals reviewed.  Constitutional:      General: He is not in acute distress.    Appearance: Normal appearance. He is obese. He is not ill-appearing.  HENT:     Head: Normocephalic and atraumatic.  Eyes:     Pupils: Pupils are equal, round, and reactive to light.  Cardiovascular:     Rate and Rhythm: Normal rate and regular rhythm.  Pulmonary:     Effort: Pulmonary effort is normal. No respiratory distress.  Neurological:     Mental Status: He is alert and oriented to person, place, and time.  Psychiatric:        Mood and Affect: Mood normal.        Behavior: Behavior normal.        Assessment/Plan: 1. Hypogonadism in male Restart testosterone injections once every 28 days, follow up with repeat labs in 3 months.  - testosterone cypionate (DEPOTESTOSTERONE CYPIONATE) 200 MG/ML  injection; Inject 0.5 mLs (100 mg total) into the muscle every 28 (twenty-eight) days.  Dispense: 10 mL; Refill: 0 - SYRINGE-NEEDLE, DISP, 3 ML (B-D SYRINGE/NEEDLE 3CC/23GX1") 23G X 1" 3 ML MISC; 1 Device by Does not apply route every 14 (fourteen) days. To be used for IM testosterone injection.  Dispense: 50 each; Refill: 0  2. Chronic bilateral low back pain with bilateral sciatica Increased dose of percocet, continue percocet as prescribed. Continue tizanidine as prescribed.  - oxyCODONE-acetaminophen (PERCOCET) 10-325 MG tablet; Take 1 tablet by mouth every 8 (eight) hours as needed for pain.  Dispense: 90 tablet; Refill: 0 - oxyCODONE-acetaminophen (PERCOCET) 10-325 MG tablet; Take 1 tablet by mouth every 8 (eight) hours as needed for pain.  Dispense: 90 tablet; Refill: 0 - oxyCODONE-acetaminophen (PERCOCET) 10-325 MG tablet; Take 1 tablet by mouth every 8 (eight) hours as needed for pain.  Dispense: 90 tablet; Refill: 0 - tiZANidine (ZANAFLEX) 2 MG tablet; TAKE 1 TABLET BY MOUTH EVERY 6 HOURS AS NEEDED FOR MUSCLE SPASM **NO MORE THAN 4 TABS IN 24 HOURS**  Dispense: 120 tablet; Refill: 1  3. Anxiety and depression Continue alprazolam as prescribed. Follow up in 3 months for additional refills.  - ALPRAZolam (XANAX) 0.5 MG tablet; Take 1 tablet (0.5 mg total) by mouth 3 (three) times daily as needed for anxiety.  Dispense: 90 tablet; Refill: 2  4. Psychosocial stressors Increased stress in the home and personally for the patient.   5. Encounter for long-term current use of high risk medication UDS done today, positive for benzo and oxycodone which is consistent with current prescriptions.  - POCT Urine Drug Screen   General Counseling: Valery verbalizes understanding of the findings of todays visit and agrees with plan of treatment. I have discussed any further diagnostic evaluation that may be needed or ordered today. We also reviewed his medications today. he has been encouraged to call  the office with any questions or concerns that should arise related to todays visit.    No orders of the defined types were placed in this encounter.   Meds ordered this encounter  Medications   oxyCODONE-acetaminophen (PERCOCET) 10-325 MG tablet  Sig: Take 1 tablet by mouth every 8 (eight) hours as needed for pain.    Dispense:  90 tablet    Refill:  0    Note increase dose, fill for july   oxyCODONE-acetaminophen (PERCOCET) 10-325 MG tablet    Sig: Take 1 tablet by mouth every 8 (eight) hours as needed for pain.    Dispense:  90 tablet    Refill:  0    Note increase dose, fill for august   oxyCODONE-acetaminophen (PERCOCET) 10-325 MG tablet    Sig: Take 1 tablet by mouth every 8 (eight) hours as needed for pain.    Dispense:  90 tablet    Refill:  0    Note increase dose, fill for september   ALPRAZolam (XANAX) 0.5 MG tablet    Sig: Take 1 tablet (0.5 mg total) by mouth 3 (three) times daily as needed for anxiety.    Dispense:  90 tablet    Refill:  2   tiZANidine (ZANAFLEX) 2 MG tablet    Sig: TAKE 1 TABLET BY MOUTH EVERY 6 HOURS AS NEEDED FOR MUSCLE SPASM **NO MORE THAN 4 TABS IN 24 HOURS**    Dispense:  120 tablet    Refill:  1    FOR NEXT FILL. THANK YOU.   testosterone cypionate (DEPOTESTOSTERONE CYPIONATE) 200 MG/ML injection    Sig: Inject 0.5 mLs (100 mg total) into the muscle every 28 (twenty-eight) days.    Dispense:  10 mL    Refill:  0    Fill new script today with syringes   SYRINGE-NEEDLE, DISP, 3 ML (B-D SYRINGE/NEEDLE 3CC/23GX1") 23G X 1" 3 ML MISC    Sig: 1 Device by Does not apply route every 14 (fourteen) days. To be used for IM testosterone injection.    Dispense:  50 each    Refill:  0    Please send 1 box, no refills for now with first fill of testosterone cypionate    Return in about 3 months (around 11/23/2022) for F/U, pain med refill, anxiety med refill, Makaria Poarch PCP.   Total time spent:30 Minutes Time spent includes review of chart,  medications, test results, and follow up plan with the patient.   [place] Controlled Substance Database was reviewed by me.  This patient was seen by Sallyanne Kuster, FNP-C in collaboration with Dr. Beverely Risen as a part of collaborative care agreement.   Destyne Goodreau R. Tedd Sias, MSN, FNP-C Internal medicine

## 2022-09-02 ENCOUNTER — Telehealth: Payer: Self-pay

## 2022-09-02 NOTE — Telephone Encounter (Signed)
Completed appeal for patient' Testosterone Cypionate.

## 2022-09-05 ENCOUNTER — Telehealth: Payer: Self-pay

## 2022-09-05 NOTE — Telephone Encounter (Signed)
Patient approved for Testosterone Cypionate through 09/02/2023.

## 2022-11-03 ENCOUNTER — Telehealth: Payer: Self-pay

## 2022-11-03 NOTE — Telephone Encounter (Signed)
error 

## 2022-11-04 ENCOUNTER — Telehealth: Payer: Self-pay

## 2022-11-04 ENCOUNTER — Other Ambulatory Visit: Payer: Self-pay | Admitting: Physician Assistant

## 2022-11-04 DIAGNOSIS — Z79899 Other long term (current) drug therapy: Secondary | ICD-10-CM

## 2022-11-04 MED ORDER — OXYCODONE-ACETAMINOPHEN 7.5-325 MG PO TABS
1.0000 | ORAL_TABLET | Freq: Three times a day (TID) | ORAL | 0 refills | Status: AC | PRN
Start: 2022-11-04 — End: ?

## 2022-11-04 NOTE — Telephone Encounter (Signed)
Pt advised that we sent 10 day percocet  7.5-325 due to backorder for 10 mg and lauren sent

## 2022-11-07 NOTE — Telephone Encounter (Signed)
done

## 2022-11-10 ENCOUNTER — Telehealth: Payer: Self-pay

## 2022-11-10 DIAGNOSIS — Z79899 Other long term (current) drug therapy: Secondary | ICD-10-CM

## 2022-11-11 ENCOUNTER — Other Ambulatory Visit: Payer: Self-pay | Admitting: Physician Assistant

## 2022-11-11 DIAGNOSIS — Z79899 Other long term (current) drug therapy: Secondary | ICD-10-CM

## 2022-11-13 MED ORDER — OXYCODONE-ACETAMINOPHEN 7.5-325 MG PO TABS
1.0000 | ORAL_TABLET | Freq: Three times a day (TID) | ORAL | 0 refills | Status: DC | PRN
Start: 2022-11-13 — End: 2022-11-23

## 2022-11-16 MED ORDER — OXYCODONE-ACETAMINOPHEN 7.5-325 MG PO TABS
1.0000 | ORAL_TABLET | Freq: Three times a day (TID) | ORAL | 0 refills | Status: DC | PRN
Start: 2022-11-16 — End: 2022-11-23

## 2022-11-16 NOTE — Telephone Encounter (Signed)
done

## 2022-11-23 ENCOUNTER — Ambulatory Visit (INDEPENDENT_AMBULATORY_CARE_PROVIDER_SITE_OTHER): Payer: PPO | Admitting: Nurse Practitioner

## 2022-11-23 ENCOUNTER — Encounter: Payer: Self-pay | Admitting: Nurse Practitioner

## 2022-11-23 VITALS — BP 110/76 | HR 96 | Temp 98.3°F | Resp 16 | Ht 72.0 in | Wt 214.2 lb

## 2022-11-23 DIAGNOSIS — G8929 Other chronic pain: Secondary | ICD-10-CM

## 2022-11-23 DIAGNOSIS — M5442 Lumbago with sciatica, left side: Secondary | ICD-10-CM

## 2022-11-23 DIAGNOSIS — E291 Testicular hypofunction: Secondary | ICD-10-CM

## 2022-11-23 DIAGNOSIS — Z23 Encounter for immunization: Secondary | ICD-10-CM

## 2022-11-23 DIAGNOSIS — Z79890 Hormone replacement therapy (postmenopausal): Secondary | ICD-10-CM | POA: Diagnosis not present

## 2022-11-23 DIAGNOSIS — M5441 Lumbago with sciatica, right side: Secondary | ICD-10-CM

## 2022-11-23 DIAGNOSIS — R7303 Prediabetes: Secondary | ICD-10-CM | POA: Diagnosis not present

## 2022-11-23 DIAGNOSIS — F411 Generalized anxiety disorder: Secondary | ICD-10-CM

## 2022-11-23 DIAGNOSIS — F331 Major depressive disorder, recurrent, moderate: Secondary | ICD-10-CM | POA: Diagnosis not present

## 2022-11-23 DIAGNOSIS — E782 Mixed hyperlipidemia: Secondary | ICD-10-CM

## 2022-11-23 DIAGNOSIS — Z5181 Encounter for therapeutic drug level monitoring: Secondary | ICD-10-CM | POA: Diagnosis not present

## 2022-11-23 MED ORDER — ALPRAZOLAM 0.5 MG PO TABS: 0.5000 mg | ORAL_TABLET | Freq: Three times a day (TID) | ORAL | 2 refills | Status: DC | PRN

## 2022-11-23 MED ORDER — OXYCODONE-ACETAMINOPHEN 7.5-325 MG PO TABS
1.0000 | ORAL_TABLET | Freq: Three times a day (TID) | ORAL | 0 refills | Status: DC | PRN
Start: 2022-11-23 — End: 2023-01-19

## 2022-11-23 MED ORDER — TIZANIDINE HCL 2 MG PO TABS: ORAL_TABLET | ORAL | 1 refills | Status: DC

## 2022-11-23 MED ORDER — TESTOSTERONE CYPIONATE 200 MG/ML IM SOLN: 100.0000 mg | INTRAMUSCULAR | 0 refills | Status: DC

## 2022-11-23 MED ORDER — OXYCODONE-ACETAMINOPHEN 7.5-325 MG PO TABS
1.0000 | ORAL_TABLET | Freq: Three times a day (TID) | ORAL | 0 refills | Status: DC | PRN
Start: 2023-01-18 — End: 2023-01-19

## 2022-11-23 MED ORDER — OXYCODONE-ACETAMINOPHEN 7.5-325 MG PO TABS
1.0000 | ORAL_TABLET | Freq: Three times a day (TID) | ORAL | 0 refills | Status: DC | PRN
Start: 2022-12-21 — End: 2023-01-19

## 2022-11-23 NOTE — Progress Notes (Signed)
[facility] [address] [place], [place] [address]  Internal MEDICINE  Office Visit Note  Patient Name: Nathan Meyer  [id number]  [id number]  Date of Service: 11/23/2022  Chief Complaint  Patient presents with   Gastroesophageal Reflux   Hypertension   Follow-up    HPI Nathan Meyer presents for a follow-up visit for chronic back pain, anxiety, TRT, and prediabetes.  Chronic back pain -- having issues with the 10 mg percocet not helping. Wants to stay down at the 7.5 mg dose. Due for refills Anxiety -- taking alprazolam, still stable no changes  Testosterone replacement therapy -- feels like it is helping but that his levels are still low  Prediabetes -- discussed diet and lifestyle changes     Current Medication: Outpatient Encounter Medications as of 11/23/2022  Medication Sig   atorvastatin (LIPITOR) 20 MG tablet Take 1 tablet (20 mg total) by mouth at bedtime.   Cholecalciferol (VITAMIN D3 PO) Take by mouth.   Cyanocobalamin (B-12 PO) Take by mouth.   doxycycline (VIBRA-TABS) 100 MG tablet Take 1 tablet (100 mg total) by mouth daily. With food and plenty of fluid   FLUoxetine (PROZAC) 20 MG capsule Take 1 capsule (20 mg total) by mouth daily.   FLUoxetine (PROZAC) 40 MG capsule Take 1 capsule (40 mg total) by mouth daily.   hydrochlorothiazide (MICROZIDE) 12.5 MG capsule Take 1 capsule (12.5 mg total) by mouth every morning.   irbesartan (AVAPRO) 75 MG tablet Take 1 tablet (75 mg total) by mouth daily.   ketoconazole (NIZORAL) 2 % shampoo APPLY 3 TIMES PER WEEK. MASSAGE INTO THESCALP AND LEAVE FOR AT LEAST 5 MINUTES BEFORE RINSING OUT.   lansoprazole (PREVACID) 15 MG capsule Take 1 capsule (15 mg total) by mouth daily.   minocycline (MINOCIN) 100 MG capsule Take 1 capsule (100 mg total) by mouth daily.   mometasone (ELOCON) 0.1 % lotion Apply topically as directed. 3-4 times per week prn itch   Multiple Vitamin (MULTIVITAMIN ADULT PO) Take by mouth.   mupirocin  ointment (BACTROBAN) 2 % Apply 1 Application topically daily. With dressing changes   SYRINGE-NEEDLE, DISP, 3 ML (B-D SYRINGE/NEEDLE 3CC/23GX1") 23G X 1" 3 ML MISC 1 Device by Does not apply route every 14 (fourteen) days. To be used for IM testosterone injection.   traZODone (DESYREL) 100 MG tablet Take 1-2 tablets (100-200 mg total) by mouth at bedtime. Takes (2) 100mg  tabs every night   [DISCONTINUED] ALPRAZolam (XANAX) 0.5 MG tablet Take 1 tablet (0.5 mg total) by mouth 3 (three) times daily as needed for anxiety.   [DISCONTINUED] oxyCODONE-acetaminophen (PERCOCET) 10-325 MG tablet Take 1 tablet by mouth every 8 (eight) hours as needed for pain.   [DISCONTINUED] oxyCODONE-acetaminophen (PERCOCET) 10-325 MG tablet Take 1 tablet by mouth every 8 (eight) hours as needed for pain.   [DISCONTINUED] oxyCODONE-acetaminophen (PERCOCET) 10-325 MG tablet Take 1 tablet by mouth every 8 (eight) hours as needed for pain.   [DISCONTINUED] oxyCODONE-acetaminophen (PERCOCET) 7.5-325 MG tablet Take 1 tablet by mouth every 8 (eight) hours as needed for severe pain.   [DISCONTINUED] oxyCODONE-acetaminophen (PERCOCET) 7.5-325 MG tablet Take 1 tablet by mouth every 8 (eight) hours as needed for severe pain.   [DISCONTINUED] testosterone cypionate (DEPOTESTOSTERONE CYPIONATE) 200 MG/ML injection Inject 0.5 mLs (100 mg total) into the muscle every 28 (twenty-eight) days.   [DISCONTINUED] tiZANidine (ZANAFLEX) 2 MG tablet TAKE 1 TABLET BY MOUTH EVERY 6 HOURS AS NEEDED FOR MUSCLE SPASM **NO MORE THAN 4 TABS IN 24  HOURS**   ALPRAZolam (XANAX) 0.5 MG tablet Take 1 tablet (0.5 mg total) by mouth 3 (three) times daily as needed for anxiety.   oxyCODONE-acetaminophen (PERCOCET) 7.5-325 MG tablet Take 1 tablet by mouth every 8 (eight) hours as needed for severe pain. May take 1 extra tablet once daily as needed for breakthrough pain.   [START ON 12/21/2022] oxyCODONE-acetaminophen (PERCOCET) 7.5-325 MG tablet Take 1 tablet by  mouth every 8 (eight) hours as needed for severe pain. May take 1 extra tablet once daily as needed for breakthrough pain.   [START ON 01/18/2023] oxyCODONE-acetaminophen (PERCOCET) 7.5-325 MG tablet Take 1 tablet by mouth every 8 (eight) hours as needed for severe pain. May take 1 extra tablet once daily as needed for breakthrough pain.   testosterone cypionate (DEPOTESTOSTERONE CYPIONATE) 200 MG/ML injection Inject 0.5 mLs (100 mg total) into the muscle every 28 (twenty-eight) days.   tiZANidine (ZANAFLEX) 2 MG tablet TAKE 1 TABLET BY MOUTH EVERY 6 HOURS AS NEEDED FOR MUSCLE SPASM **NO MORE THAN 4 TABS IN 24 HOURS**   No facility-administered encounter medications on file as of 11/23/2022.    Surgical History: Past Surgical History:  Procedure Laterality Date   ANTERIOR FUSION CERVICAL SPINE  1995   finger debridement      Medical History: Past Medical History:  Diagnosis Date   Arthritis    Cyst, kidney, acquired    GERD (gastroesophageal reflux disease)    Hyperlipidemia    Neuromuscular disorder (HCC)    Spine degeneration    Substance abuse (HCC)     Family History: Family History  Problem Relation Age of Onset   Arthritis Mother    Hypertension Mother    Hyperlipidemia Mother    Anxiety disorder Mother    Depression Father    COPD Father    Alcohol abuse Maternal Uncle    Alcohol abuse Paternal Uncle    Alcohol abuse Paternal Grandfather    Alcohol abuse Cousin     Social History   Socioeconomic History   Marital status: Divorced    Spouse name: Not on file   Number of children: Not on file   Years of education: Not on file   Highest education level: Not on file  Occupational History   Not on file  Tobacco Use   Smoking status: Former    Current packs/day: 0.00    Average packs/day: 1 pack/day for 38.3 years (38.3 ttl pk-yrs)    Types: Cigarettes    Start date: 38    Quit date: 05/29/2021    Years since quitting: 1.4   Smokeless tobacco: Never   Substance and Sexual Activity   Alcohol use: No   Drug use: No    Comment: former substance abuser - cocaine, pills, marijuana   Sexual activity: Not on file  Other Topics Concern   Not on file  Social History Narrative   Not on file   Social Determinants of Health   Financial Resource Strain: Not on file  Food Insecurity: Not on file  Transportation Needs: Not on file  Physical Activity: Not on file  Stress: Not on file  Social Connections: Not on file  Intimate Partner Violence: Not on file      Review of Systems  Constitutional:  Positive for activity change and fatigue. Negative for chills and unexpected weight change.  HENT:  Negative for congestion, rhinorrhea, sneezing and sore throat.   Eyes:  Negative for redness.  Respiratory: Negative.  Negative for cough, chest tightness,  shortness of breath and wheezing.   Cardiovascular: Negative.  Negative for chest pain and palpitations.  Gastrointestinal:  Negative for abdominal pain, constipation, diarrhea, nausea and vomiting.  Genitourinary:  Negative for dysuria and frequency.  Musculoskeletal:  Positive for arthralgias and back pain. Negative for joint swelling and neck pain.  Neurological: Negative.  Negative for tremors and numbness.  Hematological:  Negative for adenopathy. Does not bruise/bleed easily.  Psychiatric/Behavioral:  Positive for behavioral problems (Depression) and decreased concentration. Negative for self-injury, sleep disturbance and suicidal ideas. The patient is nervous/anxious.     Vital Signs: BP 110/76   Pulse 96   Temp 98.3 F (36.8 C)   Resp 16   Ht 6' (1.829 m)   Wt 214 lb 3.2 oz (97.2 kg)   SpO2 94%   BMI 29.05 kg/m    Physical Exam Vitals reviewed.  Constitutional:      General: He is not in acute distress.    Appearance: Normal appearance. He is obese. He is not ill-appearing.  HENT:     Head: Normocephalic and atraumatic.  Eyes:     Pupils: Pupils are equal, round, and  reactive to light.  Cardiovascular:     Rate and Rhythm: Normal rate and regular rhythm.  Pulmonary:     Effort: Pulmonary effort is normal. No respiratory distress.  Neurological:     Mental Status: He is alert and oriented to person, place, and time.  Psychiatric:        Mood and Affect: Mood normal.        Behavior: Behavior normal.        Assessment/Plan: 1. Chronic bilateral low back pain with bilateral sciatica Continue percocet as prescribed and continue tizanidine as prescribed.  - oxyCODONE-acetaminophen (PERCOCET) 7.5-325 MG tablet; Take 1 tablet by mouth every 8 (eight) hours as needed for severe pain. May take 1 extra tablet once daily as needed for breakthrough pain.  Dispense: 105 tablet; Refill: 0 - oxyCODONE-acetaminophen (PERCOCET) 7.5-325 MG tablet; Take 1 tablet by mouth every 8 (eight) hours as needed for severe pain. May take 1 extra tablet once daily as needed for breakthrough pain.  Dispense: 105 tablet; Refill: 0 - oxyCODONE-acetaminophen (PERCOCET) 7.5-325 MG tablet; Take 1 tablet by mouth every 8 (eight) hours as needed for severe pain. May take 1 extra tablet once daily as needed for breakthrough pain.  Dispense: 105 tablet; Refill: 0 - tiZANidine (ZANAFLEX) 2 MG tablet; TAKE 1 TABLET BY MOUTH EVERY 6 HOURS AS NEEDED FOR MUSCLE SPASM **NO MORE THAN 4 TABS IN 24 HOURS**  Dispense: 120 tablet; Refill: 1  2. Prediabetes Routine labs ordered Discussed diet and lifestyle modifications  - Lipid Profile - CMP14+EGFR - CBC with Differential/Platelet - Hgb A1C w/o eAG  3. Mixed hyperlipidemia Routine labs ordered  - Lipid Profile - CMP14+EGFR  4. Hypogonadism in male Continue testosterone injections and routine labs ordered  - testosterone cypionate (DEPOTESTOSTERONE CYPIONATE) 200 MG/ML injection; Inject 0.5 mLs (100 mg total) into the muscle every 28 (twenty-eight) days.  Dispense: 10 mL; Refill: 0 - Testosterone,Free and Total - Lipid Profile -  CMP14+EGFR - CBC with Differential/Platelet  5. Encounter for monitoring testosterone replacement therapy Routine labs ordered  - Testosterone,Free and Total - Lipid Profile - CMP14+EGFR - CBC with Differential/Platelet  6. Needs flu shot Flu vaccine administered in office today  - Influenza, MDCK, trivalent, PF(Flucelvax egg-free)  7. GAD (generalized anxiety disorder) Continue alprazolam as prescribed.  - ALPRAZolam (XANAX) 0.5 MG tablet; Take 1  tablet (0.5 mg total) by mouth 3 (three) times daily as needed for anxiety.  Dispense: 90 tablet; Refill: 2  8. Moderate episode of recurrent major depressive disorder (HCC) Continue fluoxetine as prescribed    General Counseling: Kyros verbalizes understanding of the findings of todays visit and agrees with plan of treatment. I have discussed any further diagnostic evaluation that may be needed or ordered today. We also reviewed his medications today. he has been encouraged to call the office with any questions or concerns that should arise related to todays visit.    Orders Placed This Encounter  Procedures   Influenza, MDCK, trivalent, PF(Flucelvax egg-free)   Testosterone,Free and Total   Lipid Profile   CMP14+EGFR   CBC with Differential/Platelet   Hgb A1C w/o eAG    Meds ordered this encounter  Medications   oxyCODONE-acetaminophen (PERCOCET) 7.5-325 MG tablet    Sig: Take 1 tablet by mouth every 8 (eight) hours as needed for severe pain. May take 1 extra tablet once daily as needed for breakthrough pain.    Dispense:  105 tablet    Refill:  0    For next fill, see change in directions and number of tabs   oxyCODONE-acetaminophen (PERCOCET) 7.5-325 MG tablet    Sig: Take 1 tablet by mouth every 8 (eight) hours as needed for severe pain. May take 1 extra tablet once daily as needed for breakthrough pain.    Dispense:  105 tablet    Refill:  0    For November fill   oxyCODONE-acetaminophen (PERCOCET) 7.5-325 MG tablet     Sig: Take 1 tablet by mouth every 8 (eight) hours as needed for severe pain. May take 1 extra tablet once daily as needed for breakthrough pain.    Dispense:  105 tablet    Refill:  0    For December fill   ALPRAZolam (XANAX) 0.5 MG tablet    Sig: Take 1 tablet (0.5 mg total) by mouth 3 (three) times daily as needed for anxiety.    Dispense:  90 tablet    Refill:  2   testosterone cypionate (DEPOTESTOSTERONE CYPIONATE) 200 MG/ML injection    Sig: Inject 0.5 mLs (100 mg total) into the muscle every 28 (twenty-eight) days.    Dispense:  10 mL    Refill:  0    Fill new script today with syringes   tiZANidine (ZANAFLEX) 2 MG tablet    Sig: TAKE 1 TABLET BY MOUTH EVERY 6 HOURS AS NEEDED FOR MUSCLE SPASM **NO MORE THAN 4 TABS IN 24 HOURS**    Dispense:  120 tablet    Refill:  1    FOR NEXT FILL. THANK YOU.    Return in about 2 months (around 01/23/2023) for F/U, anxiety med refill, pain med refill, Ashika Apuzzo PCP.   Total time spent:30 Minutes Time spent includes review of chart, medications, test results, and follow up plan with the patient.   [place] Controlled Substance Database was reviewed by me.  This patient was seen by Sallyanne Kuster, FNP-C in collaboration with Dr. Beverely Risen as a part of collaborative care agreement.   Naveed Humphres R. Tedd Sias, MSN, FNP-C Internal medicine

## 2022-12-05 ENCOUNTER — Other Ambulatory Visit: Payer: Self-pay | Admitting: Nurse Practitioner

## 2022-12-05 DIAGNOSIS — F32A Depression, unspecified: Secondary | ICD-10-CM

## 2022-12-05 NOTE — Telephone Encounter (Signed)
Please review and send 

## 2022-12-15 DIAGNOSIS — R7303 Prediabetes: Secondary | ICD-10-CM | POA: Diagnosis not present

## 2022-12-15 DIAGNOSIS — Z79890 Hormone replacement therapy (postmenopausal): Secondary | ICD-10-CM | POA: Diagnosis not present

## 2022-12-15 DIAGNOSIS — E782 Mixed hyperlipidemia: Secondary | ICD-10-CM | POA: Diagnosis not present

## 2022-12-15 DIAGNOSIS — E291 Testicular hypofunction: Secondary | ICD-10-CM | POA: Diagnosis not present

## 2022-12-15 DIAGNOSIS — Z5181 Encounter for therapeutic drug level monitoring: Secondary | ICD-10-CM | POA: Diagnosis not present

## 2022-12-19 LAB — CMP14+EGFR
ALT: 24 [IU]/L (ref 0–44)
AST: 22 [IU]/L (ref 0–40)
Albumin: 4.7 g/dL (ref 3.9–4.9)
Alkaline Phosphatase: 80 [IU]/L (ref 44–121)
BUN/Creatinine Ratio: 16 (ref 10–24)
BUN: 18 mg/dL (ref 8–27)
Bilirubin Total: 0.6 mg/dL (ref 0.0–1.2)
CO2: 24 mmol/L (ref 20–29)
Calcium: 9.6 mg/dL (ref 8.6–10.2)
Chloride: 97 mmol/L (ref 96–106)
Creatinine, Ser: 1.14 mg/dL (ref 0.76–1.27)
Globulin, Total: 2.8 g/dL (ref 1.5–4.5)
Glucose: 104 mg/dL — ABNORMAL HIGH (ref 70–99)
Potassium: 4.1 mmol/L (ref 3.5–5.2)
Sodium: 139 mmol/L (ref 134–144)
Total Protein: 7.5 g/dL (ref 6.0–8.5)
eGFR: 72 mL/min/{1.73_m2} (ref >59)

## 2022-12-19 LAB — CBC WITH DIFFERENTIAL/PLATELET
Basophils Absolute: 0.1 10*3/uL (ref 0.0–0.2)
Basos: 1 %
EOS (ABSOLUTE): 0.3 10*3/uL (ref 0.0–0.4)
Eos: 5 %
Hematocrit: 44.5 % (ref 37.5–51.0)
Hemoglobin: 14.8 g/dL (ref 13.0–17.7)
Immature Grans (Abs): 0 10*3/uL (ref 0.0–0.1)
Immature Granulocytes: 0 %
Lymphocytes Absolute: 2.5 10*3/uL (ref 0.7–3.1)
Lymphs: 41 %
MCH: 31.7 pg (ref 26.6–33.0)
MCHC: 33.3 g/dL (ref 31.5–35.7)
MCV: 95 fL (ref 79–97)
Monocytes Absolute: 0.5 10*3/uL (ref 0.1–0.9)
Monocytes: 7 %
Neutrophils Absolute: 2.8 10*3/uL (ref 1.4–7.0)
Neutrophils: 46 %
Platelets: 197 10*3/uL (ref 150–450)
RBC: 4.67 x10E6/uL (ref 4.14–5.80)
RDW: 12.2 % (ref 11.6–15.4)
WBC: 6.1 10*3/uL (ref 3.4–10.8)

## 2022-12-19 LAB — LIPID PANEL
Chol/HDL Ratio: 3.7 {ratio} (ref 0.0–5.0)
Cholesterol, Total: 132 mg/dL (ref 100–199)
HDL: 36 mg/dL — ABNORMAL LOW (ref >39)
LDL Chol Calc (NIH): 75 mg/dL (ref 0–99)
Triglycerides: 118 mg/dL (ref 0–149)
VLDL Cholesterol Cal: 21 mg/dL (ref 5–40)

## 2022-12-19 LAB — TESTOSTERONE,FREE AND TOTAL
Testosterone, Free: 2.5 pg/mL — ABNORMAL LOW (ref 6.6–18.1)
Testosterone: 262 ng/dL — ABNORMAL LOW (ref 264–916)

## 2022-12-19 LAB — HGB A1C W/O EAG: Hgb A1c MFr Bld: 5.6 % (ref 4.8–5.6)

## 2022-12-22 DIAGNOSIS — H2513 Age-related nuclear cataract, bilateral: Secondary | ICD-10-CM | POA: Diagnosis not present

## 2023-01-19 ENCOUNTER — Other Ambulatory Visit: Payer: Self-pay | Admitting: Internal Medicine

## 2023-01-19 ENCOUNTER — Encounter: Payer: Self-pay | Admitting: Nurse Practitioner

## 2023-01-19 ENCOUNTER — Ambulatory Visit (INDEPENDENT_AMBULATORY_CARE_PROVIDER_SITE_OTHER): Payer: PPO | Admitting: Nurse Practitioner

## 2023-01-19 VITALS — BP 106/70 | HR 95 | Temp 98.4°F | Resp 16 | Ht 72.0 in | Wt 211.6 lb

## 2023-01-19 DIAGNOSIS — Z125 Encounter for screening for malignant neoplasm of prostate: Secondary | ICD-10-CM | POA: Diagnosis not present

## 2023-01-19 DIAGNOSIS — E782 Mixed hyperlipidemia: Secondary | ICD-10-CM

## 2023-01-19 DIAGNOSIS — M5441 Lumbago with sciatica, right side: Secondary | ICD-10-CM

## 2023-01-19 DIAGNOSIS — M5442 Lumbago with sciatica, left side: Secondary | ICD-10-CM | POA: Diagnosis not present

## 2023-01-19 DIAGNOSIS — E291 Testicular hypofunction: Secondary | ICD-10-CM

## 2023-01-19 DIAGNOSIS — F411 Generalized anxiety disorder: Secondary | ICD-10-CM

## 2023-01-19 DIAGNOSIS — G8929 Other chronic pain: Secondary | ICD-10-CM | POA: Diagnosis not present

## 2023-01-19 MED ORDER — ATORVASTATIN CALCIUM 20 MG PO TABS: 20.0000 mg | ORAL_TABLET | Freq: Every day | ORAL | 3 refills | Status: DC

## 2023-01-19 MED ORDER — OXYCODONE-ACETAMINOPHEN 7.5-325 MG PO TABS: 1.0000 | ORAL_TABLET | Freq: Three times a day (TID) | ORAL | 0 refills | Status: DC | PRN

## 2023-01-19 MED ORDER — ALPRAZOLAM 0.5 MG PO TABS: 0.5000 mg | ORAL_TABLET | Freq: Three times a day (TID) | ORAL | 2 refills | Status: DC | PRN

## 2023-01-19 MED ORDER — TESTOSTERONE CYPIONATE 200 MG/ML IM SOLN
INTRAMUSCULAR | 0 refills | Status: DC
Start: 2023-02-02 — End: 2023-04-17

## 2023-01-19 MED ORDER — BD SYRINGE/NEEDLE 23G X 1" 3 ML MISC: 0 refills | Status: AC

## 2023-01-19 NOTE — Progress Notes (Signed)
Spoke with [facility], gave an order to discontinue his refills for oxycodone and alprazolam

## 2023-01-19 NOTE — Progress Notes (Signed)
[facility] [address] [place], [place] [address]  Internal MEDICINE  Office Visit Note  Patient Name: Nathan Meyer  [id number]  [id number]  Date of Service: 01/19/2023  Chief Complaint  Patient presents with   Gastroesophageal Reflux   Hyperlipidemia   Follow-up    HPI Dmonte presents for a follow-up visit for chronic pain, low testosterone, prediabetes, and anxiety.  Low testosterone -- still low on labs, has been taking testosterone injection every 4 weeks.  Prediabetes -- A1c returned to normal range CMP and CBC are normal Cholesterol levels are improving.  Anxiety -- stable, on alprazolam and fluoxetine    Current Medication: Outpatient Encounter Medications as of 01/19/2023  Medication Sig   Cholecalciferol (VITAMIN D3 PO) Take by mouth.   Cyanocobalamin (B-12 PO) Take by mouth.   doxycycline (VIBRA-TABS) 100 MG tablet Take 1 tablet (100 mg total) by mouth daily. With food and plenty of fluid   FLUoxetine (PROZAC) 20 MG capsule TAKE 1 CAPSULE BY MOUTH ONCE DAILY ALONGWITH THE 40MG  CAPSULE FOR A TOTAL DAILY DOSE OF 60MG    FLUoxetine (PROZAC) 40 MG capsule TAKE 1 CAPSULE BY MOUTH ONCE DAILY AS DIRECTED.   hydrochlorothiazide (MICROZIDE) 12.5 MG capsule Take 1 capsule (12.5 mg total) by mouth every morning.   irbesartan (AVAPRO) 75 MG tablet Take 1 tablet (75 mg total) by mouth daily.   ketoconazole (NIZORAL) 2 % shampoo APPLY 3 TIMES PER WEEK. MASSAGE INTO THESCALP AND LEAVE FOR AT LEAST 5 MINUTES BEFORE RINSING OUT.   lansoprazole (PREVACID) 15 MG capsule Take 1 capsule (15 mg total) by mouth daily.   minocycline (MINOCIN) 100 MG capsule Take 1 capsule (100 mg total) by mouth daily.   mometasone (ELOCON) 0.1 % lotion Apply topically as directed. 3-4 times per week prn itch   Multiple Vitamin (MULTIVITAMIN ADULT PO) Take by mouth.   mupirocin ointment (BACTROBAN) 2 % Apply 1 Application topically daily. With dressing changes   oxyCODONE-acetaminophen  (PERCOCET) 7.5-325 MG tablet Take 1 tablet by mouth every 8 (eight) hours as needed for severe pain. May take 1 extra tablet once daily as needed for breakthrough pain.   tiZANidine (ZANAFLEX) 2 MG tablet TAKE 1 TABLET BY MOUTH EVERY 6 HOURS AS NEEDED FOR MUSCLE SPASM **NO MORE THAN 4 TABS IN 24 HOURS**   traZODone (DESYREL) 100 MG tablet Take 1-2 tablets (100-200 mg total) by mouth at bedtime. Takes (2) 100mg  tabs every night   [DISCONTINUED] ALPRAZolam (XANAX) 0.5 MG tablet Take 1 tablet (0.5 mg total) by mouth 3 (three) times daily as needed for anxiety.   [DISCONTINUED] atorvastatin (LIPITOR) 20 MG tablet Take 1 tablet (20 mg total) by mouth at bedtime.   [DISCONTINUED] oxyCODONE-acetaminophen (PERCOCET) 7.5-325 MG tablet Take 1 tablet by mouth every 8 (eight) hours as needed for severe pain. May take 1 extra tablet once daily as needed for breakthrough pain.   [DISCONTINUED] oxyCODONE-acetaminophen (PERCOCET) 7.5-325 MG tablet Take 1 tablet by mouth every 8 (eight) hours as needed for severe pain. May take 1 extra tablet once daily as needed for breakthrough pain.   [DISCONTINUED] SYRINGE-NEEDLE, DISP, 3 ML (B-D SYRINGE/NEEDLE 3CC/23GX1") 23G X 1" 3 ML MISC 1 Device by Does not apply route every 14 (fourteen) days. To be used for IM testosterone injection.   [DISCONTINUED] testosterone cypionate (DEPOTESTOSTERONE CYPIONATE) 200 MG/ML injection Inject 0.5 mLs (100 mg total) into the muscle every 28 (twenty-eight) days.   ALPRAZolam (XANAX) 0.5 MG tablet Take 1 tablet (0.5 mg total) by  mouth 3 (three) times daily as needed for anxiety.   atorvastatin (LIPITOR) 20 MG tablet Take 1 tablet (20 mg total) by mouth at bedtime.   [START ON 03/15/2023] oxyCODONE-acetaminophen (PERCOCET) 7.5-325 MG tablet Take 1 tablet by mouth every 8 (eight) hours as needed for severe pain (pain score 7-10). May take 1 extra tablet once daily as needed for breakthrough pain.   [START ON 02/15/2023] oxyCODONE-acetaminophen  (PERCOCET) 7.5-325 MG tablet Take 1 tablet by mouth every 8 (eight) hours as needed for severe pain (pain score 7-10). May take 1 extra tablet once daily as needed for breakthrough pain.   SYRINGE-NEEDLE, DISP, 3 ML (B-D SYRINGE/NEEDLE 3CC/23GX1") 23G X 1" 3 ML MISC Use 1 syringe with needle every 21 days; To be used for IM testosterone injection.   [START ON 02/02/2023] testosterone cypionate (DEPOTESTOSTERONE CYPIONATE) 200 MG/ML injection Inject 0.5 mLs (100 mg total) into the muscle every 21 days.   No facility-administered encounter medications on file as of 01/19/2023.    Surgical History: Past Surgical History:  Procedure Laterality Date   ANTERIOR FUSION CERVICAL SPINE  1995   finger debridement      Medical History: Past Medical History:  Diagnosis Date   Arthritis    Cyst, kidney, acquired    GERD (gastroesophageal reflux disease)    Hyperlipidemia    Neuromuscular disorder (HCC)    Spine degeneration    Substance abuse (HCC)     Family History: Family History  Problem Relation Age of Onset   Arthritis Mother    Hypertension Mother    Hyperlipidemia Mother    Anxiety disorder Mother    Depression Father    COPD Father    Alcohol abuse Maternal Uncle    Alcohol abuse Paternal Uncle    Alcohol abuse Paternal Grandfather    Alcohol abuse Cousin     Social History   Socioeconomic History   Marital status: Divorced    Spouse name: Not on file   Number of children: Not on file   Years of education: Not on file   Highest education level: Not on file  Occupational History   Not on file  Tobacco Use   Smoking status: Former    Current packs/day: 0.00    Average packs/day: 1 pack/day for 38.3 years (38.3 ttl pk-yrs)    Types: Cigarettes    Start date: 65    Quit date: 05/29/2021    Years since quitting: 1.6   Smokeless tobacco: Never  Substance and Sexual Activity   Alcohol use: No   Drug use: No    Comment: former substance abuser - cocaine, pills,  marijuana   Sexual activity: Not on file  Other Topics Concern   Not on file  Social History Narrative   Not on file   Social Determinants of Health   Financial Resource Strain: Not on file  Food Insecurity: Not on file  Transportation Needs: Not on file  Physical Activity: Not on file  Stress: Not on file  Social Connections: Not on file  Intimate Partner Violence: Not on file      Review of Systems  Constitutional:  Positive for activity change and fatigue. Negative for chills and unexpected weight change.  HENT:  Negative for congestion, rhinorrhea, sneezing and sore throat.   Eyes:  Negative for redness.  Respiratory: Negative.  Negative for cough, chest tightness, shortness of breath and wheezing.   Cardiovascular: Negative.  Negative for chest pain and palpitations.  Gastrointestinal:  Negative for  abdominal pain, constipation, diarrhea, nausea and vomiting.  Genitourinary:  Negative for dysuria and frequency.  Musculoskeletal:  Positive for arthralgias and back pain. Negative for joint swelling and neck pain.  Neurological: Negative.  Negative for tremors and numbness.  Hematological:  Negative for adenopathy. Does not bruise/bleed easily.  Psychiatric/Behavioral:  Positive for behavioral problems (Depression) and decreased concentration. Negative for self-injury, sleep disturbance and suicidal ideas. The patient is nervous/anxious.     Vital Signs: BP 106/70   Pulse 95   Temp 98.4 F (36.9 C)   Resp 16   Ht 6' (1.829 m)   Wt 211 lb 9.6 oz (96 kg)   SpO2 95%   BMI 28.70 kg/m    Physical Exam Vitals reviewed.  Constitutional:      General: He is not in acute distress.    Appearance: Normal appearance. He is obese. He is not ill-appearing.  HENT:     Head: Normocephalic and atraumatic.  Eyes:     Pupils: Pupils are equal, round, and reactive to light.  Cardiovascular:     Rate and Rhythm: Normal rate and regular rhythm.  Pulmonary:     Effort: Pulmonary  effort is normal. No respiratory distress.  Neurological:     Mental Status: He is alert and oriented to person, place, and time.  Psychiatric:        Mood and Affect: Mood normal.        Behavior: Behavior normal.        Assessment/Plan: 1. Chronic bilateral low back pain with bilateral sciatica Continue percocet as prescribed, follow up in 3 months for additional refills. UDS due at next office visit.  - oxyCODONE-acetaminophen (PERCOCET) 7.5-325 MG tablet; Take 1 tablet by mouth every 8 (eight) hours as needed for severe pain (pain score 7-10). May take 1 extra tablet once daily as needed for breakthrough pain.  Dispense: 105 tablet; Refill: 0 - oxyCODONE-acetaminophen (PERCOCET) 7.5-325 MG tablet; Take 1 tablet by mouth every 8 (eight) hours as needed for severe pain (pain score 7-10). May take 1 extra tablet once daily as needed for breakthrough pain.  Dispense: 105 tablet; Refill: 0  2. Hypogonadism in male Increase frequency of testosterone injection to every 3 weeks. Repeat PSA level.  - testosterone cypionate (DEPOTESTOSTERONE CYPIONATE) 200 MG/ML injection; Inject 0.5 mLs (100 mg total) into the muscle every 21 days.  Dispense: 10 mL; Refill: 0 - SYRINGE-NEEDLE, DISP, 3 ML (B-D SYRINGE/NEEDLE 3CC/23GX1") 23G X 1" 3 ML MISC; Use 1 syringe with needle every 21 days; To be used for IM testosterone injection.  Dispense: 50 each; Refill: 0  3. Mixed hyperlipidemia Continue atorvastatin as prescribed.  - atorvastatin (LIPITOR) 20 MG tablet; Take 1 tablet (20 mg total) by mouth at bedtime.  Dispense: 90 tablet; Refill: 3  4. Screening for prostate cancer Repeat PSA level - PSA Total (Reflex To Free)  5. GAD (generalized anxiety disorder) Continue alprazolam as needed as prescribed. Follow up in 3 months for additional refills. UDS due at next office visit.  - ALPRAZolam (XANAX) 0.5 MG tablet; Take 1 tablet (0.5 mg total) by mouth 3 (three) times daily as needed for anxiety.   Dispense: 90 tablet; Refill: 2   General Counseling: Humza verbalizes understanding of the findings of todays visit and agrees with plan of treatment. I have discussed any further diagnostic evaluation that may be needed or ordered today. We also reviewed his medications today. he has been encouraged to call the office with any questions or  concerns that should arise related to todays visit.    Orders Placed This Encounter  Procedures   PSA Total (Reflex To Free)    Meds ordered this encounter  Medications   testosterone cypionate (DEPOTESTOSTERONE CYPIONATE) 200 MG/ML injection    Sig: Inject 0.5 mLs (100 mg total) into the muscle every 21 days.    Dispense:  10 mL    Refill:  0    Fill script on start date with syringes   oxyCODONE-acetaminophen (PERCOCET) 7.5-325 MG tablet    Sig: Take 1 tablet by mouth every 8 (eight) hours as needed for severe pain (pain score 7-10). May take 1 extra tablet once daily as needed for breakthrough pain.    Dispense:  105 tablet    Refill:  0    Fill for January 29   oxyCODONE-acetaminophen (PERCOCET) 7.5-325 MG tablet    Sig: Take 1 tablet by mouth every 8 (eight) hours as needed for severe pain (pain score 7-10). May take 1 extra tablet once daily as needed for breakthrough pain.    Dispense:  105 tablet    Refill:  0    Fill for January 1   ALPRAZolam (XANAX) 0.5 MG tablet    Sig: Take 1 tablet (0.5 mg total) by mouth 3 (three) times daily as needed for anxiety.    Dispense:  90 tablet    Refill:  2   SYRINGE-NEEDLE, DISP, 3 ML (B-D SYRINGE/NEEDLE 3CC/23GX1") 23G X 1" 3 ML MISC    Sig: Use 1 syringe with needle every 21 days; To be used for IM testosterone injection.    Dispense:  50 each    Refill:  0    Please send 1 box, no refills for now with first fill of testosterone cypionate   atorvastatin (LIPITOR) 20 MG tablet    Sig: Take 1 tablet (20 mg total) by mouth at bedtime.    Dispense:  90 tablet    Refill:  3    Return in about 3  months (around 04/12/2023) for F/U, pain med refill, anxiety med refill, Seith Aikey PCP percocet and alprazolam.   Total time spent:30 Minutes Time spent includes review of chart, medications, test results, and follow up plan with the patient.   [place] Controlled Substance Database was reviewed by me.  This patient was seen by Sallyanne Kuster, FNP-C in collaboration with Dr. Beverely Risen as a part of collaborative care agreement.   Estelene Carmack R. Tedd Sias, MSN, FNP-C Internal medicine

## 2023-01-22 ENCOUNTER — Telehealth: Payer: Self-pay | Admitting: Nurse Practitioner

## 2023-01-22 DIAGNOSIS — G8929 Other chronic pain: Secondary | ICD-10-CM

## 2023-01-22 DIAGNOSIS — Z658 Other specified problems related to psychosocial circumstances: Secondary | ICD-10-CM

## 2023-01-22 DIAGNOSIS — F411 Generalized anxiety disorder: Secondary | ICD-10-CM

## 2023-01-22 DIAGNOSIS — F331 Major depressive disorder, recurrent, moderate: Secondary | ICD-10-CM

## 2023-01-22 DIAGNOSIS — M47816 Spondylosis without myelopathy or radiculopathy, lumbar region: Secondary | ICD-10-CM

## 2023-01-22 NOTE — Telephone Encounter (Signed)
Called patient and discussed with the patient the need for referral for pain management and psychiatry for the patient to be able to continue to get treatment for his anxiety and his chronic pain. He has been on high doses over many years of opiates and benzodiazepines. He was on much higher doses prior to establishing primary care at [facility]. He was initially weaned down to lower doses and/or frequencies but over the past several months, his pain level has been worsening and his pain medication dose was increased.  We are now at a level that he does need to see pain management to continue pain control appropriately and safely.  It is also important for the patient to get a full evaluation from a psychiatrist to ensure that he is receiving the most appropriate and optimal treatment to help control his anxiety and depression.

## 2023-01-23 DIAGNOSIS — Z125 Encounter for screening for malignant neoplasm of prostate: Secondary | ICD-10-CM | POA: Diagnosis not present

## 2023-01-24 ENCOUNTER — Telehealth: Payer: Self-pay | Admitting: Nurse Practitioner

## 2023-01-24 LAB — PSA TOTAL (REFLEX TO FREE): Prostate Specific Ag, Serum: 0.5 ng/mL (ref 0.0–4.0)

## 2023-01-24 NOTE — Telephone Encounter (Signed)
Urgent Pain Mgm referral sent via [facility] to [facility].  Notified patient. Gave telephone # [phone]

## 2023-02-01 ENCOUNTER — Telehealth: Payer: Self-pay | Admitting: Nurse Practitioner

## 2023-02-01 NOTE — Telephone Encounter (Signed)
Per Marquis Buggy, referral has been declined. They do not offer medication management. Redirected referral to [facility] via Epic. Notified patient-Nathan Meyer

## 2023-02-02 NOTE — Progress Notes (Signed)
PSA level is normal.

## 2023-02-03 ENCOUNTER — Other Ambulatory Visit: Payer: Self-pay | Admitting: Nurse Practitioner

## 2023-02-03 ENCOUNTER — Encounter: Payer: Self-pay | Admitting: Nurse Practitioner

## 2023-02-03 DIAGNOSIS — G8929 Other chronic pain: Secondary | ICD-10-CM

## 2023-02-03 DIAGNOSIS — F411 Generalized anxiety disorder: Secondary | ICD-10-CM

## 2023-02-03 MED ORDER — OXYCODONE-ACETAMINOPHEN 7.5-325 MG PO TABS
1.0000 | ORAL_TABLET | Freq: Three times a day (TID) | ORAL | 0 refills | Status: DC | PRN
Start: 2023-02-03 — End: 2023-04-17

## 2023-02-03 MED ORDER — ALPRAZOLAM 0.5 MG PO TABS
0.5000 mg | ORAL_TABLET | Freq: Three times a day (TID) | ORAL | 0 refills | Status: DC | PRN
Start: 2023-02-03 — End: 2023-03-03

## 2023-03-03 ENCOUNTER — Other Ambulatory Visit: Payer: Self-pay | Admitting: Nurse Practitioner

## 2023-03-03 DIAGNOSIS — F411 Generalized anxiety disorder: Secondary | ICD-10-CM

## 2023-03-03 NOTE — Telephone Encounter (Signed)
Next 04/17/2023

## 2023-03-17 ENCOUNTER — Other Ambulatory Visit: Payer: Self-pay | Admitting: Nurse Practitioner

## 2023-03-17 DIAGNOSIS — K219 Gastro-esophageal reflux disease without esophagitis: Secondary | ICD-10-CM

## 2023-03-20 ENCOUNTER — Telehealth: Payer: Self-pay

## 2023-03-20 ENCOUNTER — Ambulatory Visit: Payer: PPO | Admitting: Psychiatry

## 2023-03-20 ENCOUNTER — Encounter: Payer: Self-pay | Admitting: Psychiatry

## 2023-03-20 VITALS — BP 124/78 | HR 97 | Temp 98.7°F | Ht 72.0 in | Wt 216.1 lb

## 2023-03-20 DIAGNOSIS — G4700 Insomnia, unspecified: Secondary | ICD-10-CM | POA: Diagnosis not present

## 2023-03-20 DIAGNOSIS — F411 Generalized anxiety disorder: Secondary | ICD-10-CM | POA: Diagnosis not present

## 2023-03-20 DIAGNOSIS — F331 Major depressive disorder, recurrent, moderate: Secondary | ICD-10-CM | POA: Diagnosis not present

## 2023-03-20 MED ORDER — DULOXETINE HCL 30 MG PO CPEP: 30.0000 mg | ORAL_CAPSULE | Freq: Every day | ORAL | 1 refills | Status: DC

## 2023-03-20 NOTE — Progress Notes (Unsigned)
Psychiatric Initial Adult Assessment   Patient Identification: Nathan Meyer MRN:  [id number] Date of Evaluation:  03/20/2023 Referral Source: Sallyanne Kuster NP Chief Complaint:   Chief Complaint  Patient presents with   Establish Care   Depression   Anxiety   Medication Refill   Visit Diagnosis:    ICD-10-CM   1. MDD (major depressive disorder), recurrent episode, moderate (HCC)  F33.1 DULoxetine (CYMBALTA) 30 MG capsule    2. GAD (generalized anxiety disorder)  F41.1 DULoxetine (CYMBALTA) 30 MG capsule    3. Insomnia, unspecified type  G47.00       History of Present Illness:  Nathan Meyer is a 63 year old Caucasian male, lives with girlfriend, in [place], has a history of chronic pain, depression, anxiety was evaluated in office today, presented to establish care.  The patient was referred by primary care for management of depression and anxiety.  Depression and anxiety have been worsening over the past two years, significantly impacting daily activities. He feels unable to engage in daily tasks, expressing feelings of uselessness and a desire to sleep constantly. Difficulty sleeping is exacerbated by his girlfriend's snoring and poor sleep habits.  Denies current suicidal thoughts.  Patient does report persistent low energy which affects his ability to feel motivated to do activities.  He has been on Prozac for approximately eight years, with a recent increase from 40 mg to 60 mg about six to eight months ago, which did not improve symptoms. He ran out of the 20 mg dose two weeks ago and is currently taking only the 40 mg dose. A history of taking various antidepressants, including Zoloft, is noted, but he cannot recall all the names.  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  He does not know if the current dosage of Prozac is beneficial for his anxiety symptoms.  Chronic pain  due to spinal stenosis  is managed with oxycodone. He reports overtaking his medication due to pain, taking four pills instead of the prescribed three per day. He has been turned down by pain clinics and expresses confusion about where to seek pain management. Previous treatments include steroid injections, which caused thrush and mood changes, and muscle relaxants, specifically tizanidine 2 mg every six hours.  Patient currently denies any suicidality, homicidality or perceptual disturbances.  Patient denies any hypomanic or manic symptoms.  Denies any OCD symptoms.   Associated Signs/Symptoms: Depression Symptoms:  depressed mood, anhedonia, fatigue, difficulty concentrating, anxiety, (Hypo) Manic Symptoms:  Irritable Mood, Anxiety Symptoms:  Excessive Worry, Psychotic Symptoms:   Denies PTSD Symptoms: Negative  Past Psychiatric History: Patient reports he was admitted to behavioral [facility] in [place] more than 35 years ago.  Denies suicidal ideation or attempts in the past.  His medications were being managed by primary care provider for depression and anxiety.  Previous Psychotropic Medications: Yes Zoloft, Fluoxetine.  Substance Abuse History in the last 12 months:  No. A history of substance use includes marijuana, cocaine, and Valium, with rehabilitation in 1986.   Consequences of Substance Abuse: Negative  Past Medical History:  Past Medical History:  Diagnosis Date   Anxiety    Arthritis    Cyst, kidney, acquired    Depression    GERD (gastroesophageal reflux disease)    Hyperlipidemia    Neuromuscular disorder (HCC)    Spine degeneration    Substance abuse (HCC)     Past Surgical History:  Procedure  Laterality Date   ANTERIOR FUSION CERVICAL SPINE  1995   finger debridement      Family Psychiatric History: As noted below.  Family History:  Family History  Problem Relation Age of Onset   Arthritis Mother    Hypertension Mother     Hyperlipidemia Mother    Anxiety disorder Mother    Depression Father    COPD Father    Alcohol abuse Maternal Uncle    Alcohol abuse Paternal Uncle    Alcohol abuse Paternal Grandfather    Alcohol abuse Cousin     Social History:   Social History   Socioeconomic History   Marital status: Divorced    Spouse name: Not on file   Number of children: 0   Years of education: Not on file   Highest education level: High school graduate  Occupational History   Not on file  Tobacco Use   Smoking status: Former    Current packs/day: 0.00    Average packs/day: 1 pack/day for 38.3 years (38.3 ttl pk-yrs)    Types: Cigarettes    Start date: 84    Quit date: 05/29/2021    Years since quitting: 1.8   Smokeless tobacco: Never  Vaping Use   Vaping status: Never Used  Substance and Sexual Activity   Alcohol use: No   Drug use: Not Currently    Comment: former substance abuser - cocaine, pills, marijuana   Sexual activity: Not Currently  Other Topics Concern   Not on file  Social History Narrative   Not on file   Social Drivers of Health   Financial Resource Strain: Not on file  Food Insecurity: Not on file  Transportation Needs: Not on file  Physical Activity: Not on file  Stress: Not on file  Social Connections: Not on file    Additional Social History: Patient reports he was raised by both parents.  Patient has 1 sister.  Patient reports an okay relationship.  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  He is currently on disability.  Patient was married x 2, divorced x 2.  Currently lives with  girlfriend in [place] and reports relationship as going okay.  He denies having children.  He currently lives in [place].  Denies any pending legal problems.  Patient is religious.  He denies access to gun.  Patient reports his girlfriend does have a gun however it safely locked away.  Allergies:   Allergies  Allergen Reactions    Nsaids Nausea And Vomiting and Anaphylaxis    Only in very high doses   Prednisone Anaphylaxis   Tolmetin Nausea And Vomiting    Only in very high doses    Metabolic Disorder Labs: Lab Results  Component Value Date   HGBA1C 5.6 12/15/2022   No results found for: "PROLACTIN" Lab Results  Component Value Date   CHOL 132 12/15/2022   TRIG 118 12/15/2022   HDL 36 (L) 12/15/2022   CHOLHDL 3.7 12/15/2022   LDLCALC 75 12/15/2022   LDLCALC 91 06/16/2022   Lab Results  Component Value Date   TSH 1.860 05/17/2021    Therapeutic Level Labs: No results found for: "LITHIUM" No results found for: "CBMZ" No results found for: "VALPROATE"  Current Medications: Current Outpatient Medications  Medication Sig Dispense Refill   ALPRAZolam (XANAX) 0.5 MG tablet TAKE 1 TABLET BY MOUTH 3 TIMES DAILY AS NEEDED FOR ANXIETY. 90 tablet 0   atorvastatin (LIPITOR) 20 MG  tablet Take 1 tablet (20 mg total) by mouth at bedtime. 90 tablet 3   Cholecalciferol (VITAMIN D3 PO) Take by mouth.     Cyanocobalamin (B-12 PO) Take by mouth.     DULoxetine (CYMBALTA) 30 MG capsule Take 1 capsule (30 mg total) by mouth daily with supper. 30 capsule 1   FLUoxetine (PROZAC) 40 MG capsule TAKE 1 CAPSULE BY MOUTH ONCE DAILY AS DIRECTED. 90 capsule 3   hydrochlorothiazide (MICROZIDE) 12.5 MG capsule Take 1 capsule (12.5 mg total) by mouth every morning. 90 capsule 3   irbesartan (AVAPRO) 75 MG tablet Take 1 tablet (75 mg total) by mouth daily. 90 tablet 3   Magnesium Gluconate 550 MG TABS Take by mouth.     minocycline (MINOCIN) 100 MG capsule Take 1 capsule (100 mg total) by mouth daily. 30 capsule 0   Multiple Vitamin (MULTIVITAMIN ADULT PO) Take by mouth.     oxyCODONE-acetaminophen (PERCOCET) 7.5-325 MG tablet Take 1 tablet by mouth every 8 (eight) hours as needed for severe pain (pain score 7-10). May take 1 extra tablet once daily as needed for breakthrough pain. 90 tablet 0   SYRINGE-NEEDLE, DISP, 3 ML (B-D  SYRINGE/NEEDLE 3CC/23GX1") 23G X 1" 3 ML MISC Use 1 syringe with needle every 21 days; To be used for IM testosterone injection. 50 each 0   testosterone cypionate (DEPOTESTOSTERONE CYPIONATE) 200 MG/ML injection Inject 0.5 mLs (100 mg total) into the muscle every 21 days. 10 mL 0   tiZANidine (ZANAFLEX) 2 MG tablet TAKE 1 TABLET BY MOUTH EVERY 6 HOURS AS NEEDED FOR MUSCLE SPASM **NO MORE THAN 4 TABS IN 24 HOURS** 120 tablet 1   traZODone (DESYREL) 100 MG tablet Take 1-2 tablets (100-200 mg total) by mouth at bedtime. Takes (2) 100mg  tabs every night 180 tablet 3   valsartan (DIOVAN) 80 MG tablet Take by mouth.     lansoprazole (PREVACID) 15 MG capsule TAKE 1 CAPSULE BY MOUTH ONCE DAILY 90 capsule 3   No current facility-administered medications for this visit.    Musculoskeletal: Strength & Muscle Tone: within normal limits Gait & Station: normal Patient leans: N/A  Psychiatric Specialty Exam: Review of Systems  Musculoskeletal:  Positive for back pain.  Psychiatric/Behavioral:  Positive for decreased concentration, dysphoric mood and sleep disturbance. The patient is nervous/anxious.     Blood pressure 124/78, pulse 97, temperature 98.7 F (37.1 C), temperature source Temporal, height 6' (1.829 m), weight 216 lb 1.9 oz (98 kg), SpO2 98%.Body mass index is 29.31 kg/m.  General Appearance: Fairly Groomed  Eye Contact:  Fair  Speech:  Clear and Coherent  Volume:  Normal  Mood:  Anxious and Depressed  Affect:  Congruent  Thought Process:  Goal Directed and Descriptions of Associations: Intact  Orientation:  Full (Time, Place, and Person)  Thought Content:  Logical  Suicidal Thoughts:  No  Homicidal Thoughts:  No  Memory:  Immediate;   Fair Recent;   Fair Remote;   Limited  Judgement:  Fair  Insight:  Fair  Psychomotor Activity:  Normal  Concentration:  Concentration: Fair and Attention Span: Fair  Recall:  [employer] of Knowledge:Fair  Language: Fair  Akathisia:  No   Handed:  Right  AIMS (if indicated):  not done  Assets:  Desire for Improvement Housing Social Support  ADL's:  Intact  Cognition: WNL  Sleep:  Poor   Screenings: Engineer, building services Visit from 06/14/2022 in [facility], [facility] Office Visit  from 06/09/2021 in [facility], [facility]  Total Score (max 30 points ) 30 30      PHQ2-9    Flowsheet Row Office Visit from 03/07/2022 in [facility], [facility] Office Visit from 11[facility]08/2021 in [facility], [facility] Office Visit from 08/11/2021 in [facility], [facility] Office Visit from 05/05/2021 in [facility], [facility]  PHQ-2 Total Score 6 6 0 6  PHQ-9 Total Score 18 16 -- 15       Assessment and Plan: BARAN KUHRT is a 63 year old Caucasian male with depression, anxiety, chronic pain, presents for management of mood symptoms, discussed assessment and plan as noted below. The patient demonstrates the following risk factors for suicide: Chronic risk factors for suicide include: psychiatric disorder of depression, anxiety and chronic pain. Acute risk factors for suicide include:  Significant pain affecting ability to function affecting mood . Protective factors for this patient include: positive social support, positive therapeutic relationship, and religious beliefs against suicide. Considering these factors, the overall suicide risk at this point appears to be low. Patient is appropriate for outpatient follow up.  Major Depressive Disorder-unstable Chronic depression with symptoms persisting for over two years. Current treatment with fluoxetine 40 mg is no longer effective. Reports increased depressive symptoms, lack of motivation, and significant functional impairment. No active suicidal ideation, but passive thoughts of death due to pain and life stressors. History of multiple antidepressant trials. Discussed starting duloxetine 30 mg daily in the evening, with potential side  effects including hepatotoxicity, hypertension, gastrointestinal issues, and sexual dysfunction. Plan to taper fluoxetine and increase duloxetine dosage in 3-4 weeks to avoid withdrawal symptoms.   - Start Duloxetine 30 mg daily in the evening.   - Continue fluoxetine 40 mg daily for now.   - Taper Fluoxetine and increase Duloxetine dosage in 3-4 weeks.   - Refer to therapist for cognitive behavioral therapy and anxiety management.    Generalized Anxiety Disorder-unstable Chronic anxiety with frequent panic attacks. Currently managed with Xanax 0.5 mg three times daily, which is not ideal due to potential for dependency and interaction with oxycodone. Reports significant anxiety and panic symptoms exacerbated by life stressors and chronic pain. Discussed risks of long-term Xanax use, including addiction, increased risk of dementia, and falls. Emphasized need for tapering off Xanax under supervision and exploring alternative anxiety management strategies.   - Taper off Xanax under supervision of primary care provider.   - Refer to therapist for anxiety management and coping strategies.    Chronic Pain Syndrome   Chronic pain secondary to spinal stenosis at L5. History of multiple treatments including steroid injections and oxycodone. Reports overtaking oxycodone due to severe pain. Pain management complicated by being turned down by pain clinics. Discussed need for a comprehensive pain management plan and potential benefits of aquatic therapy.   - Contact primary care provider to arrange referral to a new pain management specialist.   - Discuss potential for aquatic therapy with primary care provider.   - Discuss with primary care provider the need for a comprehensive pain management plan.   - Adhere strictly to prescribed oxycodone dosage.    Insomnia-unstable Chronic insomnia exacerbated by pain and anxiety. Currently managed with trazodone, but reports frequent awakenings and poor sleep quality.  Discussed need to address underlying anxiety and pain to improve sleep quality.   - Continue Trazodone 100-200 mg at bedtime for sleep.   - Address underlying  pain to improve sleep quality.    I have reviewed  labs including CMP-12/15/2022-glucose at 104 slightly elevated otherwise within normal limits, CBC with differential within normal limits  Collaboration of Care: Referral or follow-up with counselor/therapist AEB patient to establish care with therapist, have communicated with staff.  Patient/Guardian was advised Release of Information must be obtained prior to any record release in order to collaborate their care with an outside provider. Patient/Guardian was advised if they have not already done so to contact the registration department to sign all necessary forms in order for [place] to release information regarding their care.   Consent: Patient/Guardian gives verbal consent for treatment and assignment of benefits for services provided during this visit. Patient/Guardian expressed understanding and agreed to proceed.    Follow-up in clinic in 3 to 4 weeks or sooner if needed.  This note was generated in part or whole with voice recognition software. Voice recognition is usually quite accurate but there are transcription errors that can and very often do occur. I apologize for any typographical errors that were not detected and corrected.    Jomarie Longs, MD 2/3/20255:55 PM

## 2023-03-20 NOTE — Patient Instructions (Signed)
Duloxetine Delayed-Release Capsules What is this medication? DULOXETINE (doo LOX e teen) treats depression, anxiety, fibromyalgia, and certain types of chronic pain such as nerve, bone, or joint pain. It increases the amount of serotonin and norepinephrine in the brain, hormones that help regulate mood and pain. It belongs to a group of medications called SNRIs. This medicine may be used for other purposes; ask your health care provider or pharmacist if you have questions. COMMON BRAND NAME(S): Cymbalta, Drizalma, Irenka What should I tell my care team before I take this medication? They need to know if you have any of these conditions: Bipolar disorder Glaucoma High blood pressure Kidney disease Liver disease Seizures Suicidal thoughts, plans, or attempt by you or a family member Take medications that treat or prevent blood clots Taken an MAOI, such as Carbex, Eldepryl, Marplan, Nardil, or Parnate in the last 14 days Trouble passing urine An unusual reaction to duloxetine, other medications, foods, dyes, or preservatives Pregnant or trying to get pregnant Breastfeeding How should I use this medication? Take this medication by mouth with water. Take it as directed on the prescription label at the same time every day. Do not cut, crush, or chew this medication. Swallow the capsules whole. Some capsules may be opened and sprinkled on applesauce. Check with your care team or pharmacist if you are not sure. You can take this medication with or without food. Do not take your medication more often than directed. Do not stop taking this medication suddenly except upon the advice of your care team. Stopping this medication too quickly may cause serious side effects or your condition may worsen. A special MedGuide will be given to you by the pharmacist with each prescription and refill. Be sure to read this information carefully each time. Talk to your care team about the use of this medication in  children. While it may be prescribed for children as young as 7 years for selected conditions, precautions do apply. Overdosage: If you think you have taken too much of this medicine contact a poison control center or emergency room at once. NOTE: This medicine is only for you. Do not share this medicine with others. What if I miss a dose? If you miss a dose, take it as soon as you can. If it is almost time for your next dose, take only that dose. Do not take double or extra doses. What may interact with this medication? Do not take this medication with any of the following: Desvenlafaxine Levomilnacipran Linezolid MAOIs, such as Carbex, Eldepryl, Emsam, Marplan, Nardil, and Parnate Methylene blue (injected into a vein) Milnacipran Safinamide Thioridazine Venlafaxine Viloxazine This medication may also interact with the following: Alcohol Amphetamines Aspirin and aspirin-like medications Certain antibiotics, such as ciprofloxacin and enoxacin Certain medications for blood pressure, heart disease, irregular heart beat Certain medications for mental health conditions Certain medications for migraine headache, such as almotriptan, eletriptan, frovatriptan, naratriptan, rizatriptan, sumatriptan, zolmitriptan Certain medications that treat or prevent blood clots, such as warfarin, enoxaparin, and dalteparin Cimetidine Fentanyl Lithium NSAIDS, medications for pain and inflammation, such as ibuprofen or naproxen Phentermine Procarbazine Rasagiline Sibutramine St. John's Wort Theophylline Tramadol Tryptophan This list may not describe all possible interactions. Give your health care provider a list of all the medicines, herbs, non-prescription drugs, or dietary supplements you use. Also tell them if you smoke, drink alcohol, or use illegal drugs. Some items may interact with your medicine. What should I watch for while using this medication? Tell your care team if your  symptoms do not  get better or if they get worse. Visit your care team for regular checks on your progress. Because it may take several weeks to see the full effects of this medication, it is important to continue your treatment as prescribed by your care team. This medication may cause serious skin reactions. They can happen weeks to months after starting the medication. Contact your care team right away if you notice fevers or flu-like symptoms with a rash. The rash may be red or purple and then turn into blisters or peeling of the skin. You may also notice a red rash with swelling of the face, lips, or lymph nodes in your neck or under your arms. Watch for new or worsening thoughts of suicide or depression. This includes sudden changes in mood, behaviors, or thoughts. These changes can happen at any time but are more common in the beginning of treatment or after a change in dose. Call your care team right away if you experience these thoughts or worsening depression. This medication may cause mood and behavior changes, such as anxiety, nervousness, irritability, hostility, restlessness, excitability, hyperactivity, or trouble sleeping. These changes can happen at any time but are more common in the beginning of treatment or after a change in dose. Call your care team right away if you notice any of these symptoms. This medication may affect your coordination, reaction time, or judgment. Do not drive or operate machinery until you know how this medication affects you. Sit up or stand slowly to reduce the risk of dizzy or fainting spells. Drinking alcohol with this medication can increase the risk of these side effects. This medication may increase blood sugar. The risk may be higher in patients who already have diabetes. Ask your care team what you can do to lower your risk of diabetes while taking this medication. This medication can cause an increase in blood pressure. This medication can also cause a sudden drop in your  blood pressure, which may make you feel faint and increase the chance of a fall. These effects are most common when you first start the medication or when the dose is increased, or during use of other medications that can cause a sudden drop in blood pressure. Check with your care team for instructions on monitoring your blood pressure while taking this medication. Your mouth may get dry. Chewing sugarless gum or sucking hard candy and drinking plenty of water may help. Contact your care team if the problem does not go away or is severe. What side effects may I notice from receiving this medication? Side effects that you should report to your care team as soon as possible: Allergic reactions--skin rash, itching, hives, swelling of the face, lips, tongue, or throat Bleeding--bloody or black, tar-like stools, red or dark brown urine, vomiting blood or brown material that looks like coffee grounds, small, red or purple spots on skin, unusual bleeding or bruising Increase in blood pressure Liver injury--right upper belly pain, loss of appetite, nausea, light-colored stool, dark yellow or brown urine, yellowing skin or eyes, unusual weakness or fatigue Low sodium level--muscle weakness, fatigue, dizziness, headache, confusion Redness, blistering, peeling, or loosening of the skin, including inside the mouth Serotonin syndrome--irritability, confusion, fast or irregular heartbeat, muscle stiffness, twitching muscles, sweating, high fever, seizures, chills, vomiting, diarrhea Sudden eye pain or change in vision such as blurry vision, seeing halos around lights, vision loss Thoughts of suicide or self-harm, worsening mood, feelings of depression Trouble passing urine Side effects that  usually do not require medical attention (report to your care team if they continue or are bothersome): Change in sex drive or performance Constipation Diarrhea Dizziness Dry mouth Excessive sweating Loss of  appetite Nausea Vomiting This list may not describe all possible side effects. Call your doctor for medical advice about side effects. You may report side effects to FDA at 1-800-FDA-1088. Where should I keep my medication? Keep out of the reach of children and pets. Store at room temperature between 15 and 30 degrees C (59 to 86 degrees F). Get rid of any unused medication after the expiration date. To get rid of medications that are no longer needed or have expired: Take the medication to a medication take-back program. Check with your pharmacy or law enforcement to find a location. If you cannot return the medication, check the label or package insert to see if the medication should be thrown out in the garbage or flushed down the toilet. If you are not sure, ask your care team. If it is safe to put it in the trash, take the medication out of the container. Mix the medication with cat litter, dirt, coffee grounds, or other unwanted substance. Seal the mixture in a bag or container. Put it in the trash. NOTE: This sheet is a summary. It may not cover all possible information. If you have questions about this medicine, talk to your doctor, pharmacist, or health care provider.  2024 Elsevier/Gold Standard (2021-11-04 00:00:00)

## 2023-03-20 NOTE — Telephone Encounter (Signed)
Patient called to let us know his behavior help doctor can't send him to a pain medication management, spoke with alyssa she will put in a new referral for him  in.

## 2023-04-03 ENCOUNTER — Other Ambulatory Visit: Payer: Self-pay | Admitting: Nurse Practitioner

## 2023-04-03 DIAGNOSIS — F411 Generalized anxiety disorder: Secondary | ICD-10-CM

## 2023-04-04 ENCOUNTER — Other Ambulatory Visit: Payer: Self-pay | Admitting: Nurse Practitioner

## 2023-04-04 DIAGNOSIS — F411 Generalized anxiety disorder: Secondary | ICD-10-CM

## 2023-04-10 ENCOUNTER — Telehealth: Payer: Self-pay

## 2023-04-10 ENCOUNTER — Telehealth: Payer: Self-pay | Admitting: Psychiatry

## 2023-04-10 ENCOUNTER — Other Ambulatory Visit: Payer: Self-pay | Admitting: Nurse Practitioner

## 2023-04-10 DIAGNOSIS — F411 Generalized anxiety disorder: Secondary | ICD-10-CM

## 2023-04-10 MED ORDER — ALPRAZOLAM 0.5 MG PO TABS: 1.2500 mg | ORAL_TABLET | ORAL | 0 refills | Status: DC

## 2023-04-10 NOTE — Telephone Encounter (Signed)
 Provider wanted me to reach out to PT to see if he could be seen today at 11:40 due to him having issues with medications. PT was unable to but has a follow up scheduled for 3/13. PT states that he is prescribed Xanex but also takes Percocet and was informed by pharmacist that he can't take those together so he's cutting back on Xanex. He states he's experiencing sweating and isn't sleeping well. I informed PT to go to his nearest ED (as per provider) due to his withdrawal symptoms. Provider says she will call him later today and he will decide if he's going to ED.

## 2023-04-10 NOTE — Telephone Encounter (Signed)
 The patient has been taking Xanax and is currently in the process of tapering off the medication. He began reducing his dosage about a week ago, as he did fill a prescription on the seventeenth of january from Primary care who was prescribing this medication for several years. He took only one Xanax yesterday, resulting in a rough night with poor sleep, described as 'waking up every five minutes.'  He started cutting back on the Xanax since the past few days.  He was previously being prescribed Xanax at a dose of 0.5 mg three times a day.  Patient offered an appointment to come into the office for an evaluation however declined.  Patient hence was contacted by phone advised to make the following changes to taper off the Xanax gradually since he is currently having withdrawal symptoms of sweats and sleep issues.  Patient denies any other withdrawal symptoms at this time. Patient advised to take Xanax 0.5 mg, morning dose around 9 AM, Xanax 0.5 mg the midday dose around 1 PM, and the evening dose between 8 and 9 PM Xanax half tablet of the 0.5 mg.  He is also on opioid medication, taking three doses a day, with some days allowing for a fourth dose. He typically takes his opioid medication in the mid-morning and close to bedtime, around 6 PM. He acknowledges sometimes combining Xanax and opioids but tries to space them out.  Patient advised to not combine these 2 medications together, provided education about drug to drug interaction with opioids again.  Patient was provided education at his first visit at this practice regarding benzodiazepine therapy and long-term risk as well as interaction with opioids.  Patient provided education again.  Patient voiced understanding and is interested in tapering off.  Patient advised to monitor his withdrawal symptoms and if any worsening symptoms agreed to go to the nearest emergency department.  His girlfriend also will monitor his symptoms closely and get him help if  needed as per patient.  He is currently taking Cymbalta and 40 mg of Prozac. He is unsure about when to stop taking Prozac, as it was mentioned to be continued for 30 to 60 days. He reports minimal noticeable effects from Cymbalta, with only a brief period of feeling better when he first started the medication.  Long-term plan is to taper off Prozac.  Advised patient to stay on the current dosage for now until his next appointment at this office in 2 weeks.  At that time will consider tapering Prozac further.  New prescription for Xanax with instructions was sent to his pharmacy per request.

## 2023-04-10 NOTE — Telephone Encounter (Signed)
 Already done in another phone note

## 2023-04-10 NOTE — Telephone Encounter (Signed)
 pt states that he needs to go back on the xanax and prozac. states his pcp will not refill. he states he tried tappering off but he is not sleeping and he is sweating.

## 2023-04-17 ENCOUNTER — Ambulatory Visit (INDEPENDENT_AMBULATORY_CARE_PROVIDER_SITE_OTHER): Payer: PPO | Admitting: Nurse Practitioner

## 2023-04-17 ENCOUNTER — Telehealth: Payer: Self-pay | Admitting: Nurse Practitioner

## 2023-04-17 ENCOUNTER — Encounter: Payer: Self-pay | Admitting: Nurse Practitioner

## 2023-04-17 VITALS — BP 110/88 | HR 105 | Temp 98.4°F | Resp 16 | Ht 72.0 in | Wt 215.6 lb

## 2023-04-17 DIAGNOSIS — M5442 Lumbago with sciatica, left side: Secondary | ICD-10-CM | POA: Diagnosis not present

## 2023-04-17 DIAGNOSIS — M47816 Spondylosis without myelopathy or radiculopathy, lumbar region: Secondary | ICD-10-CM | POA: Diagnosis not present

## 2023-04-17 DIAGNOSIS — M5441 Lumbago with sciatica, right side: Secondary | ICD-10-CM

## 2023-04-17 DIAGNOSIS — I10 Essential (primary) hypertension: Secondary | ICD-10-CM

## 2023-04-17 DIAGNOSIS — F1120 Opioid dependence, uncomplicated: Secondary | ICD-10-CM | POA: Diagnosis not present

## 2023-04-17 DIAGNOSIS — G8929 Other chronic pain: Secondary | ICD-10-CM | POA: Diagnosis not present

## 2023-04-17 DIAGNOSIS — Z79899 Other long term (current) drug therapy: Secondary | ICD-10-CM | POA: Diagnosis not present

## 2023-04-17 DIAGNOSIS — E291 Testicular hypofunction: Secondary | ICD-10-CM

## 2023-04-17 LAB — POCT URINE DRUG SCREEN
Methylenedioxyamphetamine: NOT DETECTED
POC Amphetamine UR: NOT DETECTED
POC BENZODIAZEPINES UR: POSITIVE — AB
POC Barbiturate UR: NOT DETECTED
POC Cocaine UR: NOT DETECTED
POC Ecstasy UR: NOT DETECTED
POC Marijuana UR: NOT DETECTED
POC Methadone UR: NOT DETECTED
POC Methamphetamine UR: NOT DETECTED
POC Opiate Ur: NOT DETECTED
POC Oxycodone UR: POSITIVE — AB
POC PHENCYCLIDINE UR: NOT DETECTED
POC TRICYCLICS UR: NOT DETECTED

## 2023-04-17 MED ORDER — TIZANIDINE HCL 2 MG PO TABS: ORAL_TABLET | ORAL | 1 refills | Status: DC

## 2023-04-17 MED ORDER — OXYCODONE-ACETAMINOPHEN 7.5-325 MG PO TABS: 1.0000 | ORAL_TABLET | Freq: Three times a day (TID) | ORAL | 0 refills | Status: DC | PRN

## 2023-04-17 MED ORDER — IRBESARTAN 75 MG PO TABS: 75.0000 mg | ORAL_TABLET | Freq: Every day | ORAL | 3 refills | Status: AC

## 2023-04-17 MED ORDER — HYDROCHLOROTHIAZIDE 12.5 MG PO CAPS: 12.5000 mg | ORAL_CAPSULE | Freq: Every morning | ORAL | 3 refills | Status: DC

## 2023-04-17 MED ORDER — TESTOSTERONE CYPIONATE 200 MG/ML IM SOLN: INTRAMUSCULAR | 0 refills | Status: DC

## 2023-04-17 NOTE — Telephone Encounter (Signed)
 Awaiting 04/17/23 office notes for Pain Mgmt referral-Toni

## 2023-04-17 NOTE — Progress Notes (Signed)
 [facility] [address] [place], [place] [address]  Internal MEDICINE  Office Visit Note  Patient Name: Nathan Meyer  [id number]  [id number]  Date of Service: 04/17/2023  Chief Complaint  Patient presents with   Depression   Gastroesophageal Reflux   Hyperlipidemia   Follow-up    HPI Sherif presents for a follow-up visit for chronic pain, anxiety, depression, hypertension  Chronic back pain -- has been on percocet for many years. His effective dose has gradually increased over the time I have been his PCP thus far. He needs to be managed by a pain medicine specialist. He was sent to [facility] pain [facility] but this was not a good fit so he needs a new referral to a different clinic.  Anxiety -- seeing psychiatry who is working with him now  Depression --seeing psychiatry who is working with him now.  Hypertension -- well controlled on irbesartan  High cholesterol -- takes atorvastatin     Current Medication: Outpatient Encounter Medications as of 04/17/2023  Medication Sig   atorvastatin (LIPITOR) 20 MG tablet Take 1 tablet (20 mg total) by mouth at bedtime.   Cholecalciferol (VITAMIN D3 PO) Take by mouth.   Cyanocobalamin (B-12 PO) Take by mouth.   lansoprazole (PREVACID) 15 MG capsule TAKE 1 CAPSULE BY MOUTH ONCE DAILY   Magnesium Gluconate 550 MG TABS Take by mouth.   Multiple Vitamin (MULTIVITAMIN ADULT PO) Take by mouth.   SYRINGE-NEEDLE, DISP, 3 ML (B-D SYRINGE/NEEDLE 3CC/23GX1") 23G X 1" 3 ML MISC Use 1 syringe with needle every 21 days; To be used for IM testosterone injection.   traZODone (DESYREL) 100 MG tablet Take 1-2 tablets (100-200 mg total) by mouth at bedtime. Takes (2) 100mg  tabs every night   [DISCONTINUED] ALPRAZolam (XANAX) 0.5 MG tablet Take 2.5 tablets (1.25 mg total) by mouth as directed for 20 days. Take ONE tablet daily at 9 AM , ONE tablet daily at 1 PM and HALF tablet daily at 8 PM   [DISCONTINUED] DULoxetine (CYMBALTA) 30 MG capsule Take 1  capsule (30 mg total) by mouth daily with supper.   [DISCONTINUED] FLUoxetine (PROZAC) 40 MG capsule TAKE 1 CAPSULE BY MOUTH ONCE DAILY AS DIRECTED.   [DISCONTINUED] hydrochlorothiazide (MICROZIDE) 12.5 MG capsule Take 1 capsule (12.5 mg total) by mouth every morning.   [DISCONTINUED] irbesartan (AVAPRO) 75 MG tablet Take 1 tablet (75 mg total) by mouth daily.   [DISCONTINUED] oxyCODONE-acetaminophen (PERCOCET) 7.5-325 MG tablet Take 1 tablet by mouth every 8 (eight) hours as needed for severe pain (pain score 7-10). May take 1 extra tablet once daily as needed for breakthrough pain.   [DISCONTINUED] testosterone cypionate (DEPOTESTOSTERONE CYPIONATE) 200 MG/ML injection Inject 0.5 mLs (100 mg total) into the muscle every 21 days.   [DISCONTINUED] tiZANidine (ZANAFLEX) 2 MG tablet TAKE 1 TABLET BY MOUTH EVERY 6 HOURS AS NEEDED FOR MUSCLE SPASM **NO MORE THAN 4 TABS IN 24 HOURS**   [DISCONTINUED] valsartan (DIOVAN) 80 MG tablet Take by mouth.   hydrochlorothiazide (MICROZIDE) 12.5 MG capsule Take 1 capsule (12.5 mg total) by mouth every morning.   irbesartan (AVAPRO) 75 MG tablet Take 1 tablet (75 mg total) by mouth daily.   oxyCODONE-acetaminophen (PERCOCET) 7.5-325 MG tablet Take 1 tablet by mouth every 8 (eight) hours as needed for severe pain (pain score 7-10).   testosterone cypionate (DEPOTESTOSTERONE CYPIONATE) 200 MG/ML injection Inject 0.5 mLs (100 mg total) into the muscle every 21 days.   tiZANidine (ZANAFLEX) 2 MG tablet TAKE 1  TABLET BY MOUTH EVERY 6 HOURS AS NEEDED FOR MUSCLE SPASM **NO MORE THAN 4 TABS IN 24 HOURS**   [DISCONTINUED] minocycline (MINOCIN) 100 MG capsule Take 1 capsule (100 mg total) by mouth daily.   No facility-administered encounter medications on file as of 04/17/2023.    Surgical History: Past Surgical History:  Procedure Laterality Date   ANTERIOR FUSION CERVICAL SPINE  1995   finger debridement      Medical History: Past Medical History:  Diagnosis Date    Anxiety    Arthritis    Cyst, kidney, acquired    Depression    GERD (gastroesophageal reflux disease)    Hyperlipidemia    Neuromuscular disorder (HCC)    Spine degeneration    Substance abuse (HCC)     Family History: Family History  Problem Relation Age of Onset   Arthritis Mother    Hypertension Mother    Hyperlipidemia Mother    Anxiety disorder Mother    Depression Father    COPD Father    Alcohol abuse Maternal Uncle    Alcohol abuse Paternal Uncle    Alcohol abuse Paternal Grandfather    Alcohol abuse Cousin     Social History   Socioeconomic History   Marital status: Divorced    Spouse name: Not on file   Number of children: 0   Years of education: Not on file   Highest education level: High school graduate  Occupational History   Not on file  Tobacco Use   Smoking status: Former    Current packs/day: 0.00    Average packs/day: 1 pack/day for 38.3 years (38.3 ttl pk-yrs)    Types: Cigarettes    Start date: 63    Quit date: 05/29/2021    Years since quitting: 1.9   Smokeless tobacco: Never  Vaping Use   Vaping status: Never Used  Substance and Sexual Activity   Alcohol use: No   Drug use: Not Currently    Comment: former substance abuser - cocaine, pills, marijuana   Sexual activity: Not Currently  Other Topics Concern   Not on file  Social History Narrative   Not on file   Social Drivers of Health   Financial Resource Strain: Not on file  Food Insecurity: Not on file  Transportation Needs: Not on file  Physical Activity: Not on file  Stress: Not on file  Social Connections: Not on file  Intimate Partner Violence: Not on file      Review of Systems  Constitutional:  Positive for activity change and fatigue. Negative for chills and unexpected weight change.  HENT:  Negative for congestion, rhinorrhea, sneezing and sore throat.   Eyes:  Negative for redness.  Respiratory: Negative.  Negative for cough, chest tightness, shortness of  breath and wheezing.   Cardiovascular: Negative.  Negative for chest pain and palpitations.  Gastrointestinal:  Negative for abdominal pain, constipation, diarrhea, nausea and vomiting.  Genitourinary:  Negative for dysuria and frequency.  Musculoskeletal:  Positive for arthralgias and back pain. Negative for joint swelling and neck pain.  Neurological: Negative.  Negative for tremors and numbness.  Hematological:  Negative for adenopathy. Does not bruise/bleed easily.  Psychiatric/Behavioral:  Positive for behavioral problems (Depression) and decreased concentration. Negative for self-injury, sleep disturbance and suicidal ideas. The patient is nervous/anxious.     Vital Signs: BP 110/88   Pulse (!) 105   Temp 98.4 F (36.9 C)   Resp 16   Ht 6' (1.829 m)   Wt 215  lb 9.6 oz (97.8 kg)   SpO2 94%   BMI 29.24 kg/m    Physical Exam Vitals reviewed.  Constitutional:      General: He is not in acute distress.    Appearance: Normal appearance. He is obese. He is not ill-appearing.  HENT:     Head: Normocephalic and atraumatic.  Eyes:     Pupils: Pupils are equal, round, and reactive to light.  Cardiovascular:     Rate and Rhythm: Normal rate and regular rhythm.  Pulmonary:     Effort: Pulmonary effort is normal. No respiratory distress.  Neurological:     Mental Status: He is alert and oriented to person, place, and time.  Psychiatric:        Mood and Affect: Mood normal.        Behavior: Behavior normal.        Assessment/Plan: 1. Essential hypertension (Primary) Stable, continue irbesartan and hydrochlorothiazide as prescribed.  - irbesartan (AVAPRO) 75 MG tablet; Take 1 tablet (75 mg total) by mouth daily.  Dispense: 90 tablet; Refill: 3 - hydrochlorothiazide (MICROZIDE) 12.5 MG capsule; Take 1 capsule (12.5 mg total) by mouth every morning.  Dispense: 90 capsule; Refill: 3  2. Chronic bilateral low back pain with bilateral sciatica Referred to [facility]. Continue percocet anf tizanidine as prescribed.  - oxyCODONE-acetaminophen (PERCOCET) 7.5-325 MG tablet; Take 1 tablet by mouth every 8 (eight) hours as needed for severe pain (pain score 7-10).  Dispense: 90 tablet; Refill: 0 - tiZANidine (ZANAFLEX) 2 MG tablet; TAKE 1 TABLET BY MOUTH EVERY 6 HOURS AS NEEDED FOR MUSCLE SPASM **NO MORE THAN 4 TABS IN 24 HOURS**  Dispense: 120 tablet; Refill: 1 - Ambulatory referral to Pain Clinic  3. Lumbar facet arthropathy Continue percocet and tizanidine as prescribed, referred to [facility]  - oxyCODONE-acetaminophen (PERCOCET) 7.5-325 MG tablet; Take 1 tablet by mouth every 8 (eight) hours as needed for severe pain (pain score 7-10).  Dispense: 90 tablet; Refill: 0 - tiZANidine (ZANAFLEX) 2 MG tablet; TAKE 1 TABLET BY MOUTH EVERY 6 HOURS AS NEEDED FOR MUSCLE SPASM **NO MORE THAN 4 TABS IN 24 HOURS**  Dispense: 120 tablet; Refill: 1 - Ambulatory referral to Pain Clinic  4. Hypogonadism in male Continue TRT therapy, will order labs at next visit  - testosterone cypionate (DEPOTESTOSTERONE CYPIONATE) 200 MG/ML injection; Inject 0.5 mLs (100 mg total) into the muscle every 21 days.  Dispense: 10 mL; Refill: 0  5. Encounter for long-term current use of high risk medication UDS done today, positive for benzos and oxycodone which is consistent with his current prescriptions  - POCT Urine Drug Screen  6. Uncomplicated opioid dependence (HCC) Referred to [facility] - Ambulatory referral to Pain Clinic   General Counseling: Emrah verbalizes understanding of the findings of todays visit and agrees with plan of treatment. I have discussed any further diagnostic evaluation that may be needed or ordered today. We also reviewed his medications today. he has been encouraged to call the office with any questions or concerns that should arise related to todays visit.    Orders Placed This Encounter  Procedures   Ambulatory  referral to Pain Clinic   POCT Urine Drug Screen    Meds ordered this encounter  Medications   testosterone cypionate (DEPOTESTOSTERONE CYPIONATE) 200 MG/ML injection    Sig: Inject 0.5 mLs (100 mg total) into the muscle every 21 days.    Dispense:  10 mL    Refill:  0    Fill script on start date with syringes   oxyCODONE-acetaminophen (PERCOCET) 7.5-325 MG tablet    Sig: Take 1 tablet by mouth every 8 (eight) hours as needed for severe pain (pain score 7-10).    Dispense:  90 tablet    Refill:  0    Fill for now, patient will need to call clinic when refill is due   irbesartan (AVAPRO) 75 MG tablet    Sig: Take 1 tablet (75 mg total) by mouth daily.    Dispense:  90 tablet    Refill:  3   hydrochlorothiazide (MICROZIDE) 12.5 MG capsule    Sig: Take 1 capsule (12.5 mg total) by mouth every morning.    Dispense:  90 capsule    Refill:  3   tiZANidine (ZANAFLEX) 2 MG tablet    Sig: TAKE 1 TABLET BY MOUTH EVERY 6 HOURS AS NEEDED FOR MUSCLE SPASM **NO MORE THAN 4 TABS IN 24 HOURS**    Dispense:  120 tablet    Refill:  1    FOR NEXT FILL. THANK YOU.    Return for AWV scheduled in may.   Total time spent:30 Minutes Time spent includes review of chart, medications, test results, and follow up plan with the patient.   [place] Controlled Substance Database was reviewed by me.  This patient was seen by Sallyanne Kuster, FNP-C in collaboration with Dr. Beverely Risen as a part of collaborative care agreement.   Deloris Moger R. Tedd Sias, MSN, FNP-C Internal medicine

## 2023-04-27 ENCOUNTER — Ambulatory Visit (INDEPENDENT_AMBULATORY_CARE_PROVIDER_SITE_OTHER): Payer: PPO | Admitting: Psychiatry

## 2023-04-27 ENCOUNTER — Encounter: Payer: Self-pay | Admitting: Psychiatry

## 2023-04-27 ENCOUNTER — Other Ambulatory Visit: Payer: Self-pay

## 2023-04-27 VITALS — BP 128/75 | HR 89 | Temp 98.2°F | Ht 72.0 in | Wt 214.0 lb

## 2023-04-27 DIAGNOSIS — G4700 Insomnia, unspecified: Secondary | ICD-10-CM | POA: Diagnosis not present

## 2023-04-27 DIAGNOSIS — F331 Major depressive disorder, recurrent, moderate: Secondary | ICD-10-CM

## 2023-04-27 DIAGNOSIS — F411 Generalized anxiety disorder: Secondary | ICD-10-CM | POA: Diagnosis not present

## 2023-04-27 DIAGNOSIS — F1320 Sedative, hypnotic or anxiolytic dependence, uncomplicated: Secondary | ICD-10-CM | POA: Insufficient documentation

## 2023-04-27 MED ORDER — DULOXETINE HCL 30 MG PO CPEP: 30.0000 mg | ORAL_CAPSULE | Freq: Two times a day (BID) | ORAL | 1 refills | Status: DC

## 2023-04-27 MED ORDER — FLUOXETINE HCL 20 MG PO CAPS: 20.0000 mg | ORAL_CAPSULE | Freq: Every day | ORAL | Status: DC

## 2023-04-27 MED ORDER — ALPRAZOLAM 0.5 MG PO TABS: 1.0000 mg | ORAL_TABLET | ORAL | 0 refills | Status: DC

## 2023-04-27 NOTE — Patient Instructions (Signed)
 Please let me know if you need Prozac 20 mg send to pharmacy. As per our discussion you want to try using Prozac 40 mg pills that you have left and want to take it every other day for 2 weeks and stop.  Serotonin Syndrome Serotonin is a chemical that helps to control several functions in the body. This chemical is also called a neurotransmitter. It controls: Brain and nerve cell function. Mood and emotions. Memory. Eating. Sleeping. Sexual activity. Stress response. Having too much serotonin in your body can cause serotonin syndrome. This condition can be harmful to your brain and nerve cells. This can be a life-threatening condition. What are the causes? This condition may be caused by taking medicines or drugs that increase the level of serotonin in your body, such as: Antidepressant medicines. Migraine medicines. Certain pain medicines. Certain drugs, including ecstasy, LSD, cocaine, and amphetamines. Over-the-counter cough or cold medicines that contain dextromethorphan. Certain herbal supplements, including St. John's wort, ginseng, and nutmeg. This condition usually occurs when you take these medicines or drugs together, but it can also happen with a high dose of a single medicine or drug. What increases the risk? You are more likely to develop this condition if: You just started taking a medicine or drug that increases the level of serotonin in the body. You recently increased the dose of a medicine or drug that increases the level of serotonin in the body. You take more than one medicine or drug that increases the level of serotonin in the body. What are the signs or symptoms? Symptoms of this condition usually start within several hours of taking a medicine or drug. Symptoms may be mild or severe. Mild symptoms include: Sweating. Restlessness or agitation. Muscle twitching or stiffness. Rapid heart rate. Nausea, vomiting, or diarrhea. Shivering or goose  bumps. Confusion. Severe symptoms include: Irregular heartbeat. Seizures. Loss of consciousness. High fever. How is this diagnosed? This condition may be diagnosed based on: Your medical history. A physical exam. Your prior use of drugs and medicines. Blood or urine tests. These may be used to rule out other causes of your symptoms. How is this treated? The treatment for this condition depends on the severity of your symptoms. For mild cases, stopping the medicine or drug that caused your condition is usually all that is needed. For moderate to severe cases, treatment in a hospital may be needed to prevent or treat life-threatening symptoms. Treatment may include: Medicines to control your symptoms. IV fluids. Actions to support your breathing. Treatments to control your body temperature. Follow these instructions at home: Medicines  Take over-the-counter and prescription medicines only as told by your health care provider. Check with your health care provider before you start taking any new prescriptions, over-the-counter medicines, herbs, or supplements. Do not combine any medicines that can cause this condition. Lifestyle  Maintain a healthy lifestyle. Eat a healthy diet that includes plenty of vegetables, fruits, whole grains, low-fat dairy products, and lean protein. Do not eat a lot of foods that are high in fat, added sugars, or salt. Get the right amount and quality of sleep. Most adults need 7-9 hours of sleep each night. Make time to exercise, even if it is only for short periods of time. Most adults should exercise for at least 150 minutes each week. Do not drink alcohol. Do not use illegal drugs. Do not take medicines for reasons other than they are prescribed. General instructions Do not use any products that contain nicotine or tobacco. These  products include cigarettes, chewing tobacco, and vaping devices, such as e-cigarettes. If you need help quitting, ask your  health care provider. Contact a health care provider if: Your symptoms do not improve or they get worse. Get help right away if: You have worsening confusion, severe headache, chest pain, high fever, seizures, or loss of consciousness. You experience serious side effects of medicine, such as swelling of your face, lips, tongue, or throat. These symptoms may be an emergency. Get help right away. Call 911. Do not wait to see if the symptoms will go away. Do not drive yourself to the hospital. Also, get help right away if: You have serious thoughts about hurting yourself or others. Take one of these steps if you feel like you may hurt yourself or others, or have thoughts about taking your own life: Go to your nearest emergency room. Call 911. Call the National Suicide Prevention Lifeline at (365)728-1988 or 988. This is open 24 hours a day. Text the Crisis Text Line at 276-791-3427. Summary Serotonin is a chemical that helps to control several functions in the body. High levels of serotonin in the body can cause serotonin syndrome, which can be life-threatening. This condition may be caused by taking medicines or drugs that increase the level of serotonin in your body. Treatment depends on the severity of your symptoms. For mild cases, stopping the medicine or drug that caused your condition is usually all that is needed. Check with your health care provider before you start taking any new prescriptions, over-the-counter medicines, herbs, or supplements. This information is not intended to replace advice given to you by your health care provider. Make sure you discuss any questions you have with your health care provider. Document Revised: 04/22/2021 Document Reviewed: 04/22/2021 Elsevier Patient Education  2024 [employer].

## 2023-04-27 NOTE — Progress Notes (Unsigned)
 [facility] MD OP Progress Note  04/27/2023 10:30 AM Nathan Meyer  MRN:  [id number]  Chief Complaint:  Chief Complaint  Patient presents with   Follow-up   Anxiety   Depression   Medication Refill   Insomnia   HPI: Nathan Meyer is a 63 year old Caucasian male, lives with girlfriend in [place], has a history of MDD, GAD, insomnia, chronic pain was evaluated in office today for a follow-up appointment.  He has a history of major depression and generalized anxiety disorder, with symptoms worsening over the past two years. He feels unable to engage in daily tasks, feels useless, and has poor sleep habits, partly due to his girlfriend's snoring. He is currently taking duloxetine for anxiety and depression, which he feels is helping to some extent.  He has been experiencing difficulties with medication management, particularly with Xanax. He was instructed to cut his Xanax dosage in half but continued taking three pills a day until realizing the mistake. He has since started reducing the dosage as instructed, but finds it challenging, especially in the evenings when he feels he needs a higher dose. He is also managing pain with oxycodone, prescribed as needed for severe pain, and expresses confusion about the prescription instructions, which state to take it every eight hours for severe pain. He also uses Tylenol in conjunction with oxycodone for pain relief.  He has spinal stenosis, contributing to his chronic pain. A recent incident where his girlfriend accidentally sat on his leg exacerbated his back and ankle pain. He describes his girlfriend as weighing approximately 300 pounds, which intensified the pain in his back and ankle, causing him to scream out in pain. He has previously been treated with steroids, which caused thrush and mood changes, and is currently using muscle relaxants like tizanidine. He reports a recent increase in neck pain, which radiates to his shoulder, and describes it  as feeling like a tear.  He is currently taking trazodone for sleep, usually one and a half pills, but sometimes two, depending on his level of sleepiness before bed. He reports feeling anxious and sweaty as withdrawal symptoms from Xanax, which have somewhat resolved since resuming the medication. He also mentions experiencing neck pain radiating to the shoulder, and occasional heart palpitations.  He has a history of substance use over thirty years ago but reports no recent use. He has family support, including from his mother and sister.  Visit Diagnosis:    ICD-10-CM   1. MDD (major depressive disorder), recurrent episode, moderate (HCC)  F33.1 DULoxetine (CYMBALTA) 30 MG capsule    FLUoxetine (PROZAC) 20 MG capsule    2. GAD (generalized anxiety disorder)  F41.1 DULoxetine (CYMBALTA) 30 MG capsule    FLUoxetine (PROZAC) 20 MG capsule    ALPRAZolam (XANAX) 0.5 MG tablet    3. Insomnia, unspecified type  G47.00     4. Benzodiazepine dependence (HCC)  F13.20       Past Psychiatric History: I have reviewed past psychiatric history from progress note on 03/20/2023.  Past trials of Zoloft, fluoxetine  Past Medical History:  Past Medical History:  Diagnosis Date   Anxiety    Arthritis    Cyst, kidney, acquired    Depression    GERD (gastroesophageal reflux disease)    Hyperlipidemia    Neuromuscular disorder (HCC)    Spine degeneration    Substance abuse (HCC)     Past Surgical History:  Procedure Laterality Date   ANTERIOR FUSION CERVICAL SPINE  1995  finger debridement      Family Psychiatric History: I have reviewed family psychiatric history from progress note on 03/20/2023.  Family History:  Family History  Problem Relation Age of Onset   Arthritis Mother    Hypertension Mother    Hyperlipidemia Mother    Anxiety disorder Mother    Depression Father    COPD Father    Alcohol abuse Maternal Uncle    Alcohol abuse Paternal Uncle    Alcohol abuse Paternal  Grandfather    Alcohol abuse Cousin     Social History: I have reviewed social history from progress note on 03/20/2023. Social History   Socioeconomic History   Marital status: Divorced    Spouse name: Not on file   Number of children: 0   Years of education: Not on file   Highest education level: High school graduate  Occupational History   Not on file  Tobacco Use   Smoking status: Former    Current packs/day: 0.00    Average packs/day: 1 pack/day for 38.3 years (38.3 ttl pk-yrs)    Types: Cigarettes    Start date: 22    Quit date: 05/29/2021    Years since quitting: 1.9   Smokeless tobacco: Never  Vaping Use   Vaping status: Never Used  Substance and Sexual Activity   Alcohol use: No   Drug use: Not Currently    Comment: former substance abuser - cocaine, pills, marijuana   Sexual activity: Not Currently  Other Topics Concern   Not on file  Social History Narrative   Not on file   Social Drivers of Health   Financial Resource Strain: Not on file  Food Insecurity: Not on file  Transportation Needs: Not on file  Physical Activity: Not on file  Stress: Not on file  Social Connections: Not on file    Allergies:  Allergies  Allergen Reactions   Nsaids Nausea And Vomiting and Anaphylaxis    Only in very high doses   Prednisone Anaphylaxis   Tolmetin Nausea And Vomiting    Only in very high doses    Metabolic Disorder Labs: Lab Results  Component Value Date   HGBA1C 5.6 12/15/2022   No results found for: "PROLACTIN" Lab Results  Component Value Date   CHOL 132 12/15/2022   TRIG 118 12/15/2022   HDL 36 (L) 12/15/2022   CHOLHDL 3.7 12/15/2022   LDLCALC 75 12/15/2022   LDLCALC 91 06/16/2022   Lab Results  Component Value Date   TSH 1.860 05/17/2021    Therapeutic Level Labs: No results found for: "LITHIUM" No results found for: "VALPROATE" No results found for: "CBMZ"  Current Medications: Current Outpatient Medications  Medication Sig  Dispense Refill   atorvastatin (LIPITOR) 20 MG tablet Take 1 tablet (20 mg total) by mouth at bedtime. 90 tablet 3   Cholecalciferol (VITAMIN D3 PO) Take by mouth.     Cyanocobalamin (B-12 PO) Take by mouth.     FLUoxetine (PROZAC) 20 MG capsule Take 1 capsule (20 mg total) by mouth daily for 15 days. Wants to hold off sending this to pharmacy per patient     hydrochlorothiazide (MICROZIDE) 12.5 MG capsule Take 1 capsule (12.5 mg total) by mouth every morning. 90 capsule 3   irbesartan (AVAPRO) 75 MG tablet Take 1 tablet (75 mg total) by mouth daily. 90 tablet 3   lansoprazole (PREVACID) 15 MG capsule TAKE 1 CAPSULE BY MOUTH ONCE DAILY 90 capsule 3   Magnesium Gluconate 550 MG TABS  Take by mouth.     Multiple Vitamin (MULTIVITAMIN ADULT PO) Take by mouth.     oxyCODONE-acetaminophen (PERCOCET) 7.5-325 MG tablet Take 1 tablet by mouth every 8 (eight) hours as needed for severe pain (pain score 7-10). 90 tablet 0   SYRINGE-NEEDLE, DISP, 3 ML (B-D SYRINGE/NEEDLE 3CC/23GX1") 23G X 1" 3 ML MISC Use 1 syringe with needle every 21 days; To be used for IM testosterone injection. 50 each 0   testosterone cypionate (DEPOTESTOSTERONE CYPIONATE) 200 MG/ML injection Inject 0.5 mLs (100 mg total) into the muscle every 21 days. 10 mL 0   tiZANidine (ZANAFLEX) 2 MG tablet TAKE 1 TABLET BY MOUTH EVERY 6 HOURS AS NEEDED FOR MUSCLE SPASM **NO MORE THAN 4 TABS IN 24 HOURS** 120 tablet 1   traZODone (DESYREL) 100 MG tablet Take 1-2 tablets (100-200 mg total) by mouth at bedtime. Takes (2) 100mg  tabs every night 180 tablet 3   [START ON 04/30/2023] ALPRAZolam (XANAX) 0.5 MG tablet Take 2 tablets (1 mg total) by mouth as directed for 20 days. Take ONE tablet daily at 9 AM , HALF tablet daily at 1 PM and HALF tablet daily at 8 PM 40 tablet 0   DULoxetine (CYMBALTA) 30 MG capsule Take 1 capsule (30 mg total) by mouth 2 (two) times daily. Dose change 60 capsule 1   No current facility-administered medications for this visit.      Musculoskeletal: Strength & Muscle Tone: within normal limits Gait & Station: normal Patient leans: N/A  Psychiatric Specialty Exam: Review of Systems  Psychiatric/Behavioral:  Positive for dysphoric mood and sleep disturbance. The patient is nervous/anxious.     Blood pressure 128/75, pulse 89, temperature 98.2 F (36.8 C), temperature source Skin, height 6' (1.829 m), weight 214 lb (97.1 kg).Body mass index is 29.02 kg/m.  General Appearance: Fairly Groomed  Eye Contact:  Fair  Speech:  Clear and Coherent  Volume:  Normal  Mood:  Anxious and Depressed  Affect:  Full Range  Thought Process:  Goal Directed and Descriptions of Associations: Intact  Orientation:  Full (Time, Place, and Person)  Thought Content: Logical   Suicidal Thoughts:  No  Homicidal Thoughts:  No  Memory:  Immediate;   Fair Recent;   Fair Remote;   Limited  Judgement:  Fair  Insight:  Fair  Psychomotor Activity:  Normal  Concentration:  Concentration: Fair and Attention Span: Fair  Recall:  [employer] of Knowledge: Fair  Language: Fair  Akathisia:  No  Handed:  Right  AIMS (if indicated): not done  Assets:  Desire for Improvement Housing Social Support Transportation  ADL's:  Intact  Cognition: WNL  Sleep:   varies    Screenings: GAD-7    Loss adjuster, chartered Office Visit from 04/27/2023 in [facility] Office Visit from 03/20/2023 in [facility]  Total GAD-7 Score 13 13      Mini-Mental    Flowsheet Row Office Visit from 06/14/2022 in [facility], [facility] Office Visit from 06/09/2021 in [facility], [facility]  Total Score (max 30 points ) 30 30      PHQ2-9    Flowsheet Row Office Visit from 04/27/2023 in [facility] Office Visit from 03/20/2023 in [facility] Office Visit from 03/07/2022 in [facility] Office Visit from 11/08/2021 in [facility], [facility] Office Visit from 08/11/2021 in [facility], [facility]  PHQ-2 Total  Score 4 0 6 6 0  PHQ-9 Total Score 17 14 18 16  --      Flowsheet Row Office Visit from 04/27/2023 in [facility] Office Visit from 03/20/2023 in [facility]  C-SSRS RISK CATEGORY No Risk Low Risk        Assessment and Plan: Nathan Meyer is a 63 year old Caucasian male with history of depression, anxiety, chronic pain was evaluated in office today for a follow-up appointment.  Discussed assessment and plan as noted below.  Generalized Anxiety Disorder-unstable Generalized anxiety disorder with worsening symptoms over the past two years, particularly before events like dental appointments. Currently on duloxetine, which is being increased, and tapering off ineffective fluoxetine. Aware of potential serotonin syndrome risk due to duloxetine and fluoxetine overlap. Discussed benzodiazepine risks, including cognitive impairment and respiratory depression with opioids. - Increase Duloxetine to 30 mg twice daily. - Taper off fluoxetine: every other day for two weeks then stop. - Discussed changing Fluoxetine to 20 mg tablets and taper it off gradually however patient would like to use Fluoxetine 40 mg that he has left from recent refill and would like to take it every other day.  He will be in touch with this provider if he has any adverse effect from doing this.  He is aware of drug-drug interaction with Cymbalta including serotonin syndrome. - Monitor for serotonin syndrome symptoms: tremors, confusion, muscle rigidity.  Patient advised to get immediate help if he has any withdrawal symptoms or drug interactions. - Follow-up on April 2nd at 3:20 PM.  Major Depressive Disorder-unstable Major depressive disorder with continued depression symptoms. Fluoxetine was ineffective,  leading to Duloxetine addition. Reports some improvement in anxiety and depression with duloxetine. Discussed duloxetine side effects, including pain relief benefits. - Continue Duloxetine, dosage increased as noted above. - Monitor mood and depressive symptoms. - Taper off fluoxetine as planned.  Benzodiazepine Dependence-unstable Benzodiazepine dependence with current Xanax tapering. Experiencing withdrawal symptoms: anxiety, sweating. Aware of benzodiazepine risks, including cognitive impairment and respiratory depression with opioids. Discussed tapering schedule and withdrawal symptoms. - Reduce Xanax 0.5 mg (2 tablets daily): one in the morning, half at lunch and evening. - Monitor withdrawal symptoms and adjust tapering as needed. - Pick up new Xanax prescription on March 16th. - Reviewed  PMP AWARxE - Provided psychoeducation, brief supportive psychotherapy, discussed long-term effects of being on benzodiazepine and the need for the prescribing especially since he is on medications like opioids.  Patient aware of drug-drug interaction.  Insomnia-improving Insomnia managed with trazodone. Varying dosages based on sleep quality. Discussed trazodone side effects and benefits for sleep management. - Continue Trazodone 100-200 mg at bedtime as needed for sleep. - Monitor sleep quality and adjust dosage as needed. - Patient will need sufficient pain management.  Follow-up - Follow-up in clinic in 3 weeks or sooner if needed-April 2 at 3:20 PM. - Crisis plan discussed with patient.  Collaboration of Care: Collaboration of Care: Other patient encouraged to follow up with pain provider for current pain.  Patient/Guardian was advised Release of Information must be obtained prior to any record release in order to collaborate their care with an outside provider. Patient/Guardian was advised if they have not already done so to contact the registration department to sign all necessary forms in  order for [place] to release information regarding their care.   I have spent atleast 40 minutes face to face with patient today which includes the time spent for preparing  to see the patient ( e.g., review of test, records ), obtaining and to review and separately obtained history , ordering medications and test ,psychoeducation and supportive psychotherapy and care coordination,as well as documenting clinical information in electronic health record.   Consent: Patient/Guardian gives verbal consent for treatment and assignment of benefits for services provided during this visit. Patient/Guardian expressed understanding and agreed to proceed.  Discussed the use of a AI scribe software for clinical note transcription with the patient, who gave verbal consent to proceed.  This note was generated in part or whole with voice recognition software. Voice recognition is usually quite accurate but there are transcription errors that can and very often do occur. I apologize for any typographical errors that were not detected and corrected.     Jomarie Longs, MD 04/27/2023, 2:00 PM

## 2023-05-04 NOTE — Telephone Encounter (Signed)
 done

## 2023-05-09 DIAGNOSIS — R051 Acute cough: Secondary | ICD-10-CM | POA: Diagnosis not present

## 2023-05-09 DIAGNOSIS — U071 COVID-19: Secondary | ICD-10-CM | POA: Diagnosis not present

## 2023-05-11 ENCOUNTER — Encounter: Payer: Self-pay | Admitting: Nurse Practitioner

## 2023-05-11 ENCOUNTER — Telehealth: Payer: Self-pay | Admitting: Nurse Practitioner

## 2023-05-11 DIAGNOSIS — G8929 Other chronic pain: Secondary | ICD-10-CM | POA: Insufficient documentation

## 2023-05-11 DIAGNOSIS — E291 Testicular hypofunction: Secondary | ICD-10-CM | POA: Insufficient documentation

## 2023-05-11 NOTE — Telephone Encounter (Signed)
 Pain Management referral faxed to [facility]; [phone]. Lvm notifying patient. Gave pt telephone # [phone]

## 2023-05-17 ENCOUNTER — Encounter: Payer: Self-pay | Admitting: Psychiatry

## 2023-05-17 ENCOUNTER — Telehealth: Admitting: Psychiatry

## 2023-05-17 DIAGNOSIS — F1320 Sedative, hypnotic or anxiolytic dependence, uncomplicated: Secondary | ICD-10-CM

## 2023-05-17 DIAGNOSIS — F331 Major depressive disorder, recurrent, moderate: Secondary | ICD-10-CM | POA: Diagnosis not present

## 2023-05-17 DIAGNOSIS — F411 Generalized anxiety disorder: Secondary | ICD-10-CM

## 2023-05-17 DIAGNOSIS — G4700 Insomnia, unspecified: Secondary | ICD-10-CM | POA: Diagnosis not present

## 2023-05-17 MED ORDER — ALPRAZOLAM 0.5 MG PO TABS: 0.7500 mg | ORAL_TABLET | ORAL | 0 refills | Status: DC

## 2023-05-17 MED ORDER — TRAZODONE HCL 100 MG PO TABS: 100.0000 mg | ORAL_TABLET | Freq: Every day | ORAL | 3 refills | Status: DC

## 2023-05-17 NOTE — Progress Notes (Unsigned)
 Virtual Visit via Video Note  I connected with Nathan Meyer on 05/17/23 at  3:20 PM EDT by a video enabled telemedicine application and verified that I am speaking with the correct person using two identifiers.  Location Provider Location : [facility] Patient Location : Home  Participants: Patient , Provider   I discussed the limitations of evaluation and management by telemedicine and the availability of in person appointments. The patient expressed understanding and agreed to proceed.   I discussed the assessment and treatment plan with the patient. The patient was provided an opportunity to ask questions and all were answered. The patient agreed with the plan and demonstrated an understanding of the instructions.   The patient was advised to call back or seek an in-person evaluation if the symptoms worsen or if the condition fails to improve as anticipated. Video connection was lost at less than 50% of the duration of the visit, at which time the remainder of the visit was completed through audio only    [facility] MD OP Progress Note  05/17/2023 3:36 PM DONSHAY LUPINSKI  MRN:  [id number]  Chief Complaint:  Chief Complaint  Patient presents with   Follow-up   Anxiety   Medication Refill   Depression   HPI: [name] is a 63 year old Caucasian male, lives with girlfriend in [place], has a history of MDD, GAD, insomnia, chronic pain was evaluated for a follow-up appointment.  He is currently on duloxetine 60 mg daily, which was increased from 30 mg in March. He recently tapered off fluoxetine, stopping it completely two days ago. He feels more awake with duloxetine compared to fluoxetine and is considering taking the full dose in the morning to see if it improves his sleep.  He has been experiencing symptoms of COVID-19 for the past two weeks, with a positive test on March 25th. Symptoms include a mild cough, sweating, and low-grade fever. He has been managing symptoms with  Coricidin, acetaminophen, and oxycodone. He reports improvement in symptoms and plans to test again soon. No severe respiratory symptoms reported.  He is currently taking alprazolam 0.5 mg, reduced to two tablets daily since his last visit. He reports poor sleep, which he attributes to COVID-19 . He has experienced sweating, likely due to the viral infection. He has had a low-grade fever intermittently during his illness. He is also on trazodone for sleep.  He is on oxycodone for pain management and was referred to pain management in [place] but has not yet contacted them due to recent illness.  He currently denies any suicidality, homicidality or perceptual disturbances.   Visit Diagnosis:    ICD-10-CM   1. MDD (major depressive disorder), recurrent episode, moderate (HCC)  F33.1     2. GAD (generalized anxiety disorder)  F41.1     3. Insomnia, unspecified type  G47.00     4. Benzodiazepine dependence (HCC)  F13.20       Past Psychiatric History: I have reviewed past psychiatric history from progress note on 03/20/2023.  Past trials of Zoloft, fluoxetine  Past Medical History:  Past Medical History:  Diagnosis Date   Anxiety    Arthritis    Cyst, kidney, acquired    Depression    GERD (gastroesophageal reflux disease)    Hyperlipidemia    Neuromuscular disorder (HCC)    Spine degeneration    Substance abuse (HCC)     Past Surgical History:  Procedure Laterality Date   ANTERIOR FUSION CERVICAL SPINE  1995  finger debridement      Family Psychiatric History: I have reviewed family psychiatric history from progress note on 03/20/2023  Family History:  Family History  Problem Relation Age of Onset   Arthritis Mother    Hypertension Mother    Hyperlipidemia Mother    Anxiety disorder Mother    Depression Father    COPD Father    Alcohol abuse Maternal Uncle    Alcohol abuse Paternal Uncle    Alcohol abuse Paternal Grandfather    Alcohol abuse Cousin     Social  History: I have social history from progress note on 03/20/2023 Social History   Socioeconomic History   Marital status: Divorced    Spouse name: Not on file   Number of children: 0   Years of education: Not on file   Highest education level: High school graduate  Occupational History   Not on file  Tobacco Use   Smoking status: Former    Current packs/day: 0.00    Average packs/day: 1 pack/day for 38.3 years (38.3 ttl pk-yrs)    Types: Cigarettes    Start date: 33    Quit date: 05/29/2021    Years since quitting: 1.9   Smokeless tobacco: Never  Vaping Use   Vaping status: Never Used  Substance and Sexual Activity   Alcohol use: No   Drug use: Not Currently    Comment: former substance abuser - cocaine, pills, marijuana   Sexual activity: Not Currently  Other Topics Concern   Not on file  Social History Narrative   Not on file   Social Drivers of Health   Financial Resource Strain: Not on file  Food Insecurity: Not on file  Transportation Needs: Not on file  Physical Activity: Not on file  Stress: Not on file  Social Connections: Not on file    Allergies:  Allergies  Allergen Reactions   Nsaids Nausea And Vomiting and Anaphylaxis    Only in very high doses   Prednisone Anaphylaxis   Tolmetin Nausea And Vomiting    Only in very high doses    Metabolic Disorder Labs: Lab Results  Component Value Date   HGBA1C 5.6 12/15/2022   No results found for: "PROLACTIN" Lab Results  Component Value Date   CHOL 132 12/15/2022   TRIG 118 12/15/2022   HDL 36 (L) 12/15/2022   CHOLHDL 3.7 12/15/2022   LDLCALC 75 12/15/2022   LDLCALC 91 06/16/2022   Lab Results  Component Value Date   TSH 1.860 05/17/2021    Therapeutic Level Labs: No results found for: "LITHIUM" No results found for: "VALPROATE" No results found for: "CBMZ"  Current Medications: Current Outpatient Medications  Medication Sig Dispense Refill   ALPRAZolam (XANAX) 0.5 MG tablet Take 2 tablets  (1 mg total) by mouth as directed for 20 days. Take ONE tablet daily at 9 AM , HALF tablet daily at 1 PM and HALF tablet daily at 8 PM 40 tablet 0   atorvastatin (LIPITOR) 20 MG tablet Take 1 tablet (20 mg total) by mouth at bedtime. 90 tablet 3   Cholecalciferol (VITAMIN D3 PO) Take by mouth.     Cyanocobalamin (B-12 PO) Take by mouth.     DULoxetine (CYMBALTA) 30 MG capsule Take 1 capsule (30 mg total) by mouth 2 (two) times daily. Dose change 60 capsule 1   FLUoxetine (PROZAC) 20 MG capsule Take 1 capsule (20 mg total) by mouth daily for 15 days. Wants to hold off sending this to pharmacy  per patient     hydrochlorothiazide (MICROZIDE) 12.5 MG capsule Take 1 capsule (12.5 mg total) by mouth every morning. 90 capsule 3   irbesartan (AVAPRO) 75 MG tablet Take 1 tablet (75 mg total) by mouth daily. 90 tablet 3   lansoprazole (PREVACID) 15 MG capsule TAKE 1 CAPSULE BY MOUTH ONCE DAILY 90 capsule 3   Magnesium Gluconate 550 MG TABS Take by mouth.     Multiple Vitamin (MULTIVITAMIN ADULT PO) Take by mouth.     oxyCODONE-acetaminophen (PERCOCET) 7.5-325 MG tablet Take 1 tablet by mouth every 8 (eight) hours as needed for severe pain (pain score 7-10). 90 tablet 0   SYRINGE-NEEDLE, DISP, 3 ML (B-D SYRINGE/NEEDLE 3CC/23GX1") 23G X 1" 3 ML MISC Use 1 syringe with needle every 21 days; To be used for IM testosterone injection. 50 each 0   testosterone cypionate (DEPOTESTOSTERONE CYPIONATE) 200 MG/ML injection Inject 0.5 mLs (100 mg total) into the muscle every 21 days. 10 mL 0   tiZANidine (ZANAFLEX) 2 MG tablet TAKE 1 TABLET BY MOUTH EVERY 6 HOURS AS NEEDED FOR MUSCLE SPASM **NO MORE THAN 4 TABS IN 24 HOURS** 120 tablet 1   traZODone (DESYREL) 100 MG tablet Take 1-2 tablets (100-200 mg total) by mouth at bedtime. Takes (2) 100mg  tabs every night 180 tablet 3   No current facility-administered medications for this visit.     Musculoskeletal: Strength & Muscle Tone:  UTA Gait & Station:   Seated Patient leans: N/A  Psychiatric Specialty Exam: Review of Systems  Psychiatric/Behavioral:  Positive for decreased concentration and sleep disturbance. The patient is nervous/anxious.     There were no vitals taken for this visit.There is no height or weight on file to calculate BMI.  General Appearance: Casual  Eye Contact:  Fair  Speech:  Clear and Coherent  Volume:  Normal  Mood:  Anxious and Depressed  Affect:  Congruent  Thought Process:  Goal Directed and Descriptions of Associations: Intact  Orientation:  Full (Time, Place, and Person)  Thought Content: Logical   Suicidal Thoughts:  No  Homicidal Thoughts:  No  Memory:  Immediate;   Fair Recent;   Fair Remote;   Limited  Judgement:  Fair  Insight:  Fair  Psychomotor Activity:  Normal  Concentration:  Concentration: Fair and Attention Span: Fair  Recall:  [employer] of Knowledge: Fair  Language: Fair  Akathisia:  No  Handed:  Right  AIMS (if indicated): not done  Assets:  Desire for Improvement Housing Social Support Transportation  ADL's:  Intact  Cognition: WNL  Sleep:   varies   Screenings: GAD-7    Loss adjuster, chartered Office Visit from 04/27/2023 in [facility] Office Visit from 03/20/2023 in [facility]  Total GAD-7 Score 13 13      Mini-Mental    Flowsheet Row Office Visit from 06/14/2022 in [facility], [facility] Office Visit from 06/09/2021 in [facility], [facility]  Total Score (max 30 points ) 30 30      PHQ2-9    Flowsheet Row Office Visit from 04/27/2023 in [facility] Office Visit from 03/20/2023 in [facility] Office Visit from 03/07/2022 in [facility], [facility] Office Visit from 11/08/2021 in [facility] Office Visit from 08/11/2021 in [facility], [facility]  PHQ-2 Total Score 4 0 6 6 0   PHQ-9 Total Score 17 14 18 16  --  Flowsheet Row Office Visit from 04/27/2023 in [facility] Office Visit from 03/20/2023 in [facility]  C-SSRS RISK CATEGORY No Risk Low Risk        Assessment and Plan: TORRIAN CANION is a 63 year old Caucasian male with history of depression, anxiety, chronic pain was evaluated for a follow-up appointment.  Discussed assessment and plan as noted below.  Generalized anxiety disorder-improving Tapering off alprazolam with a reduction plan. Currently taking 0.5 mg in the morning, half at lunch, and half in the evening. Plan to reduce morning dose to half tablet. Tapering may reduce dependency and improve anxiety management. - Reduce Alprazolam to 0.25 mg at 9 AM, half tablet at 1 PM, and half tablet at 8 PM. - Monitor for withdrawal symptoms and anxiety levels. - Reviewed [place] PMP AWARxE - Continue Duloxetine 60 mg daily, he could take the whole 60 mg in the morning.  Major depressive disorder-improving Currently on duloxetine 60 mg daily, increased in March 2025. Reports feeling more awake on duloxetine compared to previous fluoxetine regimen. No dosage changes due to concurrent COVID-19 infection. Advised to take entire dose in the morning to assess impact on sleep. - Continue Duloxetine 60 mg daily, with option to take entire dose in the morning to assess impact on sleep.  Insomnia-unstable Currently sleep problems due to COVID-19 infection. - Continue Trazodone 100-200 mg at bedtime as needed for sleep - Patient will also need sufficient pain management.  Benzodiazepine dependence-prescribed-improving Currently being weaned off of benzodiazepine. - Will continue to taper of Xanax as noted above.  Follow-up - Follow-up in clinic in 3 to 4 weeks or sooner if needed.   I have spent at least 20 minutes non face to face with patient today.    Consent:  Patient/Guardian gives verbal consent for treatment and assignment of benefits for services provided during this visit. Patient/Guardian expressed understanding and agreed to proceed.  Discussed the use of a AI scribe software for clinical note transcription with the patient, who gave verbal consent to proceed.  This note was generated in part or whole with voice recognition software. Voice recognition is usually quite accurate but there are transcription errors that can and very often do occur. I apologize for any typographical errors that were not detected and corrected.     Jomarie Longs, MD 05/17/2023, 3:36 PM

## 2023-05-26 ENCOUNTER — Other Ambulatory Visit: Payer: Self-pay | Admitting: Nurse Practitioner

## 2023-05-26 DIAGNOSIS — M47816 Spondylosis without myelopathy or radiculopathy, lumbar region: Secondary | ICD-10-CM

## 2023-05-26 DIAGNOSIS — G8929 Other chronic pain: Secondary | ICD-10-CM

## 2023-05-26 NOTE — Telephone Encounter (Signed)
 Last 3/25and next 5/25

## 2023-05-26 NOTE — Telephone Encounter (Signed)
 Spoke with pt he still don't have appt yet with pain management

## 2023-05-30 ENCOUNTER — Telehealth: Payer: Self-pay

## 2023-05-30 NOTE — Telephone Encounter (Signed)
 Left message for patient to give office a call back.

## 2023-05-30 NOTE — Telephone Encounter (Signed)
 Patient notified

## 2023-06-05 ENCOUNTER — Telehealth: Payer: Self-pay

## 2023-06-05 NOTE — Telephone Encounter (Signed)
 pt states he is having a side effect on the cymbalta . he states he is getting sore all over his body, his nose, legs lip. pt was last seen on 4-2 next apt 5-6

## 2023-06-05 NOTE — Telephone Encounter (Signed)
 pt was notified of instruction per dr. Avanell Bob order. pt states he had a dermo appt wednesday. willl call him on thurs or friday see how appt went.

## 2023-06-05 NOTE — Telephone Encounter (Signed)
 Tell him to cut it down to once a day for 3-4 days then d/c

## 2023-06-07 ENCOUNTER — Ambulatory Visit: Payer: PPO | Admitting: Dermatology

## 2023-06-07 ENCOUNTER — Encounter: Payer: Self-pay | Admitting: Dermatology

## 2023-06-07 DIAGNOSIS — Z79899 Other long term (current) drug therapy: Secondary | ICD-10-CM

## 2023-06-07 DIAGNOSIS — Z1283 Encounter for screening for malignant neoplasm of skin: Secondary | ICD-10-CM | POA: Diagnosis not present

## 2023-06-07 DIAGNOSIS — L719 Rosacea, unspecified: Secondary | ICD-10-CM | POA: Diagnosis not present

## 2023-06-07 DIAGNOSIS — Z7189 Other specified counseling: Secondary | ICD-10-CM

## 2023-06-07 DIAGNOSIS — Z872 Personal history of diseases of the skin and subcutaneous tissue: Secondary | ICD-10-CM | POA: Diagnosis not present

## 2023-06-07 DIAGNOSIS — L720 Epidermal cyst: Secondary | ICD-10-CM | POA: Diagnosis not present

## 2023-06-07 DIAGNOSIS — L219 Seborrheic dermatitis, unspecified: Secondary | ICD-10-CM | POA: Diagnosis not present

## 2023-06-07 DIAGNOSIS — L738 Other specified follicular disorders: Secondary | ICD-10-CM | POA: Diagnosis not present

## 2023-06-07 DIAGNOSIS — D229 Melanocytic nevi, unspecified: Secondary | ICD-10-CM

## 2023-06-07 DIAGNOSIS — W908XXA Exposure to other nonionizing radiation, initial encounter: Secondary | ICD-10-CM

## 2023-06-07 DIAGNOSIS — L729 Follicular cyst of the skin and subcutaneous tissue, unspecified: Secondary | ICD-10-CM

## 2023-06-07 DIAGNOSIS — L821 Other seborrheic keratosis: Secondary | ICD-10-CM

## 2023-06-07 DIAGNOSIS — L578 Other skin changes due to chronic exposure to nonionizing radiation: Secondary | ICD-10-CM

## 2023-06-07 DIAGNOSIS — D1801 Hemangioma of skin and subcutaneous tissue: Secondary | ICD-10-CM | POA: Diagnosis not present

## 2023-06-07 DIAGNOSIS — L814 Other melanin hyperpigmentation: Secondary | ICD-10-CM

## 2023-06-07 MED ORDER — METRONIDAZOLE 0.75 % EX CREA
TOPICAL_CREAM | CUTANEOUS | 11 refills | Status: AC
Start: 2023-06-07 — End: ?

## 2023-06-07 MED ORDER — KETOCONAZOLE 2 % EX SHAM: MEDICATED_SHAMPOO | CUTANEOUS | 11 refills | Status: AC

## 2023-06-07 NOTE — Progress Notes (Signed)
 Follow-Up Visit   Subjective  Nathan Meyer is a 63 y.o. male who presents for the following: Skin Cancer Screening and Full Body Skin Exam Hx  hx of isks, hx of seb derm, hx of epidermal cyst  The patient presents for Total-Body Skin Exam (TBSE) for skin cancer screening and mole check. The patient has spots, moles and lesions to be evaluated, some may be new or changing and the patient may have concern these could be cancer.  The following portions of the chart were reviewed this encounter and updated as appropriate: medications, allergies, medical history  Review of Systems:  No other skin or systemic complaints except as noted in HPI or Assessment and Plan.  Objective  Well appearing patient in no apparent distress; mood and affect are within normal limits.  A full examination was performed including scalp, head, eyes, ears, nose, lips, neck, chest, axillae, abdomen, back, buttocks, bilateral upper extremities, bilateral lower extremities, hands, feet, fingers, toes, fingernails, and toenails. All findings within normal limits unless otherwise noted below.   Relevant physical exam findings are noted in the Assessment and Plan.   Assessment & Plan   SKIN CANCER SCREENING PERFORMED TODAY.  ACTINIC DAMAGE - Chronic condition, secondary to cumulative UV/sun exposure - diffuse scaly erythematous macules with underlying dyspigmentation - Recommend daily broad spectrum sunscreen SPF 30+ to sun-exposed areas, reapply every 2 hours as needed.  - Staying in the shade or wearing long sleeves, sun glasses (UVA+UVB protection) and wide brim hats (4-inch brim around the entire circumference of the hat) are also recommended for sun protection.  - Call for new or changing lesions.  LENTIGINES, SEBORRHEIC KERATOSES, HEMANGIOMAS - Benign normal skin lesions - Benign-appearing - Call for any changes  MELANOCYTIC NEVI - Tan-brown and/or pink-flesh-colored symmetric macules and papules -  Benign appearing on exam today - Observation - Call clinic for new or changing moles - Recommend daily use of broad spectrum spf 30+ sunscreen to sun-exposed areas.   Seborrheic dermatitis Scalp Exam: mild scale scalp and brows Chronic and persistent condition with duration or expected duration over one year. Condition is symptomatic / bothersome to patient. Not to goal. Seborrheic Dermatitis  -  is a chronic persistent rash characterized by pinkness and scaling most commonly of the mid face but also can occur on the scalp (dandruff), ears; mid chest, mid back and groin.  It tends to be exacerbated by stress and cooler weather.  People who have neurologic disease may experience new onset or exacerbation of existing seborrheic dermatitis.  The condition is not curable but treatable and can be controlled. 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 Call for changes. - discussed ED treatment for large or symptomatic lesions  ROSACEA Exam Mid face erythema with telangiectasias and scattered inflammatory papules at nose  Chronic and persistent condition with duration or expected duration over one year. Condition is bothersome/symptomatic for patient. Currently flared. Patient has been picking at bumps at nose  Rosacea is a chronic progressive skin condition usually affecting the face of adults, causing redness and/or acne bumps. It is treatable but not curable. It sometimes affects the eyes (ocular rosacea) as well. It may respond to topical and/or systemic medication and can flare with stress, sun exposure, alcohol, exercise, topical steroids (including hydrocortisone/cortisone 10) and some foods.  Daily  application of broad spectrum spf 30+ sunscreen to face is recommended to reduce flares.  Patient denies  grittiness of the eyes   Treatment Plan Start metronidazole  0.75 % cream apply topically one to twice daily   EPIDERMAL INCLUSION CYST Exam:  0.8 cm subcutaneous nodule at right lateral posterior base of neck and subcutaneous nodule at right temple  Cyst with symptoms and/or recent change.  Discussed surgical excision to remove, including resulting scar and possible recurrence.  Patient will schedule for surgery. Pre-op information given. Discussed no longer do surgery due to neck surgery. Will refer to Dr. Annette Barters or can refer to Dr. Paci for further treatment  ROSACEA   Related Medications metroNIDAZOLE  (METROCREAM ) 0.75 % cream Apply twice daily to affected areas of face SEBORRHEIC DERMATITIS   Related Medications ketoconazole  (NIZORAL ) 2 % shampoo apply three times per week, massage into scalp and leave in for at least 5 minutes before rinsing out Return in about 1 year (around 06/06/2024) for TBSE.  IRandee Busing, CMA, am acting as scribe for Celine Collard, MD.   Documentation: I have reviewed the above documentation for accuracy and completeness, and I agree with the above.  Celine Collard, MD

## 2023-06-07 NOTE — Patient Instructions (Addendum)
 Pre-Operative Instructions  You are scheduled for a surgical procedure at [facility]. We recommend you read the following instructions. If you have any questions or concerns, please call the office at [phone].  Shower and wash the entire body with soap and water the day of your surgery paying special attention to cleansing at and around the planned surgery site.  Avoid aspirin or aspirin containing products at least fourteen (14) days prior to your surgical procedure and for at least one week (7 Days) after your surgical procedure. If you take aspirin on a regular basis for heart disease or history of stroke or for any other reason, we may recommend you continue taking aspirin but please notify us  if you take this on a regular basis. Aspirin can cause more bleeding to occur during surgery as well as prolonged bleeding and bruising after surgery.   Avoid other nonsteroidal pain medications at least one week prior to surgery and at least one week prior to your surgery. These include medications such as Ibuprofen (Motrin, Advil and Nuprin), Naprosyn, Voltaren, Relafen, etc. If medications are used for therapeutic reasons, please inform us  as they can cause increased bleeding or prolonged bleeding during and bruising after surgical procedures.   Please advise us  if you are taking any "blood thinner" medications such as Coumadin or Dipyridamole or Plavix or similar medications. These cause increased bleeding and prolonged bleeding during procedures and bruising after surgical procedures. We may have to consider discontinuing these medications briefly prior to and shortly after your surgery if safe to do so.   Please inform us  of all medications you are currently taking. All medications that are taken regularly should be taken the day of surgery as you always do. Nevertheless, we need to be informed of what medications you are taking prior to surgery to know whether they will affect the  procedure or cause any complications.   Please inform us  of any medication allergies. Also inform us  of whether you have allergies to Latex or rubber products or whether you have had any adverse reaction to Lidocaine or Epinephrine.  Please inform us  of any prosthetic or artificial body parts such as artificial heart valve, joint replacements, etc., or similar condition that might require preoperative antibiotics.   We recommend avoidance of alcohol at least two weeks prior to surgery and continued avoidance for at least two weeks after surgery.   We recommend discontinuation of tobacco smoking at least two weeks prior to surgery and continued abstinence for at least two weeks after surgery.  Do not plan strenuous exercise, strenuous work or strenuous lifting for approximately four weeks after your surgery.   We request if you are unable to make your scheduled surgical appointment, please call us  at least a week in advance or as soon as you are aware of a problem so that we can cancel or reschedule the appointment.   You MAY TAKE TYLENOL  (acetaminophen ) for pain as it is not a blood thinner.   PLEASE PLAN TO BE IN TOWN FOR TWO WEEKS FOLLOWING SURGERY, THIS IS IMPORTANT SO YOU CAN BE CHECKED FOR DRESSING CHANGES, SUTURE REMOVAL AND TO MONITOR FOR POSSIBLE COMPLICATIONS.    Rosacea  What is rosacea? Rosacea (say: ro-zay-sha) is a common skin disease that usually begins as a trend of flushing or blushing easily.  As rosacea progresses, a persistent redness in the center of the face will develop and may gradually spread beyond the nose and cheeks to the forehead and  chin.  In some cases, the ears, chest, and back could be affected.  Rosacea may appear as tiny blood vessels or small red bumps that occur in crops.  Frequently they can contain pus, and are called "pustules".  If the bumps do not contain pus, they are referred to as "papules".  Rarely, in prolonged, untreated cases of rosacea, the oil  glands of the nose and cheeks may become permanently enlarged.  This is called rhinophyma, and is seen more frequently in men.  Signs and Risks In its beginning stages, rosacea tends to come and go, which makes it difficult to recognize.  It can start as intermittent flushing of the face.  Eventually, blood vessels may become permanently visible.  Pustules and papules can appear, but can be mistaken for adult acne.  People of all races, ages, genders and ethnic groups are at risk of developing rosacea.  However, it is more common in women (especially around menopause) and adults with fair skin between the ages of 15 and 56.  Treatment Dermatologists typically recommend a combination of treatments to effectively manage rosacea.  Treatment can improve symptoms and may stop the progression of the rosacea.  Treatment may involve both topical and oral medications.  The tetracycline antibiotics are often used for their anti-inflammatory effect; however, because of the possibility of developing antibiotic resistance, they should not be used long term at full dose.  For dilated blood vessels the options include electrodessication (uses electric current through a small needle), laser treatment, and cosmetics to hide the redness.   With all forms of treatment, improvement is a slow process, and patients may not see any results for the first 3-4 weeks.  It is very important to avoid the sun and other triggers.  Patients must wear sunscreen daily.  Skin Care Instructions: Cleanse the skin with a mild soap such as CeraVe cleanser, Cetaphil cleanser, or Dove soap once or twice daily as needed. Moisturize with Eucerin Redness Relief Daily Perfecting Lotion (has a subtle green tint), CeraVe Moisturizing Cream, or Oil of Olay Daily Moisturizer with sunscreen every morning and/or night as recommended. Makeup should be "non-comedogenic" (won't clog pores) and be labeled "for sensitive skin". Good choices for cosmetics are:  Neutrogena, Almay, and Physician's Formula.  Any product with a green tint tends to offset a red complexion. If your eyes are dry and irritated, use artificial tears 2-3 times per day and cleanse the eyelids daily with baby shampoo.  Have your eyes examined at least every 2 years.  Be sure to tell your eye doctor that you have rosacea. Alcoholic beverages tend to cause flushing of the skin, and may make rosacea worse. Always wear sunscreen, protect your skin from extreme hot and cold temperatures, and avoid spicy foods, hot drinks, and mechanical irritation such as rubbing, scrubbing, or massaging the face.  Avoid harsh skin cleansers, cleansing masks, astringents, and exfoliation. If a particular product burns or makes your face feel tight, then it is likely to flare your rosacea. If you are having difficulty finding a sunscreen that you can tolerate, you may try switching to a chemical-free sunscreen.  These are ones whose active ingredient is zinc oxide or titanium dioxide only.  They should also be fragrance free, non-comedogenic, and labeled for sensitive skin. Rosacea triggers may vary from person to person.  There are a variety of foods that have been reported to trigger rosacea.  Some patients find that keeping a diary of what they were doing when they  flared helps them avoid triggers.    Melanoma ABCDEs  Melanoma is the most dangerous type of skin cancer, and is the leading cause of death from skin disease.  You are more likely to develop melanoma if you: Have light-colored skin, light-colored eyes, or red or blond hair Spend a lot of time in the sun Tan regularly, either outdoors or in a tanning bed Have had blistering sunburns, especially during childhood Have a close family member who has had a melanoma Have atypical moles or large birthmarks  Early detection of melanoma is key since treatment is typically straightforward and cure rates are extremely high if we catch it early.   The  first sign of melanoma is often a change in a mole or a new dark spot.  The ABCDE system is a way of remembering the signs of melanoma.  A for asymmetry:  The two halves do not match. B for border:  The edges of the growth are irregular. C for color:  A mixture of colors are present instead of an even brown color. D for diameter:  Melanomas are usually (but not always) greater than 6mm - the size of a pencil eraser. E for evolution:  The spot keeps changing in size, shape, and color.  Please check your skin once per month between visits. You can use a small mirror in front and a large mirror behind you to keep an eye on the back side or your body.   If you see any new or changing lesions before your next follow-up, please call to schedule a visit.  Please continue daily skin protection including broad spectrum sunscreen SPF 30+ to sun-exposed areas, reapplying every 2 hours as needed when you're outdoors.   Staying in the shade or wearing long sleeves, sun glasses (UVA+UVB protection) and wide brim hats (4-inch brim around the entire circumference of the hat) are also recommended for sun protection.    Due to recent changes in healthcare laws, you may see results of your pathology and/or laboratory studies on MyChart before the doctors have had a chance to review them. We understand that in some cases there may be results that are confusing or concerning to you. Please understand that not all results are received at the same time and often the doctors may need to interpret multiple results in order to provide you with the best plan of care or course of treatment. Therefore, we ask that you please give us  2 business days to thoroughly review all your results before contacting the office for clarification. Should we see a critical lab result, you will be contacted sooner.   If You Need Anything After Your Visit  If you have any questions or concerns for your doctor, please call our main line at  [phone] and press option 4 to reach your doctor's medical assistant. If no one answers, please leave a voicemail as directed and we will return your call as soon as possible. Messages left after 4 pm will be answered the following business day.   You may also send us  a message via MyChart. We typically respond to MyChart messages within 1-2 business days.  For prescription refills, please ask your pharmacy to contact our office. Our fax number is [phone].  If you have an urgent issue when the clinic is closed that cannot wait until the next business day, you can page your doctor at the number below.    Please note that while we do our best to be  available for urgent issues outside of office hours, we are not available 24/7.   If you have an urgent issue and are unable to reach us , you may choose to seek medical care at your doctor's office, retail clinic, urgent care center, or emergency room.  If you have a medical emergency, please immediately call 911 or go to the emergency department.  Pager Numbers  - Dr. Bary Likes: [phone]  - Dr. Annette Barters: [phone]  - Dr. Felipe Horton: [phone]   In the event of inclement weather, please call our main line at [phone] for an update on the status of any delays or closures.  Dermatology Medication Tips: Please keep the boxes that topical medications come in in order to help keep track of the instructions about where and how to use these. Pharmacies typically print the medication instructions only on the boxes and not directly on the medication tubes.   If your medication is too expensive, please contact our office at [phone] option 4 or send us  a message through MyChart.   We are unable to tell what your co-pay for medications will be in advance as this is different depending on your insurance coverage. However, we may be able to find a substitute medication at lower cost or fill out paperwork to get insurance to cover a needed  medication.   If a prior authorization is required to get your medication covered by your insurance company, please allow us  1-2 business days to complete this process.  Drug prices often vary depending on where the prescription is filled and some pharmacies may offer cheaper prices.  The website www.goodrx.com contains coupons for medications through different pharmacies. The prices here do not account for what the cost may be with help from insurance (it may be cheaper with your insurance), but the website can give you the price if you did not use any insurance.  - You can print the associated coupon and take it with your prescription to the pharmacy.  - You may also stop by our office during regular business hours and pick up a GoodRx coupon card.  - If you need your prescription sent electronically to a different pharmacy, notify our office through [facility] or by phone at [phone] option 4.     Si Usted Necesita Algo Despus de Su Visita  Tambin puede enviarnos un mensaje a travs de Clinical cytogeneticist. Por lo general respondemos a los mensajes de MyChart en el transcurso de 1 a 2 das hbiles.  Para renovar recetas, por favor pida a su farmacia que se ponga en contacto con nuestra oficina. Franz Jacks de fax es [place] [phone].  Si tiene un asunto urgente cuando la clnica est cerrada y que no puede esperar hasta el siguiente da hbil, puede llamar/localizar a su doctor(a) al nmero que aparece a continuacin.   Por favor, tenga en cuenta que aunque hacemos todo lo posible para estar disponibles para asuntos urgentes fuera del horario de [place], no estamos disponibles las 24 horas del da, los 7 [address]  [address] de la [place].   Si tiene un problema urgente y no puede comunicarse con nosotros, puede optar por buscar atencin mdica  en el consultorio de su doctor(a), en una clnica privada, en un centro de atencin urgente o en una sala de emergencias.  Si tiene Engineer, drilling,  por favor llame inmediatamente al 911 o vaya a la sala de emergencias.  Nmeros de bper  - Dr. Bary Likes: [phone]  - Dra. Annette Barters: [phone]  - Dr.  Smith: [phone]   En caso de inclemencias del [place], por favor llame a Lajuan Pila principal al [phone] para una actualizacin sobre el [place] de cualquier retraso o cierre.  Consejos para la medicacin en dermatologa: Por favor, guarde las cajas en las que vienen los medicamentos de uso tpico para ayudarle a seguir las instrucciones sobre dnde y cmo usarlos. Las farmacias generalmente imprimen las instrucciones del medicamento slo en las cajas y no directamente en los tubos del [place].   Si su medicamento es muy caro, por favor, pngase en contacto con Bettyjane Brunet llamando al [phone] y presione la opcin 4 o envenos un mensaje a travs de Clinical cytogeneticist.   No podemos decirle cul ser su copago por los medicamentos por adelantado ya que esto es diferente dependiendo de la cobertura de su seguro. Sin embargo, es posible que podamos encontrar un medicamento sustituto a Audiological scientist un formulario para que el seguro cubra el medicamento que se considera necesario.   Si se requiere una autorizacin previa para que su compaa de seguros [place] su medicamento, por favor permtanos de 1 a 2 das hbiles para completar este proceso.  Los precios de los medicamentos varan con frecuencia dependiendo del Environmental consultant de dnde se surte la receta y alguna farmacias pueden ofrecer precios ms baratos.  El sitio web www.goodrx.com tiene cupones para medicamentos de Health and safety inspector. Los precios aqu no tienen en cuenta lo que podra costar con la ayuda del seguro (puede ser ms barato con su seguro), pero el sitio web puede darle el precio si no utiliz Tourist information centre manager.  - Puede imprimir el cupn correspondiente y llevarlo con su receta a la farmacia.  - Tambin puede pasar por nuestra oficina durante el horario de atencin  regular y Education officer, museum una tarjeta de cupones de GoodRx.  - Si necesita que su receta se enve electrnicamente a una farmacia diferente, informe a nuestra oficina a travs de MyChart de [place] o por telfono llamando al [phone] y presione la opcin 4.

## 2023-06-09 NOTE — Telephone Encounter (Signed)
 pt states that he went to the dermatologist and that he was told that the bumps were rosacea and they gave him a cream. pt states that he doesn't want to go back on the cymbalta . and that the trazodone  doesn't seem to be working it gives him dreams. he has appt 5-1 with his pcp and then he has appt with you on 5-6.  He states he will speak with you about these issues at his appt.

## 2023-06-09 NOTE — Telephone Encounter (Signed)
 Will forward this Dr. Eappen

## 2023-06-09 NOTE — Telephone Encounter (Signed)
 Please let patient know to go back on Cymbalta  , his previous dose of 60 mg daily. I do not believe he needs a refill at this time and he is scheduled to see me before he will be due for it.

## 2023-06-15 ENCOUNTER — Encounter: Payer: Self-pay | Admitting: Nurse Practitioner

## 2023-06-15 ENCOUNTER — Ambulatory Visit: Payer: PPO | Admitting: Nurse Practitioner

## 2023-06-15 VITALS — BP 130/70 | HR 81 | Temp 98.4°F | Resp 16 | Ht 72.0 in | Wt 213.8 lb

## 2023-06-15 DIAGNOSIS — M5442 Lumbago with sciatica, left side: Secondary | ICD-10-CM | POA: Diagnosis not present

## 2023-06-15 DIAGNOSIS — E291 Testicular hypofunction: Secondary | ICD-10-CM

## 2023-06-15 DIAGNOSIS — Z0000 Encounter for general adult medical examination without abnormal findings: Secondary | ICD-10-CM

## 2023-06-15 DIAGNOSIS — G8929 Other chronic pain: Secondary | ICD-10-CM

## 2023-06-15 DIAGNOSIS — Z87891 Personal history of nicotine dependence: Secondary | ICD-10-CM | POA: Diagnosis not present

## 2023-06-15 DIAGNOSIS — M5441 Lumbago with sciatica, right side: Secondary | ICD-10-CM | POA: Diagnosis not present

## 2023-06-15 DIAGNOSIS — M47816 Spondylosis without myelopathy or radiculopathy, lumbar region: Secondary | ICD-10-CM | POA: Diagnosis not present

## 2023-06-15 DIAGNOSIS — F1120 Opioid dependence, uncomplicated: Secondary | ICD-10-CM | POA: Diagnosis not present

## 2023-06-15 MED ORDER — OXYCODONE-ACETAMINOPHEN 7.5-325 MG PO TABS: 1.0000 | ORAL_TABLET | Freq: Three times a day (TID) | ORAL | 0 refills | Status: DC | PRN

## 2023-06-15 NOTE — Progress Notes (Signed)
 [facility] [address] [place], [place] [address]  Internal MEDICINE  Office Visit Note  Patient Name: Nathan Meyer  [id number]  [id number]  Date of Service: 06/15/2023  Chief Complaint  Patient presents with   Depression   Gastroesophageal Reflux   Hyperlipidemia   Medicare Wellness    HPI Nathan Meyer presents for a medicare annual wellness visit.  Well-appearing 63 y.o. male with osteoarthritis, GERD, hyperlipidemia, anxiety, hypertension, chronic pain, depression. He lives at home with significant other, he is on disability due to depression and chronic back pain.  Routine CRC screening: due in 2030 Labs: due for routine labs  New or worsening pain: chronic pain Other concerns: His S/O got covid at end of march, then the patient got covid as well. Went to urgent care for this and was not given any antiviral. Symptoms lasted for about 2 weeks. Has little white bumps on face, seen by dermatology, diagnosed with rosacea and this all happened after started on duloxetine  which his psychiatrist had him taper off of and he stopped it 2 weeks ago.  Has been weaning off alprazolam  and is dong better with it than he thought he would     06/15/2023    9:27 AM 06/14/2022    8:57 AM 06/09/2021    9:41 AM  MMSE - Mini Mental State Exam  Orientation to time 5 5 5   Orientation to Place 5 5 5   Registration 3 3 3   Attention/ Calculation 5 5 5   Recall 3 3 3   Language- name 2 objects 2 2 2   Language- repeat 1 1 1   Language- follow 3 step command 3 3 3   Language- read & follow direction 1 1 1   Write a sentence 1 1 1   Copy design 1 1 1   Total score 30 30 30     Functional Status Survey: Is the patient deaf or have difficulty hearing?: No Does the patient have difficulty seeing, even when wearing glasses/contacts?: No Does the patient have difficulty concentrating, remembering, or making decisions?: Yes Does the patient have difficulty walking or climbing stairs?: Yes Does  the patient have difficulty dressing or bathing?: No Does the patient have difficulty doing errands alone such as visiting a doctor's office or shopping?: No     11/08/2021    8:32 AM 12/07/2021    8:32 AM 03/07/2022    8:33 AM 06/14/2022    8:56 AM 06/15/2023    9:25 AM  Fall Risk  Falls in the past year? 0 0 0 0 0  Was there an injury with Fall? 0 0 0 0 0  Fall Risk Category Calculator 0 0 0 0 0  Fall Risk Category (Retired) Low Low     (RETIRED) Patient Fall Risk Level Low fall risk Low fall risk     Patient at Risk for Falls Due to No Fall Risks No Fall Risks No Fall Risks No Fall Risks No Fall Risks  Fall risk Follow up Falls evaluation completed Falls evaluation completed Falls evaluation completed Falls evaluation completed Falls evaluation completed       04/27/2023    1:59 PM  Depression screen PHQ 2/9  Decreased Interest   Down, Depressed, Hopeless   PHQ - 2 Score   Altered sleeping   Tired, decreased energy   Change in appetite   Feeling bad or failure about yourself    Trouble concentrating   Moving slowly or fidgety/restless   Suicidal thoughts   PHQ-9 Score   Difficult  doing work/chores      Information is confidential and restricted. Go to Review Flowsheets to unlock data.       04/27/2023    1:59 PM 03/20/2023    5:58 PM  GAD 7 : Generalized Anxiety Score  Nervous, Anxious, on Edge    Control/stop worrying    Worry too much - different things    Trouble relaxing    Restless    Easily annoyed or irritable    Afraid - awful might happen    Total GAD 7 Score    Anxiety Difficulty       Information is confidential and restricted. Go to Review Flowsheets to unlock data.      Current Medication: Outpatient Encounter Medications as of 06/15/2023  Medication Sig   ALPRAZolam  (XANAX ) 0.5 MG tablet Take 1.5 tablets (0.75 mg total) by mouth as directed. Take HALF tablet daily at 9 AM , HALF tablet daily at 1 PM and HALF tablet daily at 8 PM   atorvastatin   (LIPITOR) 20 MG tablet Take 1 tablet (20 mg total) by mouth at bedtime.   Cholecalciferol (VITAMIN D3 PO) Take by mouth.   Cyanocobalamin  (B-12 PO) Take by mouth.   DULoxetine  (CYMBALTA ) 30 MG capsule Take 1 capsule (30 mg total) by mouth 2 (two) times daily. Dose change   hydrochlorothiazide  (MICROZIDE ) 12.5 MG capsule Take 1 capsule (12.5 mg total) by mouth every morning.   irbesartan  (AVAPRO ) 75 MG tablet Take 1 tablet (75 mg total) by mouth daily.   ketoconazole  (NIZORAL ) 2 % shampoo apply three times per week, massage into scalp and leave in for at least 5 minutes before rinsing out   lansoprazole  (PREVACID ) 15 MG capsule TAKE 1 CAPSULE BY MOUTH ONCE DAILY   Magnesium Gluconate 550 MG TABS Take by mouth.   metroNIDAZOLE  (METROCREAM ) 0.75 % cream Apply twice daily to affected areas of face   Multiple Vitamin (MULTIVITAMIN ADULT PO) Take by mouth.   SYRINGE-NEEDLE, DISP, 3 ML (B-D SYRINGE/NEEDLE 3CC/23GX1") 23G X 1" 3 ML MISC Use 1 syringe with needle every 21 days; To be used for IM testosterone  injection.   testosterone  cypionate (DEPOTESTOSTERONE CYPIONATE) 200 MG/ML injection Inject 0.5 mLs (100 mg total) into the muscle every 21 days.   tiZANidine  (ZANAFLEX ) 2 MG tablet TAKE 1 TABLET BY MOUTH EVERY 6 HOURS AS NEEDED FOR MUSCLE SPASM **NO MORE THAN 4 TABS IN 24 HOURS**   traZODone  (DESYREL ) 100 MG tablet Take 1-2 tablets (100-200 mg total) by mouth at bedtime. Takes (2) 100mg  tabs every night   [DISCONTINUED] oxyCODONE -acetaminophen  (PERCOCET) 7.5-325 MG tablet Take 1 tablet by mouth every 8 (eight) hours as needed for severe pain (pain score 7-10).   oxyCODONE -acetaminophen  (PERCOCET) 7.5-325 MG tablet Take 1 tablet by mouth every 8 (eight) hours as needed for severe pain (pain score 7-10).   No facility-administered encounter medications on file as of 06/15/2023.    Surgical History: Past Surgical History:  Procedure Laterality Date   ANTERIOR FUSION CERVICAL SPINE  1995   finger  debridement      Medical History: Past Medical History:  Diagnosis Date   Anxiety    Arthritis    Cyst, kidney, acquired    Depression    GERD (gastroesophageal reflux disease)    Hyperlipidemia    Neuromuscular disorder (HCC)    Spine degeneration    Substance abuse (HCC)     Family History: Family History  Problem Relation Age of Onset   Arthritis Mother  Hypertension Mother    Hyperlipidemia Mother    Anxiety disorder Mother    Depression Father    COPD Father    Alcohol abuse Maternal Uncle    Alcohol abuse Paternal Uncle    Alcohol abuse Paternal Grandfather    Alcohol abuse Cousin     Social History   Socioeconomic History   Marital status: Divorced    Spouse name: Not on file   Number of children: 0   Years of education: Not on file   Highest education level: High school graduate  Occupational History   Not on file  Tobacco Use   Smoking status: Former    Current packs/day: 0.00    Average packs/day: 1 pack/day for 38.3 years (38.3 ttl pk-yrs)    Types: Cigarettes    Start date: 110    Quit date: 05/29/2021    Years since quitting: 2.0   Smokeless tobacco: Never  Vaping Use   Vaping status: Never Used  Substance and Sexual Activity   Alcohol use: No   Drug use: Not Currently    Comment: former substance abuser - cocaine, pills, marijuana   Sexual activity: Not Currently  Other Topics Concern   Not on file  Social History Narrative   Not on file   Social Drivers of Health   Financial Resource Strain: Not on file  Food Insecurity: Not on file  Transportation Needs: Not on file  Physical Activity: Not on file  Stress: Not on file  Social Connections: Not on file  Intimate Partner Violence: Not on file      Review of Systems  Constitutional:  Positive for fatigue. Negative for activity change, appetite change, chills, fever and unexpected weight change.  HENT: Negative.  Negative for congestion, ear pain, rhinorrhea, sore throat and  trouble swallowing.   Eyes: Negative.   Respiratory: Negative.  Negative for cough, chest tightness, shortness of breath and wheezing.   Cardiovascular: Negative.  Negative for chest pain.  Gastrointestinal: Negative.  Negative for abdominal pain, blood in stool, constipation, diarrhea, nausea and vomiting.  Endocrine: Negative.   Genitourinary: Negative.  Negative for difficulty urinating, dysuria, frequency, hematuria and urgency.  Musculoskeletal:  Positive for arthralgias, back pain, myalgias and neck pain. Negative for joint swelling.  Skin: Negative.  Negative for rash and wound.  Allergic/Immunologic: Negative.  Negative for immunocompromised state.  Neurological: Negative.  Negative for dizziness, seizures, numbness and headaches.  Hematological: Negative.   Psychiatric/Behavioral:  Positive for behavioral problems and sleep disturbance. Negative for self-injury and suicidal ideas. The patient is nervous/anxious.     Vital Signs: BP 130/70   Pulse 81   Temp 98.4 F (36.9 C)   Resp 16   Ht 6' (1.829 m)   Wt 213 lb 12.8 oz (97 kg)   SpO2 94%   BMI 29.00 kg/m    Physical Exam Vitals reviewed.  Constitutional:      General: He is awake. He is not in acute distress.    Appearance: Normal appearance. He is well-developed, well-groomed and overweight. He is not ill-appearing or diaphoretic.  HENT:     Head: Normocephalic and atraumatic.     Right Ear: Tympanic membrane, ear canal and external ear normal.     Left Ear: Tympanic membrane, ear canal and external ear normal.     Nose: Nose normal. No congestion or rhinorrhea.     Mouth/Throat:     Lips: Pink.     Mouth: Mucous membranes are moist.  Pharynx: Oropharynx is clear. Uvula midline. No oropharyngeal exudate or posterior oropharyngeal erythema.  Eyes:     General: Lids are normal. Vision grossly intact. Gaze aligned appropriately. No scleral icterus.       Right eye: No discharge.        Left eye: No discharge.      Extraocular Movements: Extraocular movements intact.     Conjunctiva/sclera: Conjunctivae normal.     Pupils: Pupils are equal, round, and reactive to light.     Funduscopic exam:    Right eye: Red reflex present.        Left eye: Red reflex present. Neck:     Thyroid : No thyromegaly.     Vascular: No JVD.     Trachea: Trachea and phonation normal. No tracheal deviation.  Cardiovascular:     Rate and Rhythm: Normal rate and regular rhythm.     Pulses: Normal pulses.     Heart sounds: Normal heart sounds, S1 normal and S2 normal. No murmur heard.    No friction rub. No gallop.  Pulmonary:     Effort: Pulmonary effort is normal. No accessory muscle usage or respiratory distress.     Breath sounds: Normal breath sounds and air entry. No stridor. No wheezing or rales.  Chest:     Chest wall: No tenderness.  Abdominal:     General: Bowel sounds are normal. There is no distension.     Palpations: Abdomen is soft. There is no mass.     Tenderness: There is no abdominal tenderness. There is no guarding or rebound.  Musculoskeletal:        General: No tenderness or deformity. Normal range of motion.     Cervical back: Normal range of motion and neck supple.     Right lower leg: No edema.     Left lower leg: No edema.  Lymphadenopathy:     Cervical: No cervical adenopathy.  Skin:    General: Skin is warm and dry.     Capillary Refill: Capillary refill takes less than 2 seconds.     Coloration: Skin is not pale.     Findings: No erythema or rash.     Comments: Multiple atypical moles noted on back and chest  Neurological:     Mental Status: He is alert and oriented to person, place, and time.     Cranial Nerves: No cranial nerve deficit.     Motor: No abnormal muscle tone.     Coordination: Coordination normal.     Gait: Gait normal.     Deep Tendon Reflexes: Reflexes are normal and symmetric.  Psychiatric:        Mood and Affect: Mood normal.        Behavior: Behavior normal.  Behavior is cooperative.        Thought Content: Thought content normal.        Judgment: Judgment normal.        Assessment/Plan: 1. Encounter for subsequent annual wellness visit (AWV) in Medicare patient (Primary) Age-appropriate preventive screenings and vaccinations discussed. Routine labs for health maintenance will be ordered. PHM updated.    2. Chronic bilateral low back pain with bilateral sciatica Referred to [facility] Dr. Crecencio Dodge, continue prn oxycodone  as prescribed, will have pain management take over pain medication management.  - Ambulatory referral to Pain Clinic - oxyCODONE -acetaminophen  (PERCOCET) 7.5-325 MG tablet; Take 1 tablet by mouth every 8 (eight) hours as needed for severe pain (pain score 7-10).  Dispense: 90 tablet; Refill: 0  3. Lumbar facet arthropathy Referred to [facility] Dr. Crecencio Dodge, continue prn oxycodone  as prescribed, will have pain management take over pain medication management.  - Ambulatory referral to Pain Clinic - oxyCODONE -acetaminophen  (PERCOCET) 7.5-325 MG tablet; Take 1 tablet by mouth every 8 (eight) hours as needed for severe pain (pain score 7-10).  Dispense: 90 tablet; Refill: 0  4. Hypogonadism in male Continue testosterone  injections as prescribed. Will order labs for routine monitoring.  5. Stopped smoking with greater than 30 pack year history CT chest lung cancer screening ordered  - CT CHEST LUNG CA SCREEN LOW DOSE W/O CM; Future  6. Uncomplicated opioid dependence (HCC) Referred to [facility] Dr. Crecencio Dodge.  - Ambulatory referral to Pain Clinic      General Counseling: Jamesen verbalizes understanding of the findings of todays visit and agrees with plan of treatment. I have discussed any further diagnostic evaluation that may be needed or ordered today. We also reviewed his medications today. he has been encouraged to call the  office with any questions or concerns that should arise related to todays visit.    Orders Placed This Encounter  Procedures   CT CHEST LUNG CA SCREEN LOW DOSE W/O CM   Ambulatory referral to Pain Clinic    Meds ordered this encounter  Medications   oxyCODONE -acetaminophen  (PERCOCET) 7.5-325 MG tablet    Sig: Take 1 tablet by mouth every 8 (eight) hours as needed for severe pain (pain score 7-10).    Dispense:  90 tablet    Refill:  0    For future refill    Return in about 3 months (around 09/15/2023) for F/U, Brinnley Lacap PCP.   Total time spent:30 Minutes Time spent includes review of chart, medications, test results, and follow up plan with the patient.   [place] Controlled Substance Database was reviewed by me.  This patient was seen by Laurence Pons, FNP-C in collaboration with Dr. Verneta Gone as a part of collaborative care agreement.  Beva Remund R. Bobbi Burow, MSN, FNP-C Internal medicine

## 2023-06-16 ENCOUNTER — Telehealth: Payer: Self-pay | Admitting: Nurse Practitioner

## 2023-06-16 NOTE — Telephone Encounter (Signed)
 Pain Management referral faxed to [facility]; [phone]. Notified patient

## 2023-06-20 ENCOUNTER — Ambulatory Visit (INDEPENDENT_AMBULATORY_CARE_PROVIDER_SITE_OTHER): Admitting: Psychiatry

## 2023-06-20 ENCOUNTER — Encounter: Payer: Self-pay | Admitting: Psychiatry

## 2023-06-20 VITALS — BP 110/82 | HR 112 | Temp 98.6°F | Ht 72.0 in | Wt 211.6 lb

## 2023-06-20 DIAGNOSIS — G4700 Insomnia, unspecified: Secondary | ICD-10-CM | POA: Diagnosis not present

## 2023-06-20 DIAGNOSIS — F1320 Sedative, hypnotic or anxiolytic dependence, uncomplicated: Secondary | ICD-10-CM | POA: Diagnosis not present

## 2023-06-20 DIAGNOSIS — F331 Major depressive disorder, recurrent, moderate: Secondary | ICD-10-CM

## 2023-06-20 DIAGNOSIS — F411 Generalized anxiety disorder: Secondary | ICD-10-CM

## 2023-06-20 MED ORDER — ALPRAZOLAM 0.5 MG PO TABS: 0.5000 mg | ORAL_TABLET | ORAL | 0 refills | Status: DC

## 2023-06-20 MED ORDER — DULOXETINE HCL 30 MG PO CPEP: 30.0000 mg | ORAL_CAPSULE | Freq: Two times a day (BID) | ORAL | 1 refills | Status: DC

## 2023-06-20 NOTE — Progress Notes (Unsigned)
 [facility] MD OP Progress Note  06/20/2023 1:33 PM KISHORE WERTHMAN  MRN:  [id number]  Chief Complaint:  Chief Complaint  Patient presents with   Follow-up   Anxiety   Depression   Medication Refill   Discussed the use of AI scribe software for clinical note transcription with the patient, who gave verbal consent to proceed.  History of Present Illness Nathan Meyer is a 63 year old Caucasian male, lives with girlfriend in [place], has a history of MDD, GAD, insomnia, chronic pain was evaluated in office today, presented for a follow-up appointment.  He experiences chronic lower back pain described as 'knives sticking in' with spasms, rated as 7 out of 10. He is currently taking oxycodone , muscle relaxants, and Xanax  for pain management. He is concerned about potential discontinuation of oxycodone  due to regulatory pressures. He has a history of neck surgery in 1995 and has previously undergone steroid injections, which he discontinued due to adverse effects including thrush and 'steroid rage'.  He discontinued Cymbalta  after developing facial bumps, later diagnosed as sebaceous hyperplasia and rosacea by a dermatologist. Cymbalta  had a calming effect and was beneficial for his anxiety and pain. He has a history of using Prozac , which was increased from 40 mg to 60 mg but discontinued due to lack of efficacy. He has also tried Zoloft without significant improvement.  He takes Trazodone  200 mg at night for sleep but reports poor sleep quality and vivid dreams. He also takes testosterone  shots and has a history of dermatitis on his scalp, treated with medicated shampoo.  He is experiencing significant stress related to his mother's recent back surgery and her dementia, as well as his inability to assist her due to his own health issues. He also mentions stress from his living situation and relationship dynamics with his girlfriend.  Denies suicidal ideation but wants to regain his previous  quality of life.  Denies any homicidality or perceptual disturbances.   Visit Diagnosis:    ICD-10-CM   1. MDD (major depressive disorder), recurrent episode, moderate (HCC)  F33.1 DULoxetine  (CYMBALTA ) 30 MG capsule    2. GAD (generalized anxiety disorder)  F41.1 DULoxetine  (CYMBALTA ) 30 MG capsule    ALPRAZolam  (XANAX ) 0.5 MG tablet    3. Insomnia, unspecified type  G47.00     4. Benzodiazepine dependence (HCC)  F13.20       Past Psychiatric History: I have reviewed past psychiatric history from progress note on 03/20/2023.  Past trials of Zoloft, fluoxetine .  Past Medical History:  Past Medical History:  Diagnosis Date   Anxiety    Arthritis    Cyst, kidney, acquired    Depression    GERD (gastroesophageal reflux disease)    Hyperlipidemia    Neuromuscular disorder (HCC)    Spine degeneration    Substance abuse (HCC)     Past Surgical History:  Procedure Laterality Date   ANTERIOR FUSION CERVICAL SPINE  1995   finger debridement      Family Psychiatric History: I have reviewed family psychiatric history from progress note on 03/20/2023.  Family History:  Family History  Problem Relation Age of Onset   Arthritis Mother    Hypertension Mother    Hyperlipidemia Mother    Anxiety disorder Mother    Depression Father    COPD Father    Alcohol abuse Maternal Uncle    Alcohol abuse Paternal Uncle    Alcohol abuse Paternal Grandfather    Alcohol abuse Cousin     Social  History: I have reviewed social history from progress note on 03/20/2023. Social History   Socioeconomic History   Marital status: Divorced    Spouse name: Not on file   Number of children: 0   Years of education: Not on file   Highest education level: High school graduate  Occupational History   Not on file  Tobacco Use   Smoking status: Former    Current packs/day: 0.00    Average packs/day: 1 pack/day for 38.3 years (38.3 ttl pk-yrs)    Types: Cigarettes    Start date: 3    Quit date:  05/29/2021    Years since quitting: 2.0   Smokeless tobacco: Never  Vaping Use   Vaping status: Never Used  Substance and Sexual Activity   Alcohol use: No   Drug use: Not Currently    Comment: former substance abuser - cocaine, pills, marijuana   Sexual activity: Not Currently  Other Topics Concern   Not on file  Social History Narrative   Not on file   Social Drivers of Health   Financial Resource Strain: Not on file  Food Insecurity: Not on file  Transportation Needs: Not on file  Physical Activity: Not on file  Stress: Not on file  Social Connections: Not on file    Allergies:  Allergies  Allergen Reactions   Nsaids Nausea And Vomiting and Anaphylaxis    Only in very high doses   Prednisone Anaphylaxis   Tolmetin Nausea And Vomiting    Only in very high doses    Metabolic Disorder Labs: Lab Results  Component Value Date   HGBA1C 5.6 12/15/2022   No results found for: "PROLACTIN" Lab Results  Component Value Date   CHOL 132 12/15/2022   TRIG 118 12/15/2022   HDL 36 (L) 12/15/2022   CHOLHDL 3.7 12/15/2022   LDLCALC 75 12/15/2022   LDLCALC 91 06/16/2022   Lab Results  Component Value Date   TSH 1.860 05/17/2021    Therapeutic Level Labs: No results found for: "LITHIUM" No results found for: "VALPROATE" No results found for: "CBMZ"  Current Medications: Current Outpatient Medications  Medication Sig Dispense Refill   atorvastatin  (LIPITOR) 20 MG tablet Take 1 tablet (20 mg total) by mouth at bedtime. 90 tablet 3   cetirizine (ZYRTEC) 10 MG chewable tablet Chew 10 mg by mouth.     Cholecalciferol (VITAMIN D3 PO) Take by mouth.     Cyanocobalamin  (B-12 PO) Take by mouth.     hydrochlorothiazide  (MICROZIDE ) 12.5 MG capsule Take 1 capsule (12.5 mg total) by mouth every morning. 90 capsule 3   irbesartan  (AVAPRO ) 75 MG tablet Take 1 tablet (75 mg total) by mouth daily. 90 tablet 3   ketoconazole  (NIZORAL ) 2 % shampoo apply three times per week, massage  into scalp and leave in for at least 5 minutes before rinsing out 120 mL 11   lansoprazole  (PREVACID ) 15 MG capsule TAKE 1 CAPSULE BY MOUTH ONCE DAILY 90 capsule 3   Magnesium Gluconate 550 MG TABS Take by mouth.     metroNIDAZOLE  (METROCREAM ) 0.75 % cream Apply twice daily to affected areas of face 45 g 11   Multiple Vitamin (MULTIVITAMIN ADULT PO) Take by mouth.     oxyCODONE -acetaminophen  (PERCOCET) 7.5-325 MG tablet Take 1 tablet by mouth every 8 (eight) hours as needed for severe pain (pain score 7-10). 90 tablet 0   SYRINGE-NEEDLE, DISP, 3 ML (B-D SYRINGE/NEEDLE 3CC/23GX1") 23G X 1" 3 ML MISC Use 1 syringe with needle  every 21 days; To be used for IM testosterone  injection. 50 each 0   testosterone  cypionate (DEPOTESTOSTERONE CYPIONATE) 200 MG/ML injection Inject 0.5 mLs (100 mg total) into the muscle every 21 days. 10 mL 0   tiZANidine  (ZANAFLEX ) 2 MG tablet TAKE 1 TABLET BY MOUTH EVERY 6 HOURS AS NEEDED FOR MUSCLE SPASM **NO MORE THAN 4 TABS IN 24 HOURS** 120 tablet 1   traZODone  (DESYREL ) 100 MG tablet Take 1-2 tablets (100-200 mg total) by mouth at bedtime. Takes (2) 100mg  tabs every night 180 tablet 3   valsartan (DIOVAN) 80 MG tablet Take 80 mg by mouth.     ALPRAZolam  (XANAX ) 0.5 MG tablet Take 1 tablet (0.5 mg total) by mouth as directed. Take HALF tablet daily at 9 AM , HALF tablet daily at 1 PM 30 tablet 0   DULoxetine  (CYMBALTA ) 30 MG capsule Take 1 capsule (30 mg total) by mouth 2 (two) times daily. Dose change 60 capsule 1   No current facility-administered medications for this visit.     Musculoskeletal: Strength & Muscle Tone: within normal limits Gait & Station: normal Patient leans: N/A  Psychiatric Specialty Exam: Review of Systems  Psychiatric/Behavioral:  Positive for dysphoric mood and sleep disturbance. The patient is nervous/anxious.     Blood pressure 110/82, pulse (!) 112, temperature 98.6 F (37 C), temperature source Temporal, height 6' (1.829 m), weight  211 lb 9.6 oz (96 kg), SpO2 100%.Body mass index is 28.7 kg/m.  General Appearance: Casual  Eye Contact:  Fair  Speech:  Clear and Coherent  Volume:  Normal  Mood:  Anxious and Depressed  Affect:  Congruent  Thought Process:  Goal Directed and Descriptions of Associations: Intact  Orientation:  Full (Time, Place, and Person)  Thought Content: Logical   Suicidal Thoughts:  No  Homicidal Thoughts:  No  Memory:  Immediate;   Fair Recent;   Fair Remote;   Fair  Judgement:  Fair  Insight:  Fair  Psychomotor Activity:  Normal  Concentration:  Concentration: Fair and Attention Span: Fair  Recall:  [employer] of Knowledge: Fair  Language: Fair  Akathisia:  No  Handed:  Right  AIMS (if indicated): not done  Assets:  Desire for Improvement Housing Social Support Transportation  ADL's:  Intact  Cognition: WNL  Sleep:  Poor   Screenings: GAD-7    Loss adjuster, chartered Office Visit from 04/27/2023 in [facility] Office Visit from 03/20/2023 in [facility]  Total GAD-7 Score 13 13      Mini-Mental    Flowsheet Row Office Visit from 06/15/2023 in [facility], [facility] Office Visit from 06/14/2022 in [facility], [facility] Office Visit from 06/09/2021 in [facility], [facility]  Total Score (max 30 points ) 30 30 30       PHQ2-9    Flowsheet Row Office Visit from 04/27/2023 in [facility] Office Visit from 03/20/2023 in [facility] Office Visit from 03/07/2022 in [facility], [facility] Office Visit from 11/08/2021 in [facility], [facility] Office Visit from 08/11/2021 in [facility], [facility]  PHQ-2 Total Score 4 0 6 6 0  PHQ-9 Total Score 17 14 18 16  --      Flowsheet Row Office Visit from 06/20/2023 in [facility] Video Visit from 05/17/2023 in [facility] Office Visit from 04/27/2023 in [facility]  C-SSRS RISK CATEGORY No Risk No Risk No Risk        Assessment and Plan: REYLAN VARDEMAN is a 63 year old Caucasian male with history of depression, anxiety, chronic pain was evaluated for a follow-up appointment, discussed assessment and plan as noted below.  Generalized anxiety disorder-unstable He is currently not on the Cymbalta , discontinued it due to concerns of skin lesions although as per dermatology consultation likely not due to Cymbalta  and he was started on treatment for rosacea.  He is agreeable to a retrial of Cymbalta  to manage his anxiety which is currently complicated by his chronic pain.  He is pursuing treatment for the same with pain management. - Reduce Alprazolam  to 0.25 mg twice daily. - Patient to continue to monitor for withdrawal symptoms. - Reviewed [place]MP AWARxE - Restart Cymbalta  60 mg daily. - Advised to monitor for any skin lesions and to get immediate help if he does have side effects.  MDD-unstable Currently continues to struggle with depression, irritability complicated by his chronic pain as well as the fact that he is currently not on an antidepressant. - Restart Duloxetine  60 mg daily. - Discussed referral for CBT, patient declines  Insomnia-unstable Currently sleep problems multifactorial including his pain. - Patient will need sufficient pain management. - Continue Trazodone  100-200 mg at bedtime as needed for sleep now. - Will consider changing his sleep medication in the future as needed.  Benzodiazepine dependence-improving Currently undergoing tapering process of alprazolam . - Reduce Alprazolam  to 0.25 mg twice daily. - Patient aware to go to the nearest emergency department if going through significant withdrawal symptoms or any side effects from current medication regimen.   Follow-up Follow-up in clinic in 3  to 4 weeks or sooner if needed.      Collaboration of Care: Collaboration of Care: Patient refused AEB patient declines referral for CBT.  Patient/Guardian was advised Release of Information must be obtained prior to any record release in order to collaborate their care with an outside provider. Patient/Guardian was advised if they have not already done so to contact the registration department to sign all necessary forms in order for us  to release information regarding their care.   Consent: Patient/Guardian gives verbal consent for treatment and assignment of benefits for services provided during this visit. Patient/Guardian expressed understanding and agreed to proceed.   This note was generated in part or whole with voice recognition software. Voice recognition is usually quite accurate but there are transcription errors that can and very often do occur. I apologize for any typographical errors that were not detected and corrected.    Jamaiya Tunnell, MD 06/21/2023, 5:31 PM

## 2023-06-21 ENCOUNTER — Encounter: Payer: Self-pay | Admitting: Psychiatry

## 2023-06-21 ENCOUNTER — Other Ambulatory Visit: Payer: Self-pay | Admitting: Nurse Practitioner

## 2023-06-21 DIAGNOSIS — E782 Mixed hyperlipidemia: Secondary | ICD-10-CM | POA: Diagnosis not present

## 2023-06-21 DIAGNOSIS — R7303 Prediabetes: Secondary | ICD-10-CM | POA: Diagnosis not present

## 2023-06-21 DIAGNOSIS — E559 Vitamin D deficiency, unspecified: Secondary | ICD-10-CM | POA: Diagnosis not present

## 2023-06-21 DIAGNOSIS — E291 Testicular hypofunction: Secondary | ICD-10-CM | POA: Diagnosis not present

## 2023-06-22 LAB — COMPREHENSIVE METABOLIC PANEL WITH GFR
ALT: 19 IU/L (ref 0–44)
AST: 20 IU/L (ref 0–40)
Albumin: 4.6 g/dL (ref 3.9–4.9)
Alkaline Phosphatase: 81 IU/L (ref 44–121)
BUN/Creatinine Ratio: 10 (ref 10–24)
BUN: 11 mg/dL (ref 8–27)
Bilirubin Total: 0.4 mg/dL (ref 0.0–1.2)
CO2: 23 mmol/L (ref 20–29)
Calcium: 9.6 mg/dL (ref 8.6–10.2)
Chloride: 98 mmol/L (ref 96–106)
Creatinine, Ser: 1.13 mg/dL (ref 0.76–1.27)
Globulin, Total: 3 g/dL (ref 1.5–4.5)
Glucose: 109 mg/dL — ABNORMAL HIGH (ref 70–99)
Potassium: 4.3 mmol/L (ref 3.5–5.2)
Sodium: 140 mmol/L (ref 134–144)
Total Protein: 7.6 g/dL (ref 6.0–8.5)
eGFR: 73 mL/min/{1.73_m2} (ref >59)

## 2023-06-22 LAB — CBC WITH DIFFERENTIAL/PLATELET
Basophils Absolute: 0.1 10*3/uL (ref 0.0–0.2)
Basos: 1 %
EOS (ABSOLUTE): 0.2 10*3/uL (ref 0.0–0.4)
Eos: 3 %
Hematocrit: 43.4 % (ref 37.5–51.0)
Hemoglobin: 14.9 g/dL (ref 13.0–17.7)
Immature Grans (Abs): 0.1 10*3/uL (ref 0.0–0.1)
Immature Granulocytes: 1 %
Lymphocytes Absolute: 2.1 10*3/uL (ref 0.7–3.1)
Lymphs: 30 %
MCH: 32.3 pg (ref 26.6–33.0)
MCHC: 34.3 g/dL (ref 31.5–35.7)
MCV: 94 fL (ref 79–97)
Monocytes Absolute: 0.5 10*3/uL (ref 0.1–0.9)
Monocytes: 7 %
Neutrophils Absolute: 4.2 10*3/uL (ref 1.4–7.0)
Neutrophils: 58 %
Platelets: 231 10*3/uL (ref 150–450)
RBC: 4.62 x10E6/uL (ref 4.14–5.80)
RDW: 12.6 % (ref 11.6–15.4)
WBC: 7.1 10*3/uL (ref 3.4–10.8)

## 2023-06-22 LAB — LIPID PANEL
Chol/HDL Ratio: 4.4 ratio (ref 0.0–5.0)
Cholesterol, Total: 145 mg/dL (ref 100–199)
HDL: 33 mg/dL — ABNORMAL LOW (ref >39)
LDL Chol Calc (NIH): 84 mg/dL (ref 0–99)
Triglycerides: 162 mg/dL — ABNORMAL HIGH (ref 0–149)
VLDL Cholesterol Cal: 28 mg/dL (ref 5–40)

## 2023-06-22 LAB — HGB A1C W/O EAG: Hgb A1c MFr Bld: 5.6 % (ref 4.8–5.6)

## 2023-06-22 LAB — TESTOSTERONE: Testosterone: 623 ng/dL (ref 264–916)

## 2023-06-22 LAB — VITAMIN D 25 HYDROXY (VIT D DEFICIENCY, FRACTURES): Vit D, 25-Hydroxy: 50.3 ng/mL (ref 30.0–100.0)

## 2023-06-27 ENCOUNTER — Telehealth: Payer: Self-pay

## 2023-06-27 NOTE — Telephone Encounter (Signed)
 pt called states that he can not take the cymalta. states that his ears is tingling, his mouth is dry and his lips and tounge is cracking because of the dryness.  Pt was last seen on  5-6 no follow up was made.

## 2023-06-27 NOTE — Telephone Encounter (Signed)
 Staff was advised to contact patient to schedule an in office visit tomorrow.  Patient has appointment scheduled for tomorrow at 10:30 AM.  Patient is aware.

## 2023-06-28 ENCOUNTER — Telehealth: Payer: Self-pay

## 2023-06-28 ENCOUNTER — Encounter: Payer: Self-pay | Admitting: Psychiatry

## 2023-06-28 ENCOUNTER — Other Ambulatory Visit: Payer: Self-pay

## 2023-06-28 ENCOUNTER — Ambulatory Visit (INDEPENDENT_AMBULATORY_CARE_PROVIDER_SITE_OTHER): Admitting: Psychiatry

## 2023-06-28 VITALS — BP 106/76 | HR 102 | Temp 98.2°F | Ht 72.0 in | Wt 206.8 lb

## 2023-06-28 DIAGNOSIS — F411 Generalized anxiety disorder: Secondary | ICD-10-CM | POA: Diagnosis not present

## 2023-06-28 DIAGNOSIS — F1320 Sedative, hypnotic or anxiolytic dependence, uncomplicated: Secondary | ICD-10-CM

## 2023-06-28 DIAGNOSIS — F331 Major depressive disorder, recurrent, moderate: Secondary | ICD-10-CM | POA: Diagnosis not present

## 2023-06-28 DIAGNOSIS — T50905A Adverse effect of unspecified drugs, medicaments and biological substances, initial encounter: Secondary | ICD-10-CM

## 2023-06-28 DIAGNOSIS — G4700 Insomnia, unspecified: Secondary | ICD-10-CM | POA: Diagnosis not present

## 2023-06-28 MED ORDER — PAROXETINE HCL ER 12.5 MG PO TB24
12.5000 mg | ORAL_TABLET | Freq: Every day | ORAL | 0 refills | Status: DC
Start: 2023-06-28 — End: 2023-06-29

## 2023-06-28 NOTE — Telephone Encounter (Signed)
 Noted and thank you

## 2023-06-28 NOTE — Telephone Encounter (Signed)
 Received fax to initiate a Prior Authorization for the patients Paroxetine ER 12.5 mg tablets sent to patients insurance via CoverMyMeds   Approved  [place] Case ID [id number]  Coverage 06-28-23 / 02-14-24

## 2023-06-28 NOTE — Progress Notes (Signed)
 [facility] MD OP Progress Note  06/28/2023 5:12 PM Nathan Meyer  MRN:  [id number]  Chief Complaint:  Chief Complaint  Patient presents with   Follow-up   Anxiety   Depression   Medication Problem   Discussed the use of AI scribe software for clinical note transcription with the patient, who gave verbal consent to proceed.  History of Present Illness Nathan Meyer is a 63 year old Caucasian male lives with girlfriend in [place], has a history of MDD, GAD, insomnia, chronic pain was evaluated in office today, presents with side effects from Cymbalta .  He is experiencing significant side effects after restarting Cymbalta , including severe dry mouth, tingling in the ear, facial flushing, and sweating. The dry mouth is so severe that he feels he might choke and has to frequently drink water to swallow. He also mentions a sensation of thrush, although he is uncertain if he has it. He has been using biotin rinse, vitamin C, and probiotics, which provided some relief. He took a 30 mg dose of Cymbalta  this morning but wants to discontinue it due to these side effects.  He has a history of anxiety and depression, which have been exacerbated over the past two years, significantly impacting his daily life.He has previously tried Zoloft and Prozac  for his symptoms.  He is currently taking trazodone  for sleep . He also takes Xanax , one per day in divided dosage, and is concerned about withdrawal symptoms as he attempts to taper off. He reports experiencing sleep problems, and is currently on a higher dosage of trazodone .  However he is not sure if the 200 mg of trazodone  has been helpful with his sleep.  He does have a history of chronic pain due to spinal stenosis and his pain also has been contributing to his sleep problems and mood symptoms.  He currently denies any suicidality, homicidality or perceptual disturbances.  He appeared to be alert, oriented to person place time situation.  3 word  memory immediate 3 out of 3, after 5 minutes 3 out of 3.  Was able to spell the word 'WORLD' forward and backward, attention and focus seem to be good.    Visit Diagnosis:    ICD-10-CM   1. MDD (major depressive disorder), recurrent episode, moderate (HCC)  F33.1 PARoxetine (PAXIL-CR) 12.5 MG 24 hr tablet    2. GAD (generalized anxiety disorder)  F41.1 PARoxetine (PAXIL-CR) 12.5 MG 24 hr tablet    3. Insomnia, unspecified type  G47.00     4. Benzodiazepine dependence (HCC)  F13.20     5. Adverse effect of drug, initial encounter  T50.905A    Duloxetine       Past Psychiatric History: I have reviewed past psychiatric history from progress note on 03/20/2023.  Past trials of medications like Zoloft, Fluoxetine , Cymbalta .  Past Medical History:  Past Medical History:  Diagnosis Date   Anxiety    Arthritis    Cyst, kidney, acquired    Depression    GERD (gastroesophageal reflux disease)    Hyperlipidemia    Neuromuscular disorder (HCC)    Spine degeneration    Substance abuse (HCC)     Past Surgical History:  Procedure Laterality Date   ANTERIOR FUSION CERVICAL SPINE  1995   finger debridement      Family Psychiatric History: I have reviewed family psychiatric history from progress note on 03/20/2023.  Family History:  Family History  Problem Relation Age of Onset   Arthritis Mother    Hypertension Mother  Hyperlipidemia Mother    Anxiety disorder Mother    Depression Father    COPD Father    Alcohol abuse Maternal Uncle    Alcohol abuse Paternal Uncle    Alcohol abuse Paternal Grandfather    Alcohol abuse Cousin     Social History: I have reviewed social history from progress note on 03/20/2023. Social History   Socioeconomic History   Marital status: Divorced    Spouse name: Not on file   Number of children: 0   Years of education: Not on file   Highest education level: High school graduate  Occupational History   Not on file  Tobacco Use   Smoking  status: Former    Current packs/day: 0.00    Average packs/day: 1 pack/day for 38.3 years (38.3 ttl pk-yrs)    Types: Cigarettes    Start date: 29    Quit date: 05/29/2021    Years since quitting: 2.0   Smokeless tobacco: Never  Vaping Use   Vaping status: Never Used  Substance and Sexual Activity   Alcohol use: No   Drug use: Not Currently    Comment: former substance abuser - cocaine, pills, marijuana   Sexual activity: Not Currently  Other Topics Concern   Not on file  Social History Narrative   Not on file   Social Drivers of Health   Financial Resource Strain: Not on file  Food Insecurity: Not on file  Transportation Needs: Not on file  Physical Activity: Not on file  Stress: Not on file  Social Connections: Not on file    Allergies:  Allergies  Allergen Reactions   Nsaids Nausea And Vomiting and Anaphylaxis    Only in very high doses   Prednisone Anaphylaxis   Tolmetin Nausea And Vomiting    Only in very high doses    Metabolic Disorder Labs: Lab Results  Component Value Date   HGBA1C 5.6 06/21/2023   No results found for: "PROLACTIN" Lab Results  Component Value Date   CHOL 145 06/21/2023   TRIG 162 (H) 06/21/2023   HDL 33 (L) 06/21/2023   CHOLHDL 4.4 06/21/2023   LDLCALC 84 06/21/2023   LDLCALC 75 12/15/2022   Lab Results  Component Value Date   TSH 1.860 05/17/2021    Therapeutic Level Labs: No results found for: "LITHIUM" No results found for: "VALPROATE" No results found for: "CBMZ"  Current Medications: Current Outpatient Medications  Medication Sig Dispense Refill   ALPRAZolam  (XANAX ) 0.5 MG tablet Take 1 tablet (0.5 mg total) by mouth as directed. Take HALF tablet daily at 9 AM , HALF tablet daily at 1 PM 30 tablet 0   atorvastatin  (LIPITOR) 20 MG tablet Take 1 tablet (20 mg total) by mouth at bedtime. 90 tablet 3   cetirizine (ZYRTEC) 10 MG chewable tablet Chew 10 mg by mouth.     Cholecalciferol (VITAMIN D3 PO) Take by mouth.      Cyanocobalamin  (B-12 PO) Take by mouth.     hydrochlorothiazide  (MICROZIDE ) 12.5 MG capsule Take 1 capsule (12.5 mg total) by mouth every morning. 90 capsule 3   irbesartan  (AVAPRO ) 75 MG tablet Take 1 tablet (75 mg total) by mouth daily. 90 tablet 3   ketoconazole  (NIZORAL ) 2 % shampoo apply three times per week, massage into scalp and leave in for at least 5 minutes before rinsing out 120 mL 11   lansoprazole  (PREVACID ) 15 MG capsule TAKE 1 CAPSULE BY MOUTH ONCE DAILY 90 capsule 3   Magnesium Gluconate  550 MG TABS Take by mouth.     metroNIDAZOLE  (METROCREAM ) 0.75 % cream Apply twice daily to affected areas of face 45 g 11   Multiple Vitamin (MULTIVITAMIN ADULT PO) Take by mouth.     oxyCODONE -acetaminophen  (PERCOCET) 7.5-325 MG tablet Take 1 tablet by mouth every 8 (eight) hours as needed for severe pain (pain score 7-10). 90 tablet 0   PARoxetine (PAXIL-CR) 12.5 MG 24 hr tablet Take 1 tablet (12.5 mg total) by mouth daily with breakfast. 30 tablet 0   SYRINGE-NEEDLE, DISP, 3 ML (B-D SYRINGE/NEEDLE 3CC/23GX1") 23G X 1" 3 ML MISC Use 1 syringe with needle every 21 days; To be used for IM testosterone  injection. 50 each 0   testosterone  cypionate (DEPOTESTOSTERONE CYPIONATE) 200 MG/ML injection Inject 0.5 mLs (100 mg total) into the muscle every 21 days. 10 mL 0   tiZANidine  (ZANAFLEX ) 2 MG tablet TAKE 1 TABLET BY MOUTH EVERY 6 HOURS AS NEEDED FOR MUSCLE SPASM **NO MORE THAN 4 TABS IN 24 HOURS** 120 tablet 1   traZODone  (DESYREL ) 100 MG tablet Take 1-2 tablets (100-200 mg total) by mouth at bedtime. Takes (2) 100mg  tabs every night 180 tablet 3   valsartan (DIOVAN) 80 MG tablet Take 80 mg by mouth.     No current facility-administered medications for this visit.     Musculoskeletal: Strength & Muscle Tone: within normal limits Gait & Station: normal Patient leans: N/A  Psychiatric Specialty Exam: Review of Systems  Psychiatric/Behavioral:  Positive for dysphoric mood and sleep  disturbance. The patient is nervous/anxious.     Blood pressure 106/76, pulse (!) 102, temperature 98.2 F (36.8 C), temperature source Temporal, height 6' (1.829 m), weight 206 lb 12.8 oz (93.8 kg).Body mass index is 28.05 kg/m.  General Appearance: Casual  Eye Contact:  Fair  Speech:  Clear and Coherent  Volume:  Normal  Mood:  Anxious and Depressed  Affect:  Congruent  Thought Process:  Goal Directed and Descriptions of Associations: Intact  Orientation:  Full (Time, Place, and Person)  Thought Content: Logical   Suicidal Thoughts:  No  Homicidal Thoughts:  No  Memory:  Immediate;   Fair Recent;   Fair Remote;   Fair  Judgement:  Fair  Insight:  Fair  Psychomotor Activity:  Normal  Concentration:  Concentration: Fair and Attention Span: Fair  Recall:  [employer] of Knowledge: Fair  Language: Fair  Akathisia:  No  Handed:  Right  AIMS (if indicated): not done  Assets:  Desire for Improvement Housing Social Support Transportation  ADL's:  Intact  Cognition: WNL  Sleep:  Poor   Screenings: GAD-7    Loss adjuster, chartered Office Visit from 04/27/2023 in [facility] Office Visit from 03/20/2023 in [facility]  Total GAD-7 Score 13 13      Mini-Mental    Flowsheet Row Office Visit from 06/15/2023 in [facility], [facility] Office Visit from 06/14/2022 in [facility], [facility] Office Visit from 06/09/2021 in [facility], [facility]  Total Score (max 30 points ) 30 30 30       PHQ2-9    Flowsheet Row Office Visit from 04/27/2023 in [facility] Office Visit from 03/20/2023 in [facility] Office Visit from 03/07/2022 in [facility], [facility] Office Visit from 11/08/2021 in [facility], [facility] Office Visit from 08/11/2021 in [facility], [facility]  PHQ-2 Total Score 4 0 6 6  0  PHQ-9  Total Score 17 14 18 16  --      Flowsheet Row Office Visit from 06/28/2023 in [facility] Office Visit from 06/20/2023 in [facility] Video Visit from 05/17/2023 in [facility]  C-SSRS RISK CATEGORY No Risk No Risk No Risk        Assessment and Plan: NIKUNJ LIPSETT is a 63 year old Caucasian male with history of depression, anxiety, chronic pain was evaluated for a follow-up appointment.  Patient contacted the clinic reporting side effects to Cymbalta  and this urgent appointment was scheduled.   Adverse side effects to medications-Cymbalta -unstable Currently presents with adverse side effects to Cymbalta  including dry mouth, tingling of his ear, sweats.  Also reports shakiness of his lower extremities when he took the Cymbalta .  He was just restarted on Cymbalta  a week ago based on patient preference.  Took a lower dosage this morning. - Discontinue Cymbalta  due to side effects. - Patient to monitor symptoms and if any worsening advised to call 911 or go to the nearest emergency department.  Generalized anxiety disorder-unstable Patient with adverse side effects to Cymbalta .  Patient with continued anxiety related symptoms.  Patient agreeable to trial of a new antidepressant/antianxiety agent.  Discussed paroxetine. - Start Paroxetine extended release 12.5 mg daily.  If this is not affordable could send Paroxetine 10 mg to pharmacy. - Patient to check with the pharmacy and let this provider know. - Continue reduced Alprazolam  0.25 mg twice daily. - Patient to continue to monitor for withdrawal symptoms. - Reviewed [place] PMP AWARxE  MDD-unstable Patient with ongoing depression, irritability complicated by chronic pain as well as recent adverse side effect of antidepressant, Cymbalta . - Discussed referral for CBT-patient declines - Start Paroxetine as noted above.  Plan  to gradually increase the dosage.  Insomnia-unstable Continues to struggle with sleep multifactorial including comorbid pain. - Will need sufficient pain management. - Continue Trazodone  100-200 mg at bedtime as needed for sleep. - Could add low dosage Melatonin along with trazodone . - Will consider changing sleep medications in the future as needed.  Benzodiazepine dependence-improving Currently patient is undergoing tapering off alprazolam .  Long-term goal is to taper off completely. Patient aware of long-term risk factors of being on benzodiazepine therapy as well as drug to drug interaction with opioids. - Continue Alprazolam  0.25 mg twice daily recently reduced dosage. - Patient aware to monitor for side effects of withdrawal and go to the nearest emergency department or call 911 as needed.  Follow-up Follow-up in clinic in 2 weeks or sooner if needed.     Collaboration of Care: Collaboration of Care: Other patient encouraged to contact primary care provider for management of pain, patient to go to the nearest emergency department if still having side effects to duloxetine /Cymbalta  or withdrawal symptoms from being tapered off of benzodiazepine.  Patient/Guardian was advised Release of Information must be obtained prior to any record release in order to collaborate their care with an outside provider. Patient/Guardian was advised if they have not already done so to contact the registration department to sign all necessary forms in order for us  to release information regarding their care.   Consent: Patient/Guardian gives verbal consent for treatment and assignment of benefits for services provided during this visit. Patient/Guardian expressed understanding and agreed to proceed.   This note was generated in part or whole with voice recognition software. Voice recognition is usually quite accurate but there are transcription  errors that can and very often do occur. I apologize for any  typographical errors that were not detected and corrected.    Samyukta Cura, MD 06/28/2023, 5:12 PM

## 2023-06-28 NOTE — Patient Instructions (Signed)
 Paroxetine Controlled-Release Tablets What is this medication? PAROXETINE (pa ROX e teen) treats depression, anxiety, obsessive-compulsive disorder (OCD), post-traumatic stress disorder (PTSD), and premenstrual dysphoric disorder (PMDD). It increases the amount of serotonin in the brain, a hormone that helps regulate mood. It belongs to a group of medications called SSRIs. This medicine may be used for other purposes; ask your health care provider or pharmacist if you have questions. COMMON BRAND NAME(S): Paxil CR What should I tell my care team before I take this medication? They need to know if you have any of these conditions: Bipolar disorder or a family history of bipolar disorder Bleeding disorder Glaucoma Heart disease Kidney disease Liver disease Low levels of sodium in the blood Seizures Suicidal thoughts, plans, or attempt by you or a family member Take MAOIs, such as Carbex, Eldepryl, Marplan, Nardil, and Parnate Take medications that treat or prevent blood clots Thyroid  disease An unusual or allergic reaction to paroxetine, other medications, foods, dyes, or preservatives Pregnant or trying to get pregnant Breastfeeding How should I use this medication? Take this medication by mouth with water. Take it as directed on the prescription label at the same time every day. You can take it with or without food. If it upsets your stomach, take it with food. Do not cut, crush, or chew this medication. Swallow the tablets whole. Do not take your medication more often than directed. Keep taking this medication unless your care team tells you to stop. Stopping it too quickly can cause serious side effects. It can also make your condition worse. A special MedGuide will be given to you by the pharmacist with each prescription and refill. Be sure to read this information carefully each time. Talk to your care team about the use of this medication in children. Special care may be  needed. Overdosage: If you think you have taken too much of this medicine contact a poison control center or emergency room at once. NOTE: This medicine is only for you. Do not share this medicine with others. What if I miss a dose? If you miss a dose, take it as soon as you can. If it is almost time for your next dose, take only that dose. Do not take double or extra doses. What may interact with this medication? Do not take this medication with any of the following: Linezolid MAOIs, such as Carbex, Eldepryl, Marplan, Nardil, and Parnate Methylene blue (injected into a vein) Pimozide Thioridazine This medication may also interact with the following: Alcohol Amphetamines Aspirin and aspirin-like medications Atomoxetine Certain medications for irregular heart beat, such as propafenone, flecainide, encainide, and quinidine Certain medications for mental health conditions Certain medications for migraine headache, such as almotriptan, eletriptan, frovatriptan, naratriptan, rizatriptan, sumatriptan, zolmitriptan Cimetidine Digoxin Diuretics Fentanyl Fosamprenavir Furazolidone Isoniazid Lithium Medications that treat or prevent blood clots, such as warfarin, enoxaparin, and dalteparin Medications for sleep NSAIDs, medications for pain and inflammation, such as ibuprofen or naproxen Phenobarbital Phenytoin Procarbazine Rasagiline Ritonavir Supplements, such as St. John's wort, kava kava, valerian Tamoxifen Tramadol Tryptophan This list may not describe all possible interactions. Give your health care provider a list of all the medicines, herbs, non-prescription drugs, or dietary supplements you use. Also tell them if you smoke, drink alcohol, or use illegal drugs. Some items may interact with your medicine. What should I watch for while using this medication? Tell your care team if your symptoms do not get better or if they get worse. Visit your care team for regular checks on  your progress. Because it may take several weeks to see the full effects of this medication, it is important to continue your treatment as prescribed by your care team. Watch for new or worsening thoughts of suicide or depression. This includes sudden changes in mood, behaviors, or thoughts. These changes can happen at any time but are more common in the beginning of treatment or after a change in dose. Call your care team right away if you experience these thoughts or worsening depression. This medication may cause mood and behavior changes, such as anxiety, nervousness, irritability, hostility, restlessness, excitability, hyperactivity, or trouble sleeping. These changes can happen at any time but are more common in the beginning of treatment or after a change in dose. Call your care team right away if you notice any of these symptoms. This medication may affect your coordination, reaction time, or judgment. Do not drive or operate machinery until you know how this medication affects you. Sit up or stand slowly to reduce the risk of dizzy or fainting spells. Drinking alcohol with this medication can increase the risk of these side effects. Your mouth may get dry. Chewing sugarless gum or sucking hard candy and drinking plenty of water may help. Contact your care team if the problem does not go away or is severe. What side effects may I notice from receiving this medication? Side effects that you should report to your care team as soon as possible: Allergic reactions--skin rash, itching, hives, swelling of the face, lips, tongue, or throat Bleeding--bloody or black, tar-like stools, red or dark brown urine, vomiting blood or brown material that looks like coffee grounds, small, red or purple spots on skin, unusual bleeding or bruising Heart rhythm changes--fast or irregular heartbeat, dizziness, feeling faint or lightheaded, chest pain, trouble breathing Low sodium level--muscle weakness, fatigue,  dizziness, headache, confusion Serotonin syndrome--irritability, confusion, fast or irregular heartbeat, muscle stiffness, twitching muscles, sweating, high fever, seizure, chills, vomiting, diarrhea Sudden eye pain or change in vision such as blurry vision, seeing halos around lights, vision loss Thoughts of suicide or self-harm, worsening mood, feelings of depression Side effects that usually do not require medical attention (report to your care team if they continue or are bothersome): Change in sex drive or performance Diarrhea Excessive sweating Nausea Tremors or shaking Upset stomach This list may not describe all possible side effects. Call your doctor for medical advice about side effects. You may report side effects to FDA at 1-800-FDA-1088. Where should I keep my medication? Keep out of the reach of children and pets. Store at or below 25 degrees C (77 degrees F). Throw away any unused medication after the expiration date. NOTE: This sheet is a summary. It may not cover all possible information. If you have questions about this medicine, talk to your doctor, pharmacist, or health care provider.  2024 Elsevier/Gold Standard (2021-12-06 00:00:00)

## 2023-06-28 NOTE — Telephone Encounter (Signed)
 Noted.

## 2023-06-29 ENCOUNTER — Other Ambulatory Visit: Payer: Self-pay | Admitting: Psychiatry

## 2023-06-29 MED ORDER — PAROXETINE HCL 10 MG PO TABS: 10.0000 mg | ORAL_TABLET | Freq: Every day | ORAL | 0 refills | Status: DC

## 2023-06-29 NOTE — Telephone Encounter (Signed)
 Paxil 10 mg daily is ordered. Please contact the pharmacy and cancel the other Paxil order.

## 2023-06-30 MED ORDER — FLUCONAZOLE 150 MG PO TABS: 150.0000 mg | ORAL_TABLET | Freq: Every day | ORAL | 0 refills | Status: AC

## 2023-06-30 NOTE — Telephone Encounter (Signed)
 Patient notified

## 2023-07-12 ENCOUNTER — Encounter: Payer: Self-pay | Admitting: Psychiatry

## 2023-07-12 ENCOUNTER — Ambulatory Visit (INDEPENDENT_AMBULATORY_CARE_PROVIDER_SITE_OTHER): Admitting: Psychiatry

## 2023-07-12 ENCOUNTER — Other Ambulatory Visit: Payer: Self-pay

## 2023-07-12 VITALS — BP 112/78 | HR 103 | Temp 97.6°F | Ht 72.0 in | Wt 210.4 lb

## 2023-07-12 DIAGNOSIS — F331 Major depressive disorder, recurrent, moderate: Secondary | ICD-10-CM | POA: Diagnosis not present

## 2023-07-12 DIAGNOSIS — F1320 Sedative, hypnotic or anxiolytic dependence, uncomplicated: Secondary | ICD-10-CM | POA: Diagnosis not present

## 2023-07-12 DIAGNOSIS — G4700 Insomnia, unspecified: Secondary | ICD-10-CM | POA: Diagnosis not present

## 2023-07-12 DIAGNOSIS — F411 Generalized anxiety disorder: Secondary | ICD-10-CM

## 2023-07-12 MED ORDER — PAROXETINE HCL 20 MG PO TABS
20.0000 mg | ORAL_TABLET | Freq: Every day | ORAL | 1 refills | Status: DC
Start: 2023-07-12 — End: 2023-08-30

## 2023-07-12 MED ORDER — CLONAZEPAM 0.5 MG PO TABS: 0.2500 mg | ORAL_TABLET | ORAL | 0 refills | Status: DC

## 2023-07-12 NOTE — Progress Notes (Unsigned)
 [facility] MD OP Progress Note  07/12/2023 12:53 PM Nathan Meyer  MRN:  [id number]  Chief Complaint:  Chief Complaint  Patient presents with   Follow-up   Anxiety   Depression   Medication Refill    Discussed the use of AI scribe software for clinical note transcription with the patient, who gave verbal consent to proceed.  History of Present Illness Nathan Meyer is a 63 year old Caucasian male lives with girlfriend in [place], has a history of MDD, GAD, insomnia, chronic pain was evaluated in office today.  He has ongoing issues with anxiety and depression. He is currently taking Paxil  in the morning, which increases his appetite, similar to his experience with Seroquel. He is concerned about weight gain due to his pre-diabetic status and is managing his diet by eating fruits.  He previously experienced dry mouth, tingling in the ear, and facial flushing. He uses biotin and probiotics, which have helped with the dry mouth. Discontinuing Cymbalta  has reduced the severity of his dry mouth. He continues to experience some flushing but notes improvement since his last visit.  He describes severe sciatic pain, which he rates as 'awful.' He has been doing stretches and physical therapy, but the pain persists, especially in the mornings. He mentions temporary relief on Monday, which allowed him to attend a family event, but the pain returned shortly after.  He has upcoming appointment with neurosurgery and looks forward to that.  Sleep continues to be a problem mostly due to pain.  Does have trazodone  which helps to some extent.  He is agreeable to transitioning from Xanax  to Klonopin, taking 0.5 mg of Klonopin, which he can split into two doses if needed.  Denies any suicidality, homicidality or perceptual disturbances.   Visit Diagnosis:    ICD-10-CM   1. MDD (major depressive disorder), recurrent episode, moderate (HCC)  F33.1 PARoxetine  (PAXIL ) 20 MG tablet    2. GAD  (generalized anxiety disorder)  F41.1 PARoxetine  (PAXIL ) 20 MG tablet    clonazePAM (KLONOPIN) 0.5 MG tablet    3. Insomnia, unspecified type  G47.00     4. Benzodiazepine dependence (HCC)  F13.20 clonazePAM (KLONOPIN) 0.5 MG tablet      Past Psychiatric History: I have reviewed past psychiatric history from progress note on 03/20/2023.  Past trials of medications like Zoloft, fluoxetine , Cymbalta .  Past Medical History:  Past Medical History:  Diagnosis Date   Anxiety    Arthritis    Cyst, kidney, acquired    Depression    GERD (gastroesophageal reflux disease)    Hyperlipidemia    Neuromuscular disorder (HCC)    Spine degeneration    Substance abuse (HCC)     Past Surgical History:  Procedure Laterality Date   ANTERIOR FUSION CERVICAL SPINE  1995   finger debridement      Family Psychiatric History: I have reviewed family psychiatric history from progress note on 03/20/2023.  Family History:  Family History  Problem Relation Age of Onset   Arthritis Mother    Hypertension Mother    Hyperlipidemia Mother    Anxiety disorder Mother    Depression Father    COPD Father    Alcohol abuse Maternal Uncle    Alcohol abuse Paternal Uncle    Alcohol abuse Paternal Grandfather    Alcohol abuse Cousin     Social History: I have reviewed social history from progress note on 03/20/2023. Social History   Socioeconomic History   Marital status: Divorced  Spouse name: Not on file   Number of children: 0   Years of education: Not on file   Highest education level: High school graduate  Occupational History   Not on file  Tobacco Use   Smoking status: Former    Current packs/day: 0.00    Average packs/day: 1 pack/day for 38.3 years (38.3 ttl pk-yrs)    Types: Cigarettes    Start date: 36    Quit date: 05/29/2021    Years since quitting: 2.1   Smokeless tobacco: Never  Vaping Use   Vaping status: Never Used  Substance and Sexual Activity   Alcohol use: No   Drug use:  Not Currently    Comment: former substance abuser - cocaine, pills, marijuana   Sexual activity: Not Currently  Other Topics Concern   Not on file  Social History Narrative   Not on file   Social Drivers of Health   Financial Resource Strain: Not on file  Food Insecurity: Not on file  Transportation Needs: Not on file  Physical Activity: Not on file  Stress: Not on file  Social Connections: Not on file    Allergies:  Allergies  Allergen Reactions   Nsaids Nausea And Vomiting and Anaphylaxis    Only in very high doses   Prednisone Anaphylaxis   Tolmetin Nausea And Vomiting    Only in very high doses    Metabolic Disorder Labs: Lab Results  Component Value Date   HGBA1C 5.6 06/21/2023   No results found for: "PROLACTIN" Lab Results  Component Value Date   CHOL 145 06/21/2023   TRIG 162 (H) 06/21/2023   HDL 33 (L) 06/21/2023   CHOLHDL 4.4 06/21/2023   LDLCALC 84 06/21/2023   LDLCALC 75 12/15/2022   Lab Results  Component Value Date   TSH 1.860 05/17/2021    Therapeutic Level Labs: No results found for: "LITHIUM" No results found for: "VALPROATE" No results found for: "CBMZ"  Current Medications: Current Outpatient Medications  Medication Sig Dispense Refill   atorvastatin  (LIPITOR) 20 MG tablet Take 1 tablet (20 mg total) by mouth at bedtime. 90 tablet 3   cetirizine (ZYRTEC) 10 MG chewable tablet Chew 10 mg by mouth.     Cholecalciferol (VITAMIN D3 PO) Take by mouth.     clonazePAM (KLONOPIN) 0.5 MG tablet Take 0.5-1 tablets (0.25-0.5 mg total) by mouth as directed. Take half tablet daily morning and half tablet daily as needed for severe anxiety .STOP ALPRAZOLAM  30 tablet 0   Cyanocobalamin  (B-12 PO) Take by mouth.     hydrochlorothiazide  (MICROZIDE ) 12.5 MG capsule Take 1 capsule (12.5 mg total) by mouth every morning. 90 capsule 3   irbesartan  (AVAPRO ) 75 MG tablet Take 1 tablet (75 mg total) by mouth daily. 90 tablet 3   ketoconazole  (NIZORAL ) 2 %  shampoo apply three times per week, massage into scalp and leave in for at least 5 minutes before rinsing out 120 mL 11   lansoprazole  (PREVACID ) 15 MG capsule TAKE 1 CAPSULE BY MOUTH ONCE DAILY 90 capsule 3   Magnesium Gluconate 550 MG TABS Take by mouth.     metroNIDAZOLE  (METROCREAM ) 0.75 % cream Apply twice daily to affected areas of face 45 g 11   Multiple Vitamin (MULTIVITAMIN ADULT PO) Take by mouth.     oxyCODONE -acetaminophen  (PERCOCET) 7.5-325 MG tablet Take 1 tablet by mouth every 8 (eight) hours as needed for severe pain (pain score 7-10). 90 tablet 0   PARoxetine  (PAXIL ) 20 MG tablet Take  1 tablet (20 mg total) by mouth daily. 30 tablet 1   SYRINGE-NEEDLE, DISP, 3 ML (B-D SYRINGE/NEEDLE 3CC/23GX1") 23G X 1" 3 ML MISC Use 1 syringe with needle every 21 days; To be used for IM testosterone  injection. 50 each 0   testosterone  cypionate (DEPOTESTOSTERONE CYPIONATE) 200 MG/ML injection Inject 0.5 mLs (100 mg total) into the muscle every 21 days. 10 mL 0   tiZANidine  (ZANAFLEX ) 2 MG tablet TAKE 1 TABLET BY MOUTH EVERY 6 HOURS AS NEEDED FOR MUSCLE SPASM **NO MORE THAN 4 TABS IN 24 HOURS** 120 tablet 1   traZODone  (DESYREL ) 100 MG tablet Take 1-2 tablets (100-200 mg total) by mouth at bedtime. Takes (2) 100mg  tabs every night 180 tablet 3   valsartan (DIOVAN) 80 MG tablet Take 80 mg by mouth.     No current facility-administered medications for this visit.     Musculoskeletal: Strength & Muscle Tone: within normal limits Gait & Station: normal Patient leans: N/A  Psychiatric Specialty Exam: Review of Systems  Psychiatric/Behavioral:  Positive for dysphoric mood and sleep disturbance. The patient is nervous/anxious.     Blood pressure 112/78, pulse (!) 103, temperature 97.6 F (36.4 C), temperature source Temporal, height 6' (1.829 m), weight 210 lb 6.4 oz (95.4 kg).Body mass index is 28.54 kg/m.  General Appearance: Casual  Eye Contact:  Fair  Speech:  Clear and Coherent  Volume:   Normal  Mood:  Anxious and Depressed some improvement  Affect:  Congruent  Thought Process:  Goal Directed and Descriptions of Associations: Intact  Orientation:  Full (Time, Place, and Person)  Thought Content: Logical   Suicidal Thoughts:  No  Homicidal Thoughts:  No  Memory:  Immediate;   Fair Recent;   Fair Remote;   Fair  Judgement:  Fair  Insight:  Fair  Psychomotor Activity:  Normal  Concentration:  Concentration: Fair and Attention Span: Fair  Recall:  [employer] of Knowledge: Fair  Language: Fair  Akathisia:  No  Handed:  Right  AIMS (if indicated): not done  Assets:  Housing Runner, broadcasting/film/video  ADL's:  Intact  Cognition: WNL  Sleep:  Restless due to pain   Screenings: GAD-7    Loss adjuster, chartered Office Visit from 07/12/2023 in [facility] Office Visit from 04/27/2023 in [facility] Office Visit from 03/20/2023 in [facility]  Total GAD-7 Score 16 13 13       Mini-Mental    Flowsheet Row Office Visit from 06/15/2023 in [facility], [facility] Office Visit from 06/14/2022 in [facility], [facility] Office Visit from 06/09/2021 in [facility], [facility]  Total Score (max 30 points ) 30 30 30       PHQ2-9    Flowsheet Row Office Visit from 07/12/2023 in [facility] Office Visit from 04/27/2023 in [facility] Office Visit from 03/20/2023 in [facility] Office Visit from 03/07/2022 in [facility], [facility] Office Visit from 11/08/2021 in [facility], [facility]  PHQ-2 Total Score 4 4 0 6 6  PHQ-9 Total Score 18 17 14 18 16       Flowsheet Row Office Visit from 07/12/2023 in [facility] Office Visit from 06/28/2023 in [facility] Office Visit from 06/20/2023 in [facility]  C-SSRS RISK CATEGORY No Risk No Risk No Risk  Assessment and Plan: CEASAR DECANDIA is a 63 year old Caucasian male with history of depression, anxiety was evaluated in office today for follow-up.  Discussed assessment and plan as noted below.  Major depression disorder-some improvement Continues to struggle with mood symptoms although with some improvement on the Paxil .  Reports increased appetite on the Paxil  however is currently managing by watching his diet. - Increase Paxil  to 20 mg daily - Discussed referral for CBT, declined.   Generalized anxiety disorder-some improvement Currently with some improvement on the Paxil  although continues to struggle with pain which does affect his mood.  He is also trying to wean off Xanax  under supervision which also causes episodic anxiety.  Agreeable to changing Xanax  to clonazepam which is more long-acting. - Discontinue Alprazolam /Xanax . - Start Clonazepam 0.25 mg daily in the morning and 0.25 mg as needed during the day for severe anxiety - Increase Paxil  to 20 mg daily - Reviewed [place] PMP AWARxE  Insomnia-unstable Sleep problems are due to current pain. - Continue Trazodone  100-200 mg at bedtime as needed - Could add low dosage of Melatonin. - Will need sufficient pain management and has upcoming appointment with the pain provider.  Benzodiazepine dependence-improving Currently on a lower dosage of Xanax  and agreeable to switching to Clonazepam to help with weaning process. - Discontinue Xanax . - Start Clonazepam 0.25 mg in the morning and 0.25 mg daily as needed.  Follow-up Follow-up in clinic in 4 weeks or sooner if needed.   Collaboration of Care: Collaboration of Care: Other patient encouraged to establish care with pain provider.  Patient/Guardian was advised Release of Information must be obtained prior to any record release in  order to collaborate their care with an outside provider. Patient/Guardian was advised if they have not already done so to contact the registration department to sign all necessary forms in order for us  to release information regarding their care.   Consent: Patient/Guardian gives verbal consent for treatment and assignment of benefits for services provided during this visit. Patient/Guardian expressed understanding and agreed to proceed.   This note was generated in part or whole with voice recognition software. Voice recognition is usually quite accurate but there are transcription errors that can and very often do occur. I apologize for any typographical errors that were not detected and corrected.    Jamion Carter, MD 07/14/2023, 7:55 AM

## 2023-07-12 NOTE — Patient Instructions (Signed)
 Clonazepam Tablets What is this medication? CLONAZEPAM (kloe NA ze pam) treats seizures. It may also be used to treat panic disorder. It works by Education administrator system calm down. It belongs to a group of medications called benzodiazepines. This medicine may be used for other purposes; ask your health care provider or pharmacist if you have questions. COMMON BRAND NAME(S): Ceberclon, Klonopin What should I tell my care team before I take this medication? They need to know if you have any of these conditions: Bipolar disorder, depression, psychosis, or other mental health conditions Glaucoma Kidney disease Liver disease Lung or breathing disease Myasthenia gravis Parkinson disease Porphyria Seizures or a history of seizures Substance use disorder Suicidal thoughts, plans, or attempt by you or a family member An unusual or allergic reaction to clonazepam, other medications, foods, dyes, or preservatives Pregnant or trying to get pregnant Breastfeeding How should I use this medication? Take this medication by mouth with water. Take it as directed on the prescription label at the same time every day. You can take it with or without food. If it upsets your stomach, take it with food. Do not take it more often than directed. Do not stop taking or change the dose except on the advice of your care team. A special MedGuide will be given to you by the pharmacist with each prescription and refill. Be sure to read this information carefully each time. Talk to your care team about the use of this medication in children. Special care may be needed. Overdosage: If you think you have taken too much of this medicine contact a poison control center or emergency room at once. NOTE: This medicine is only for you. Do not share this medicine with others. What if I miss a dose? If you miss a dose, take it as soon as you can. If it is almost time for your next dose, take only that dose. Do not take double  or extra doses. What may interact with this medication? Do not take this medication with any of the following: Opioid medications for cough Sodium oxybate This medication may also interact with the following: Alcohol Antihistamines for allergy, cough, and cold Antiviral medications for HIV or AIDS Certain medications for anxiety or sleep Certain medications for depression, such as amitriptyline, fluoxetine, sertraline Certain medications for fungal infections, such as ketoconazole and itraconazole Certain medications for seizures, such as carbamazepine, phenobarbital, phenytoin, primidone General anesthetics, such as halothane, isoflurane, methoxyflurane, propofol Local anesthetics, such as lidocaine, pramoxine, tetracaine Medications that relax muscles for surgery Opioid medications for pain Phenothiazines, such as chlorpromazine, mesoridazine, prochlorperazine, thioridazine This list may not describe all possible interactions. Give your health care provider a list of all the medicines, herbs, non-prescription drugs, or dietary supplements you use. Also tell them if you smoke, drink alcohol, or use illegal drugs. Some items may interact with your medicine. What should I watch for while using this medication? Visit your care team for regular checks on your progress. Tell your care team if your symptoms do not start to get better or if they get worse. Do not suddenly stop taking this medication. You may develop a severe reaction. Your care team will tell you how much medication to take. If your care team wants you to stop the medication, the dose may be slowly lowered over time to avoid any side effects. This medication may affect your coordination, reaction time, or judgment. Do not drive or operate machinery until you know how this medication affects you.  Sit up or stand slowly to reduce the risk of dizzy or fainting spells. Drinking alcohol with this medication can increase the risk of these  side effects. If you are taking another medication that also causes drowsiness, you may have more side effects. Give your care team a list of all medications you use. Your care team will tell you how much medication to take. Do not take more medication than directed. Get emergency help right away if you have problems breathing or unusual sleepiness. If you or your family notice any changes in your behavior, such as new or worsening depression, thoughts of harming yourself, anxiety, other unusual or disturbing thoughts, or memory loss, call your care team right away. Talk to your care team if you wish to become pregnant or think you might be pregnant. This medication can cause serious birth defects if taken during pregnancy. Talk to your care team before breastfeeding. Changes to your treatment plan may be needed. What side effects may I notice from receiving this medication? Side effects that you should report to your care team as soon as possible: Allergic reactions--skin rash, itching, hives, swelling of the face, lips, tongue, or throat CNS depression--slow or shallow breathing, shortness of breath, feeling faint, dizziness, confusion, trouble staying awake Thoughts of suicide or self-harm, worsening mood, feelings of depression Side effects that usually do not require medical attention (report to your care team if they continue or are bothersome): Dizziness Drowsiness Headache This list may not describe all possible side effects. Call your doctor for medical advice about side effects. You may report side effects to FDA at 1-800-FDA-1088. Where should I keep my medication? Keep out of the reach of children and pets. This medication can be abused. Keep your medication in a safe place to protect it from theft. Do not share this medication with anyone. Selling or giving away this medication is dangerous and against the law. Store at room temperature between 15 and 30 degrees C (59 and 86 degrees F).  Protect from light. Keep container tightly closed. This medication may cause accidental overdose and death if taken by other adults, children, or pets. Mix any unused medication with a substance like cat litter or coffee grounds. Then throw the medication away in a sealed container like a sealed bag or a coffee can with a lid. Do not use the medication after the expiration date. NOTE: This sheet is a summary. It may not cover all possible information. If you have questions about this medicine, talk to your doctor, pharmacist, or health care provider.  2024 Elsevier/Gold Standard (2021-10-27 00:00:00)

## 2023-07-13 ENCOUNTER — Encounter: Payer: Self-pay | Admitting: Nurse Practitioner

## 2023-07-13 DIAGNOSIS — M47816 Spondylosis without myelopathy or radiculopathy, lumbar region: Secondary | ICD-10-CM

## 2023-07-13 DIAGNOSIS — M5416 Radiculopathy, lumbar region: Secondary | ICD-10-CM

## 2023-07-13 DIAGNOSIS — G8929 Other chronic pain: Secondary | ICD-10-CM

## 2023-07-17 ENCOUNTER — Ambulatory Visit
Admission: RE | Admit: 2023-07-17 | Discharge: 2023-07-17 | Disposition: A | Discharge status: Home / Self Care | Source: Ambulatory Visit | Attending: Nurse Practitioner | Admitting: Nurse Practitioner

## 2023-07-17 DIAGNOSIS — Z122 Encounter for screening for malignant neoplasm of respiratory organs: Secondary | ICD-10-CM | POA: Diagnosis not present

## 2023-07-17 DIAGNOSIS — I7121 Aneurysm of the ascending aorta, without rupture: Secondary | ICD-10-CM | POA: Diagnosis not present

## 2023-07-17 DIAGNOSIS — Z87891 Personal history of nicotine dependence: Secondary | ICD-10-CM

## 2023-07-17 DIAGNOSIS — R911 Solitary pulmonary nodule: Secondary | ICD-10-CM | POA: Diagnosis not present

## 2023-07-18 MED ORDER — OXYCODONE-ACETAMINOPHEN 7.5-325 MG PO TABS: 1.0000 | ORAL_TABLET | Freq: Three times a day (TID) | ORAL | 0 refills | Status: DC | PRN

## 2023-07-18 NOTE — Telephone Encounter (Signed)
 Spoke to patient inform him of the message from provider he voiced understanding

## 2023-07-22 ENCOUNTER — Ambulatory Visit
Admission: RE | Admit: 2023-07-22 | Discharge: 2023-07-22 | Disposition: A | Discharge status: Home / Self Care | Source: Ambulatory Visit | Attending: Nurse Practitioner | Admitting: Nurse Practitioner

## 2023-07-22 DIAGNOSIS — M5416 Radiculopathy, lumbar region: Secondary | ICD-10-CM

## 2023-07-22 DIAGNOSIS — M5126 Other intervertebral disc displacement, lumbar region: Secondary | ICD-10-CM | POA: Diagnosis not present

## 2023-07-22 DIAGNOSIS — M48061 Spinal stenosis, lumbar region without neurogenic claudication: Secondary | ICD-10-CM | POA: Diagnosis not present

## 2023-07-22 DIAGNOSIS — M47816 Spondylosis without myelopathy or radiculopathy, lumbar region: Secondary | ICD-10-CM

## 2023-07-22 DIAGNOSIS — M2578 Osteophyte, vertebrae: Secondary | ICD-10-CM | POA: Diagnosis not present

## 2023-07-22 DIAGNOSIS — G8929 Other chronic pain: Secondary | ICD-10-CM

## 2023-07-25 ENCOUNTER — Other Ambulatory Visit

## 2023-07-27 ENCOUNTER — Telehealth: Payer: Self-pay

## 2023-07-27 ENCOUNTER — Ambulatory Visit (INDEPENDENT_AMBULATORY_CARE_PROVIDER_SITE_OTHER): Admitting: Nurse Practitioner

## 2023-07-27 ENCOUNTER — Encounter: Payer: Self-pay | Admitting: Nurse Practitioner

## 2023-07-27 VITALS — BP 130/72 | HR 98 | Temp 98.0°F | Resp 16 | Ht 72.0 in | Wt 206.0 lb

## 2023-07-27 DIAGNOSIS — M5442 Lumbago with sciatica, left side: Secondary | ICD-10-CM

## 2023-07-27 DIAGNOSIS — M5441 Lumbago with sciatica, right side: Secondary | ICD-10-CM | POA: Diagnosis not present

## 2023-07-27 DIAGNOSIS — M47816 Spondylosis without myelopathy or radiculopathy, lumbar region: Secondary | ICD-10-CM | POA: Diagnosis not present

## 2023-07-27 DIAGNOSIS — R918 Other nonspecific abnormal finding of lung field: Secondary | ICD-10-CM

## 2023-07-27 DIAGNOSIS — M5416 Radiculopathy, lumbar region: Secondary | ICD-10-CM | POA: Diagnosis not present

## 2023-07-27 DIAGNOSIS — I77810 Thoracic aortic ectasia: Secondary | ICD-10-CM | POA: Diagnosis not present

## 2023-07-27 DIAGNOSIS — G8929 Other chronic pain: Secondary | ICD-10-CM

## 2023-07-27 DIAGNOSIS — Z87891 Personal history of nicotine dependence: Secondary | ICD-10-CM

## 2023-07-27 DIAGNOSIS — E291 Testicular hypofunction: Secondary | ICD-10-CM

## 2023-07-27 MED ORDER — TESTOSTERONE CYPIONATE 200 MG/ML IM SOLN: INTRAMUSCULAR | 0 refills | Status: DC

## 2023-07-27 MED ORDER — OXYCODONE-ACETAMINOPHEN 7.5-325 MG PO TABS: 1.0000 | ORAL_TABLET | Freq: Three times a day (TID) | ORAL | 0 refills | Status: DC | PRN

## 2023-07-27 MED ORDER — TIZANIDINE HCL 2 MG PO TABS
ORAL_TABLET | ORAL | 1 refills | Status: DC
Start: 2023-07-27 — End: 2023-11-16

## 2023-07-27 NOTE — Telephone Encounter (Signed)
 Pt had appt today.

## 2023-07-27 NOTE — Progress Notes (Signed)
 [facility] [address] [place], [place] [address]  Internal MEDICINE  Office Visit Note  Patient Name: Nathan Meyer  [id number]  [id number]  Date of Service: 07/27/2023  Chief Complaint  Patient presents with   Follow-up    Review mri, ct and labs    HPI Dondi presents for a follow-up visit for CT chest results, MRI lumbar spine results, and lab results.  Chronic low back pain -- MRI lumbar spine shows significant degenerative changes as seen below in the results. Patient has been approached about back surgery in the past and is realizing that he may need to consider this again.  Lung cancer screening was done and result was suspicious with 1 8 mm nodule and another that is >19mm. Recommendation is for repeat low dose CT chest in 3 months but a PET scan can be considered if high risk. He quit smoking 2 years ago.  4.1 cm thoracic aortic aneurysm seen on CT chest 2.6 cm simple cyst of the left kidney -- no follow up imaging recommended.  Cholesterol -- slightly elevated triglycerides, low HDL, normal LDL Slightly elevated glucose of 109 and A1c is 5.6  TRT -- testosterone  in normal range. CBC is normal, liver function tests are normal.    MRI Lumbar spine results: -- Degenerative changes as above. Mild-to-moderate spinal canal stenosis and lateral recess narrowing at L4-5 with possible impingement upon the traversing nerve roots. -- Extraforaminal component of disc protrusion at L4-5 likely contacts the right L4 nerve root. -- Additional disc bulge at L3-4 resulting in lateral recess narrowing with possible impingement upon the traversing nerve roots. -- Disc bulge and posterior osteophytes at L5-S1 with lateral recess narrowing and possible impingement upon the traversing right S1 nerve roots. -- Multilevel foraminal stenosis, greatest and severe on the right at L4-5.  Low CT chest LCS results:  --Lungs/Pleura: Centrilobular and paraseptal emphysema  evident. --Scattered bilateral pulmonary nodules evident including 8.2 mm perifissural lateral right lung nodule, likely right middle lobe although minor fissure difficult to identified on sagittal imaging. 8.0 mm peripheral left lower lobe nodule seen on image 198. --Additional scattered smaller bilateral pulmonary nodules evident. No focal airspace consolidation. No pleural effusion. --Coronary artery calcification is evident. Mild atherosclerotic calcification is noted in the wall of the thoracic aorta.  --Ascending thoracic aorta measures 4.1 cm diameter. --Enlargement of the pulmonary outflow tract/main pulmonary arteries suggests pulmonary arterial hypertension. --2.6 cm water density lesion identified upper pole left kidney compatible with a simple cyst as reported by abdominal ultrasound 08/07/2020. No followup imaging is recommended. --IMPRESSION: Lung-RADS 4A, suspicious. 8.2 mm perifissural lateral right lung nodule with 8.0 mm peripheral left lower lobe nodule. Follow up low-dose chest CT without contrast in 3 months. Alternatively, PET may be considered when there is a solid component 8mm or larger.   Current Medication: Outpatient Encounter Medications as of 07/27/2023  Medication Sig   atorvastatin  (LIPITOR) 20 MG tablet Take 1 tablet (20 mg total) by mouth at bedtime.   cetirizine (ZYRTEC) 10 MG chewable tablet Chew 10 mg by mouth.   Cholecalciferol (VITAMIN D3 PO) Take by mouth.   clonazePAM  (KLONOPIN ) 0.5 MG tablet Take 0.5-1 tablets (0.25-0.5 mg total) by mouth as directed. Take half tablet daily morning and half tablet daily as needed for severe anxiety .STOP ALPRAZOLAM    Cyanocobalamin  (B-12 PO) Take by mouth.   hydrochlorothiazide  (MICROZIDE ) 12.5 MG capsule Take 1 capsule (12.5 mg total) by mouth every morning.   irbesartan  (AVAPRO ) 75  MG tablet Take 1 tablet (75 mg total) by mouth daily.   ketoconazole  (NIZORAL ) 2 % shampoo apply three times per week, massage  into scalp and leave in for at least 5 minutes before rinsing out   lansoprazole  (PREVACID ) 15 MG capsule TAKE 1 CAPSULE BY MOUTH ONCE DAILY   Magnesium Gluconate 550 MG TABS Take by mouth.   metroNIDAZOLE  (METROCREAM ) 0.75 % cream Apply twice daily to affected areas of face   Multiple Vitamin (MULTIVITAMIN ADULT PO) Take by mouth.   [START ON 08/22/2023] oxyCODONE -acetaminophen  (PERCOCET) 7.5-325 MG tablet Take 1 tablet by mouth every 8 (eight) hours as needed for severe pain (pain score 7-10).   PARoxetine  (PAXIL ) 20 MG tablet Take 1 tablet (20 mg total) by mouth daily.   SYRINGE-NEEDLE, DISP, 3 ML (B-D SYRINGE/NEEDLE 3CC/23GX1) 23G X 1 3 ML MISC Use 1 syringe with needle every 21 days; To be used for IM testosterone  injection.   testosterone  cypionate (DEPOTESTOSTERONE CYPIONATE) 200 MG/ML injection Inject 0.5 mLs (100 mg total) into the muscle every 21 days.   tiZANidine  (ZANAFLEX ) 2 MG tablet TAKE 1 TABLET BY MOUTH EVERY 6 HOURS AS NEEDED FOR MUSCLE SPASM **NO MORE THAN 4 TABS IN 24 HOURS**   traZODone  (DESYREL ) 100 MG tablet Take 1-2 tablets (100-200 mg total) by mouth at bedtime. Takes (2) 100mg  tabs every night   valsartan (DIOVAN) 80 MG tablet Take 80 mg by mouth.   [DISCONTINUED] oxyCODONE -acetaminophen  (PERCOCET) 7.5-325 MG tablet Take 1 tablet by mouth every 8 (eight) hours as needed for severe pain (pain score 7-10).   [DISCONTINUED] testosterone  cypionate (DEPOTESTOSTERONE CYPIONATE) 200 MG/ML injection Inject 0.5 mLs (100 mg total) into the muscle every 21 days.   [DISCONTINUED] tiZANidine  (ZANAFLEX ) 2 MG tablet TAKE 1 TABLET BY MOUTH EVERY 6 HOURS AS NEEDED FOR MUSCLE SPASM **NO MORE THAN 4 TABS IN 24 HOURS**   No facility-administered encounter medications on file as of 07/27/2023.    Surgical History: Past Surgical History:  Procedure Laterality Date   ANTERIOR FUSION CERVICAL SPINE  02/14/1993   finger debridement      Medical History: Past Medical History:  Diagnosis  Date   Anxiety    Arthritis    Cyst, kidney, acquired    Depression    GERD (gastroesophageal reflux disease)    Hyperlipidemia    Neuromuscular disorder (HCC)    Spine degeneration    Substance abuse (HCC)     Family History: Family History  Problem Relation Age of Onset   Arthritis Mother    Hypertension Mother    Hyperlipidemia Mother    Anxiety disorder Mother    Depression Father    COPD Father    Alcohol abuse Maternal Uncle    Alcohol abuse Paternal Uncle    Alcohol abuse Paternal Grandfather    Alcohol abuse Cousin     Social History   Socioeconomic History   Marital status: Divorced    Spouse name: Not on file   Number of children: 0   Years of education: Not on file   Highest education level: High school graduate  Occupational History   Not on file  Tobacco Use   Smoking status: Former    Current packs/day: 0.00    Average packs/day: 1 pack/day for 38.3 years (38.3 ttl pk-yrs)    Types: Cigarettes    Start date: 16    Quit date: 05/29/2021    Years since quitting: 2.1   Smokeless tobacco: Never  Vaping Use   Vaping  status: Never Used  Substance and Sexual Activity   Alcohol use: No   Drug use: Not Currently    Comment: former substance abuser - cocaine, pills, marijuana   Sexual activity: Not Currently  Other Topics Concern   Not on file  Social History Narrative   Not on file   Social Drivers of Health   Financial Resource Strain: Not on file  Food Insecurity: Not on file  Transportation Needs: Not on file  Physical Activity: Not on file  Stress: Not on file  Social Connections: Not on file  Intimate Partner Violence: Not on file      Review of Systems  Constitutional:  Positive for activity change and fatigue. Negative for chills and unexpected weight change.  HENT:  Negative for congestion, rhinorrhea, sneezing and sore throat.   Eyes:  Negative for redness.  Respiratory:  Positive for shortness of breath (intermittent and  mild). Negative for cough, chest tightness and wheezing.   Cardiovascular: Negative.  Negative for chest pain and palpitations.  Gastrointestinal:  Negative for abdominal pain, constipation, diarrhea, nausea and vomiting.  Genitourinary:  Negative for dysuria and frequency.  Musculoskeletal:  Positive for arthralgias and back pain. Negative for joint swelling and neck pain.  Neurological: Negative.  Negative for tremors and numbness.  Hematological:  Negative for adenopathy. Does not bruise/bleed easily.  Psychiatric/Behavioral:  Positive for behavioral problems (Depression) and decreased concentration. Negative for self-injury, sleep disturbance and suicidal ideas. The patient is nervous/anxious.     Vital Signs: BP 130/72   Pulse (!) 107   Temp 98 F (36.7 C)   Resp 16   Ht 6' (1.829 m)   Wt 206 lb (93.4 kg)   SpO2 95%   BMI 27.94 kg/m    Physical Exam Vitals reviewed.  Constitutional:      General: He is not in acute distress.    Appearance: Normal appearance. He is obese. He is not ill-appearing.  HENT:     Head: Normocephalic and atraumatic.   Eyes:     Pupils: Pupils are equal, round, and reactive to light.    Cardiovascular:     Rate and Rhythm: Normal rate and regular rhythm.  Pulmonary:     Effort: Pulmonary effort is normal. No respiratory distress.   Skin:    Capillary Refill: Capillary refill takes less than 2 seconds.   Neurological:     Mental Status: He is alert and oriented to person, place, and time.   Psychiatric:        Mood and Affect: Mood normal.        Behavior: Behavior normal.        Assessment/Plan: 1. Pulmonary nodules/lesions, multiple (Primary) Repeat CT chest in 3 months. Patient instructed to call sooner if there is any change in his symptoms such as increased SOB, wheezing, cough, chest tightness or chest pain. If he experiences any new symptoms, will consider doing CT chest earlier or switching to a PET scan.  - CT CHEST LCS  NODULE F/U LOW DOSE WO CONTRAST; Future  2. Ascending Aorta Dilatation (HCC) 4.1 cm in diameter. Will review follow up CT chest in 3 months to assess for any increase in size.   3. Chronic bilateral low back pain with bilateral sciatica Referred to spine surgery. Continue prn oxycodone  and tizanidine  as prescribed. - Ambulatory referral to Spine Surgery - oxyCODONE -acetaminophen  (PERCOCET) 7.5-325 MG tablet; Take 1 tablet by mouth every 8 (eight) hours as needed for severe pain (pain score 7-10).  Dispense: 90 tablet; Refill: 0 - tiZANidine  (ZANAFLEX ) 2 MG tablet; TAKE 1 TABLET BY MOUTH EVERY 6 HOURS AS NEEDED FOR MUSCLE SPASM **NO MORE THAN 4 TABS IN 24 HOURS**  Dispense: 120 tablet; Refill: 1  4. Lumbar radiculopathy, chronic Referred to spine surgery - Ambulatory referral to Spine Surgery  5. Lumbar facet arthropathy Referred to spine surgery for further evaluation and possible discussion of surgery. Continue prn oxycodone  and tizanidine  as needed.  - Ambulatory referral to Spine Surgery - oxyCODONE -acetaminophen  (PERCOCET) 7.5-325 MG tablet; Take 1 tablet by mouth every 8 (eight) hours as needed for severe pain (pain score 7-10).  Dispense: 90 tablet; Refill: 0 - tiZANidine  (ZANAFLEX ) 2 MG tablet; TAKE 1 TABLET BY MOUTH EVERY 6 HOURS AS NEEDED FOR MUSCLE SPASM **NO MORE THAN 4 TABS IN 24 HOURS**  Dispense: 120 tablet; Refill: 1  6. Hypogonadism in male Continue TRT as prescribed. Will repeat labs in 3-6 months - testosterone  cypionate (DEPOTESTOSTERONE CYPIONATE) 200 MG/ML injection; Inject 0.5 mLs (100 mg total) into the muscle every 21 days.  Dispense: 10 mL; Refill: 0  7. Stopped smoking with greater than 30 pack year history Repeat CT chest ordered to be done in 3 months from now.    General Counseling: Less verbalizes understanding of the findings of todays visit and agrees with plan of treatment. I have discussed any further diagnostic evaluation that may be needed or ordered  today. We also reviewed his medications today. he has been encouraged to call the office with any questions or concerns that should arise related to todays visit.    Orders Placed This Encounter  Procedures   CT CHEST LCS NODULE F/U LOW DOSE WO CONTRAST   Ambulatory referral to Spine Surgery    Meds ordered this encounter  Medications   oxyCODONE -acetaminophen  (PERCOCET) 7.5-325 MG tablet    Sig: Take 1 tablet by mouth every 8 (eight) hours as needed for severe pain (pain score 7-10).    Dispense:  90 tablet    Refill:  0    For future refill   testosterone  cypionate (DEPOTESTOSTERONE CYPIONATE) 200 MG/ML injection    Sig: Inject 0.5 mLs (100 mg total) into the muscle every 21 days.    Dispense:  10 mL    Refill:  0    Fill script on start date with syringes   tiZANidine  (ZANAFLEX ) 2 MG tablet    Sig: TAKE 1 TABLET BY MOUTH EVERY 6 HOURS AS NEEDED FOR MUSCLE SPASM **NO MORE THAN 4 TABS IN 24 HOURS**    Dispense:  120 tablet    Refill:  1    FOR NEXT FILL. THANK YOU.    Return for previously scheduled, F/U, Deangela Randleman PCP in july.   Total time spent:30 Minutes Time spent includes review of chart, medications, test results, and follow up plan with the patient.   [place] Controlled Substance Database was reviewed by me.  This patient was seen by Laurence Pons, FNP-C in collaboration with Dr. Verneta Gone as a part of collaborative care agreement.   Vittoria Noreen R. Bobbi Burow, MSN, FNP-C Internal medicine

## 2023-07-28 ENCOUNTER — Telehealth: Payer: Self-pay | Admitting: Nurse Practitioner

## 2023-07-28 ENCOUNTER — Encounter: Payer: Self-pay | Admitting: Nurse Practitioner

## 2023-07-28 DIAGNOSIS — M5416 Radiculopathy, lumbar region: Secondary | ICD-10-CM | POA: Insufficient documentation

## 2023-07-28 DIAGNOSIS — R918 Other nonspecific abnormal finding of lung field: Secondary | ICD-10-CM | POA: Insufficient documentation

## 2023-07-28 DIAGNOSIS — Z87891 Personal history of nicotine dependence: Secondary | ICD-10-CM | POA: Insufficient documentation

## 2023-07-28 DIAGNOSIS — I7121 Aneurysm of the ascending aorta, without rupture: Secondary | ICD-10-CM | POA: Insufficient documentation

## 2023-07-28 DIAGNOSIS — I77810 Thoracic aortic ectasia: Secondary | ICD-10-CM | POA: Insufficient documentation

## 2023-07-28 NOTE — Telephone Encounter (Signed)
 Spine Surgery referral faxed to Dr. Ellery Guthrie w/ [facility] per patient request; [phone]. Notified patient. He already has their telephone #-Toni

## 2023-07-30 ENCOUNTER — Encounter: Payer: Self-pay | Admitting: Nurse Practitioner

## 2023-08-12 ENCOUNTER — Other Ambulatory Visit: Payer: Self-pay | Admitting: Psychiatry

## 2023-08-12 DIAGNOSIS — F411 Generalized anxiety disorder: Secondary | ICD-10-CM

## 2023-08-12 DIAGNOSIS — F331 Major depressive disorder, recurrent, moderate: Secondary | ICD-10-CM

## 2023-08-28 ENCOUNTER — Ambulatory Visit: Admitting: Dermatology

## 2023-08-28 DIAGNOSIS — D492 Neoplasm of unspecified behavior of bone, soft tissue, and skin: Secondary | ICD-10-CM | POA: Diagnosis not present

## 2023-08-28 DIAGNOSIS — L723 Sebaceous cyst: Secondary | ICD-10-CM

## 2023-08-28 DIAGNOSIS — L720 Epidermal cyst: Secondary | ICD-10-CM | POA: Diagnosis not present

## 2023-08-28 NOTE — Patient Instructions (Signed)

## 2023-08-28 NOTE — Progress Notes (Signed)
 Follow-Up Visit   Subjective  Nathan Meyer is a 63 y.o. male who presents for the following: Cyst of the R lower post neck , excise today. Also irritated cyst at right temple he would like removed.   The following portions of the chart were reviewed this encounter and updated as appropriate: medications, allergies, medical history  Review of Systems:  No other skin or systemic complaints except as noted in HPI or Assessment and Plan.  Objective  Well appearing patient in no apparent distress; mood and affect are within normal limits.  A focused examination was performed of the following areas: Face  Relevant physical exam findings are noted in the Assessment and Plan.  R lower post neck 1.4cm sq nodule R lower temple/lat eye Smooth white papule 0.6cm  Assessment & Plan   NEOPLASM OF SKIN R lower post neck Skin excision  Lesion length (cm):  1.4 Lesion width (cm):  1.4 Margin per side (cm):  0.1 Total excision diameter (cm):  1.6 Informed consent: discussed and consent obtained   Timeout: patient name, date of birth, surgical site, and procedure verified   Procedure prep:  Patient was prepped and draped in usual sterile fashion Prep type:  Povidone-iodine Anesthesia: the lesion was anesthetized in a standard fashion   Anesthetic:  1% lidocaine w/ epinephrine 1-100,000 buffered w/ 8.4% NaHCO3 (6cc lido with epi, 6cc bupivicaine, Total of 12cc) Instrument used comment:  #15c blade Hemostasis achieved with: pressure and electrodesiccation   Outcome: patient tolerated procedure well with no complications    Skin repair Complexity:  Intermediate Final length (cm):  1.4 Informed consent: discussed and consent obtained   Reason for type of repair: reduce tension to allow closure, reduce the risk of dehiscence, infection, and necrosis, reduce subcutaneous dead space and avoid a hematoma, preserve normal anatomical and functional relationships and enhance both  functionality and cosmetic results   Undermining: edges undermined   Subcutaneous layers (deep stitches):  Suture size:  4-0 Suture type: Vicryl (polyglactin 910)   Subcutaneous suture technique: inverted dermal. Fine/surface layer approximation (top stitches):  Suture size:  4-0 Suture type: nylon   Stitches: simple interrupted   Suture removal (days):  7 Hemostasis achieved with: suture Outcome: patient tolerated procedure well with no complications   Post-procedure details: sterile dressing applied and wound care instructions given   Dressing type: pressure dressing (Mupirocin  ointment)    Specimen 1 - Surgical pathology Differential Diagnosis: Cyst vs other  Check Margins: No 1.4cm sq nodule INFLAMED EPIDERMOID CYST OF SKIN R lower temple/lat eye Discussed may recur Incision and Drainage - R lower temple/lat eye Location: R lower temple/lat eye  Informed Consent: Discussed risks (permanent scarring, light or dark discoloration, infection, pain, bleeding, bruising, redness, damage to adjacent structures, and recurrence of the lesion) and benefits of the procedure, as well as the alternatives.  Informed consent was obtained.  Preparation: The area was prepped with alcohol.  Anesthesia: Bupivacaine 0.5%   Procedure Details: An incision was made overlying the lesion. The lesion drained white, chalky cyst material and blood.  A small amount of fluid was drained.    Antibiotic ointment and a sterile pressure dressing were applied. The patient tolerated procedure well.  Total number of lesions drained: 1  Plan: The patient was instructed on post-op care. Recommend OTC analgesia as needed for pain.     Return in about 1 week (around 09/04/2023) for suture removal.  I, Sonya Hupman, RMA, am acting as Neurosurgeon for [employer]  Jackquline, MD .  I, Andrea Kerns, CMA, am acting as scribe for Rexene Jackquline, MD .   Documentation: I have reviewed the above documentation for accuracy and  completeness, and I agree with the above.  Rexene Jackquline, MD

## 2023-08-29 ENCOUNTER — Telehealth: Payer: Self-pay

## 2023-08-29 NOTE — Telephone Encounter (Signed)
Patient doing well after yesterdays surgery./sh 

## 2023-08-30 ENCOUNTER — Encounter: Payer: Self-pay | Admitting: Psychiatry

## 2023-08-30 ENCOUNTER — Ambulatory Visit (INDEPENDENT_AMBULATORY_CARE_PROVIDER_SITE_OTHER): Admitting: Psychiatry

## 2023-08-30 ENCOUNTER — Other Ambulatory Visit: Payer: Self-pay

## 2023-08-30 VITALS — BP 107/76 | HR 114 | Temp 97.8°F | Ht 72.0 in | Wt 206.0 lb

## 2023-08-30 DIAGNOSIS — F1320 Sedative, hypnotic or anxiolytic dependence, uncomplicated: Secondary | ICD-10-CM

## 2023-08-30 DIAGNOSIS — G4701 Insomnia due to medical condition: Secondary | ICD-10-CM

## 2023-08-30 DIAGNOSIS — F331 Major depressive disorder, recurrent, moderate: Secondary | ICD-10-CM | POA: Diagnosis not present

## 2023-08-30 DIAGNOSIS — F411 Generalized anxiety disorder: Secondary | ICD-10-CM | POA: Diagnosis not present

## 2023-08-30 LAB — SURGICAL PATHOLOGY

## 2023-08-30 MED ORDER — PAROXETINE HCL 20 MG PO TABS: 20.0000 mg | ORAL_TABLET | Freq: Every day | ORAL | 1 refills | Status: DC

## 2023-08-30 MED ORDER — CLONAZEPAM 0.5 MG PO TABS: 0.2500 mg | ORAL_TABLET | ORAL | 0 refills | Status: DC

## 2023-08-30 NOTE — Progress Notes (Unsigned)
 [facility] MD OP Progress Note  08/30/2023 12:57 PM Nathan Meyer  MRN:  [id number]  Chief Complaint:  Chief Complaint  Patient presents with   Follow-up   Anxiety   Depression   Insomnia   Medication Refill   Discussed the use of AI scribe software for clinical note transcription with the patient, who gave verbal consent to proceed.  History of Present Illness Nathan Meyer is a 63 year old Caucasian male lives with girlfriend in [place], has a history of MDD, GAD, insomnia, chronic pain who was evaluated in the office today for a follow-up appointment.  He experiences significant anxiety, worsened by situational stressors such as a lung nodule, epidermoid cyst of skin requiring recent surgery and chronic pain. He is currently on Paxil  and Klonopin  for anxiety management. He is concerned about medication interactions and managing anxiety without benzodiazepines. He is hesitant to increase Paxil  dosage .  Denies thoughts of self-harm or harm to others, and no auditory or visual hallucinations.  He suffers from severe sciatica pain, with MRI findings indicating L3 to L5 vertebrae involvement affecting the nerve. He takes oxycodone , which provides minimal relief, and the pain significantly impacts his ability to get out of bed and alters his personality, causing irritability and anxiety. He has been unable to find a pain management specialist willing to prescribe medication, with most suggesting steroid injections, which he is not interested in.  A recent PET scan of his lungs showed two nodules. He is scheduled for a follow-up CT scan on September 17 to assess for growth, with a potential biopsy to follow. He has a history of cysts on his kidneys, throat, ears, and neck, and recently had a cyst removed from his skin, which is currently causing significant pain.  He experiences sleep disturbances, likely exacerbated by chronic pain. He takes trazodone  200 mg at night for sleep, but pain  significantly disrupts his sleep. He has tried melatonin in the past without success.  He is on disability and faces financial constraints, impacting his ability to seek therapy or additional medical consultations. He has a history of drug use and has been seeing psychiatrists and therapists for many years, with limited success in managing his anxiety.   Visit Diagnosis:    ICD-10-CM   1. MDD (major depressive disorder), recurrent episode, moderate (HCC)  F33.1 PARoxetine  (PAXIL ) 20 MG tablet    2. GAD (generalized anxiety disorder)  F41.1 clonazePAM  (KLONOPIN ) 0.5 MG tablet    PARoxetine  (PAXIL ) 20 MG tablet    3. Insomnia due to medical condition  G47.01    Pain, anxiety    4. Benzodiazepine dependence (HCC)  F13.20 clonazePAM  (KLONOPIN ) 0.5 MG tablet      Past Psychiatric History: I have reviewed past psychiatric history from progress note on 03/20/2023.  Past trials of medications like Zoloft, fluoxetine , Cymbalta .  Past Medical History:  Past Medical History:  Diagnosis Date   Anxiety    Arthritis    Cyst, kidney, acquired    Depression    GERD (gastroesophageal reflux disease)    Hyperlipidemia    Neuromuscular disorder (HCC)    Spine degeneration    Substance abuse [facility])     Past Surgical History:  Procedure Laterality Date   ANTERIOR FUSION CERVICAL SPINE  02/14/1993   finger debridement      Family Psychiatric History: I have reviewed family psychiatric history from progress note: 03/20/2023.  Family History:  Family History  Problem Relation Age of Onset   Arthritis  Mother    Hypertension Mother    Hyperlipidemia Mother    Anxiety disorder Mother    Depression Father    COPD Father    Alcohol abuse Maternal Uncle    Alcohol abuse Paternal Uncle    Alcohol abuse Paternal Grandfather    Alcohol abuse Cousin     Social History: I have reviewed social history from progress note on 03/20/2023. Social History   Socioeconomic History   Marital status:  Divorced    Spouse name: Not on file   Number of children: 0   Years of education: Not on file   Highest education level: High school graduate  Occupational History   Not on file  Tobacco Use   Smoking status: Former    Current packs/day: 0.00    Average packs/day: 1 pack/day for 38.3 years (38.3 ttl pk-yrs)    Types: Cigarettes    Start date: 50    Quit date: 05/29/2021    Years since quitting: 2.2   Smokeless tobacco: Never  Vaping Use   Vaping status: Never Used  Substance and Sexual Activity   Alcohol use: No   Drug use: Not Currently    Comment: former substance abuser - cocaine, pills, marijuana   Sexual activity: Not Currently  Other Topics Concern   Not on file  Social History Narrative   Not on file   Social Drivers of Health   Financial Resource Strain: Not on file  Food Insecurity: Not on file  Transportation Needs: Not on file  Physical Activity: Not on file  Stress: Not on file  Social Connections: Not on file    Allergies:  Allergies  Allergen Reactions   Nsaids Nausea And Vomiting and Anaphylaxis    Only in very high doses   Prednisone Anaphylaxis   Tolmetin Nausea And Vomiting    Only in very high doses    Metabolic Disorder Labs: Lab Results  Component Value Date   HGBA1C 5.6 06/21/2023   No results found for: PROLACTIN Lab Results  Component Value Date   CHOL 145 06/21/2023   TRIG 162 (H) 06/21/2023   HDL 33 (L) 06/21/2023   CHOLHDL 4.4 06/21/2023   LDLCALC 84 06/21/2023   LDLCALC 75 12/15/2022   Lab Results  Component Value Date   TSH 1.860 05/17/2021    Therapeutic Level Labs: No results found for: LITHIUM No results found for: VALPROATE No results found for: CBMZ  Current Medications: Current Outpatient Medications  Medication Sig Dispense Refill   atorvastatin  (LIPITOR) 20 MG tablet Take 1 tablet (20 mg total) by mouth at bedtime. 90 tablet 3   cetirizine (ZYRTEC) 10 MG chewable tablet Chew 10 mg by mouth.      Cholecalciferol (VITAMIN D3 PO) Take by mouth.     clonazePAM  (KLONOPIN ) 0.5 MG tablet Take 0.5-1 tablets (0.25-0.5 mg total) by mouth as directed. Take half tablet daily morning and half tablet daily as needed for severe anxiety .please limit use 30 tablet 0   Cyanocobalamin  (B-12 PO) Take by mouth.     hydrochlorothiazide  (MICROZIDE ) 12.5 MG capsule Take 1 capsule (12.5 mg total) by mouth every morning. 90 capsule 3   irbesartan  (AVAPRO ) 75 MG tablet Take 1 tablet (75 mg total) by mouth daily. 90 tablet 3   ketoconazole  (NIZORAL ) 2 % shampoo apply three times per week, massage into scalp and leave in for at least 5 minutes before rinsing out 120 mL 11   lansoprazole  (PREVACID ) 15 MG capsule TAKE 1  CAPSULE BY MOUTH ONCE DAILY 90 capsule 3   Magnesium Gluconate 550 MG TABS Take by mouth.     metroNIDAZOLE  (METROCREAM ) 0.75 % cream Apply twice daily to affected areas of face 45 g 11   Multiple Vitamin (MULTIVITAMIN ADULT PO) Take by mouth.     oxyCODONE -acetaminophen  (PERCOCET) 7.5-325 MG tablet Take 1 tablet by mouth every 8 (eight) hours as needed for severe pain (pain score 7-10). 90 tablet 0   PARoxetine  (PAXIL ) 20 MG tablet Take 1 tablet (20 mg total) by mouth daily. 30 tablet 1   SYRINGE-NEEDLE, DISP, 3 ML (B-D SYRINGE/NEEDLE 3CC/23GX1) 23G X 1 3 ML MISC Use 1 syringe with needle every 21 days; To be used for IM testosterone  injection. 50 each 0   testosterone  cypionate (DEPOTESTOSTERONE CYPIONATE) 200 MG/ML injection Inject 0.5 mLs (100 mg total) into the muscle every 21 days. 10 mL 0   tiZANidine  (ZANAFLEX ) 2 MG tablet TAKE 1 TABLET BY MOUTH EVERY 6 HOURS AS NEEDED FOR MUSCLE SPASM **NO MORE THAN 4 TABS IN 24 HOURS** 120 tablet 1   traZODone  (DESYREL ) 100 MG tablet Take 1-2 tablets (100-200 mg total) by mouth at bedtime. Takes (2) 100mg  tabs every night 180 tablet 3   valsartan (DIOVAN) 80 MG tablet Take 80 mg by mouth.     No current facility-administered medications for this visit.      Musculoskeletal: Strength & Muscle Tone: within normal limits Gait & Station: Slowed due to pain Patient leans: Front  Psychiatric Specialty Exam: Review of Systems  Psychiatric/Behavioral:  Positive for decreased concentration, dysphoric mood and sleep disturbance. The patient is nervous/anxious.     Blood pressure 107/76, pulse (!) 114, temperature 97.8 F (36.6 C), temperature source Temporal, height 6' (1.829 m), weight 206 lb (93.4 kg).Body mass index is 27.94 kg/m.  General Appearance: Casual  Eye Contact:  Fair  Speech:  Clear and Coherent  Volume:  Normal  Mood:  Anxious and Irritable  Affect:  Congruent  Thought Process:  Goal Directed and Descriptions of Associations: Intact  Orientation:  Full (Time, Place, and Person)  Thought Content: Logical   Suicidal Thoughts:  No  Homicidal Thoughts:  No  Memory:  Immediate;   Fair Recent;   Fair Remote;   Fair  Judgement:  Fair  Insight:  Fair  Psychomotor Activity:  Normal  Concentration:  Concentration: Fair and Attention Span: Fair  Recall:  [employer] of Knowledge: Fair  Language: Fair  Akathisia:  No  Handed:  Right  AIMS (if indicated): not done  Assets:  Communication Skills Desire for Improvement Housing Transportation  ADL's:  Intact  Cognition: WNL  Sleep:  Poor   Screenings: GAD-7    Loss adjuster, chartered Office Visit from 07/12/2023 in [facility] Office Visit from 04/27/2023 in [facility] Office Visit from 03/20/2023 in [facility]  Total GAD-7 Score 16 13 13    Mini-Mental    Flowsheet Row Office Visit from 06/15/2023 in [facility] Office Visit from 06/14/2022 in [facility] Office Visit from 06/09/2021 in [facility]  Total Score (max 30 points ) 30 30 30    PHQ2-9    Flowsheet Row Office Visit from 08/30/2023 in [facility] Office Visit from 07/12/2023 in [facility] Office Visit from 04/27/2023 in [facility] Office Visit from 03/20/2023 in [facility] Office Visit from 03/07/2022 in [facility]  PHQ-2 Total Score 5 4 4  0 6  PHQ-9 Total Score 16 18 17 14 18    Flowsheet Row Office Visit from 08/30/2023 in [facility] Office Visit from 07/12/2023 in [facility] Office Visit from 06/28/2023 in [facility]  C-SSRS RISK CATEGORY No Risk No Risk No Risk     Assessment and Plan: SIVAN QUAST is a 63 year old Caucasian male with history of depression, anxiety, chronic pain was evaluated in the office today for a follow-up appointment.  Discussed assessment and plan as noted below.  Generalized anxiety disorder-unstable MDD-unstable Insomnia-unstable Patient continues to struggle with significant anxiety mostly related to pain.  Currently on a low dosage of clonazepam  which he has been taking as needed for the past couple of weeks.  His pain is affecting his mood, causing irritability, physical limitations.  He does struggle with sleep.  Although previously trazodone  helped currently since this pain is exacerbated his sleep is affected.  He is not interested in dosage increase of Paxil  today and declined psychotherapy referral.  He would like his clonazepam  to be continued at this time, aware of long-term risk of being on benzodiazepine therapy as well as interaction with opioids. Continue Paxil  20 mg daily Continue Clonazepam  0.25 mg twice a day as needed.  Patient to limit use.  Long-term plan to taper it off. Discussed the referral for psychotherapy sessions again, patient declines. Reviewed [place] PMP AWARxE Continue Trazodone   100-200 mg at bedtime as needed Discussed adding Melatonin combination medications like melatonin with L theanine, Ashwaganda, Mg. Discussed sufficient pain management. I reached out to Dr.Bilal Lateef, pain management provider, currently accepting new patients.  I discussed with the patient regarding a referral however patient declines at this time.  Follow-up Follow-up in clinic in 6 to 8 weeks or sooner if needed.     Collaboration of Care: Collaboration of Care: Patient refused AEB declined referral for psychotherapy/pain management.  Patient/Guardian was advised Release of Information must be obtained prior to any record release in order to collaborate their care with an outside provider. Patient/Guardian was advised if they have not already done so to contact the registration department to sign all necessary forms in order for us  to release information regarding their care.   Consent: Patient/Guardian gives verbal consent for treatment and assignment of benefits for services provided during this visit. Patient/Guardian expressed understanding and agreed to proceed.  This note was generated in part or whole with voice recognition software. Voice recognition is usually quite accurate but there are transcription errors that can and very often do occur. I apologize for any typographical errors that were not detected and corrected.     Ernesto Zukowski, MD 08/30/2023, 12:57 PM

## 2023-09-04 ENCOUNTER — Ambulatory Visit: Payer: Self-pay | Admitting: Dermatology

## 2023-09-04 ENCOUNTER — Ambulatory Visit: Admitting: Dermatology

## 2023-09-04 DIAGNOSIS — L720 Epidermal cyst: Secondary | ICD-10-CM

## 2023-09-04 NOTE — Progress Notes (Signed)
   Follow-Up Visit   Subjective  Nathan Meyer is a 63 y.o. male who presents for the following: Suture removal Patient reports some tender areas and noticed some bumps just below the incision site    Pathology showed epidermoid cyst at right lower posterior neck  The following portions of the chart were reviewed this encounter and updated as appropriate: medications, allergies, medical history  Review of Systems:  No other skin or systemic complaints except as noted in HPI or Assessment and Plan.  Objective  Well appearing patient in no apparent distress; mood and affect are within normal limits.  Areas Examined: Right lower post neck Relevant physical exam findings are noted in the Assessment and Plan.    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 Call for changes. Discussed cosmetic procedure electrodesiccation vs shv removal  , noncovered.  $60 for 1st lesion and $15 for each additional lesion if done on the same day.  Maximum charge $350.  One touch-up treatment included no charge. Discussed risks of treatment including dyspigmentation, small scar, and/or recurrence. Recommend daily broad spectrum sunscreen SPF 30+/photoprotection to treated areas once healed.  Probable irritation from bandage  Exam: excision with few surrounding pink papules  Treatment Plan: Reaction from bandage, will improve now that bandage no longer needed Recommend otc hydrocortisone 1 % cream to use at affected areas bid until clear    Encounter for Removal of Sutures - Incision site is clean, dry and intact. - Wound cleansed, sutures removed, wound cleansed and steri strips applied.  - Discussed pathology results showing epidermoid cyst at right lower post neck - Patient advised to keep steri-strips dry until they fall off. - Scars remodel for a full year. - Once steri-strips fall off, patient can  apply over-the-counter silicone scar cream once to twice a day to help with scar remodeling if desired. - Patient advised to call with any concerns or if they notice any new or changing lesions.  Return for keep followup as scheduled .  I, Eleanor Blush, CMA, am acting as scribe for Rexene Rattler, MD.   Documentation: I have reviewed the above documentation for accuracy and completeness, and I agree with the above.  Rexene Rattler, MD

## 2023-09-04 NOTE — Patient Instructions (Signed)

## 2023-09-14 ENCOUNTER — Encounter: Payer: Self-pay | Admitting: Nurse Practitioner

## 2023-09-14 ENCOUNTER — Ambulatory Visit: Admitting: Nurse Practitioner

## 2023-09-14 VITALS — BP 110/88 | HR 106 | Temp 98.2°F | Resp 16 | Ht 72.0 in | Wt 207.4 lb

## 2023-09-14 DIAGNOSIS — F1120 Opioid dependence, uncomplicated: Secondary | ICD-10-CM | POA: Diagnosis not present

## 2023-09-14 DIAGNOSIS — E291 Testicular hypofunction: Secondary | ICD-10-CM | POA: Diagnosis not present

## 2023-09-14 DIAGNOSIS — Z79899 Other long term (current) drug therapy: Secondary | ICD-10-CM | POA: Diagnosis not present

## 2023-09-14 DIAGNOSIS — M47816 Spondylosis without myelopathy or radiculopathy, lumbar region: Secondary | ICD-10-CM | POA: Diagnosis not present

## 2023-09-14 DIAGNOSIS — G8929 Other chronic pain: Secondary | ICD-10-CM

## 2023-09-14 DIAGNOSIS — Z87891 Personal history of nicotine dependence: Secondary | ICD-10-CM | POA: Diagnosis not present

## 2023-09-14 DIAGNOSIS — M5416 Radiculopathy, lumbar region: Secondary | ICD-10-CM | POA: Diagnosis not present

## 2023-09-14 DIAGNOSIS — M5442 Lumbago with sciatica, left side: Secondary | ICD-10-CM

## 2023-09-14 DIAGNOSIS — M5441 Lumbago with sciatica, right side: Secondary | ICD-10-CM

## 2023-09-14 LAB — POCT URINE DRUG SCREEN
Methylenedioxyamphetamine: NOT DETECTED
POC Amphetamine UR: NOT DETECTED
POC BENZODIAZEPINES UR: NOT DETECTED
POC Barbiturate UR: NOT DETECTED
POC Cocaine UR: NOT DETECTED
POC Ecstasy UR: NOT DETECTED
POC Marijuana UR: NOT DETECTED
POC Methadone UR: NOT DETECTED
POC Methamphetamine UR: NOT DETECTED
POC Opiate Ur: NOT DETECTED
POC Oxycodone UR: POSITIVE — AB
POC PHENCYCLIDINE UR: NOT DETECTED
POC TRICYCLICS UR: NOT DETECTED

## 2023-09-14 MED ORDER — OXYCODONE-ACETAMINOPHEN 7.5-325 MG PO TABS: 1.0000 | ORAL_TABLET | Freq: Three times a day (TID) | ORAL | 0 refills | Status: DC | PRN

## 2023-09-14 NOTE — Progress Notes (Signed)
 [facility] [address] [place], [place] [address]  Internal MEDICINE  Office Visit Note  Patient Name: Nathan Meyer  [id number]  [id number]  Date of Service: 09/14/2023  Chief Complaint  Patient presents with   Depression   Gastroesophageal Reflux   Hyperlipidemia   Follow-up    HPI Tymeir presents for a follow-up visit for chronic back pain, GAD, pulmonary nodules and medication refills GAD -- seeing psychiatry, was weaned off of alprazolam  and then was started on clonazepam  by psych. Takes 1 to 2 tablets daily as needed  Chronic back pain -- has appt in September with spine surgery, he is considering back surgery if it is recommended by the spinal surgeon because the pain medication is not as effective and he would like to improve his quality of life.  Upcoming repeat CT chest in September. Quit smoking a couple of years ago. Had 2 pulmonary nodules on his chest CT that were at least 8 mm in diameter. He is scheduled for this repeat CT on 10/30/23.  Continues to do TRT. Labs have been stable.     Current Medication: Outpatient Encounter Medications as of 09/14/2023  Medication Sig   atorvastatin  (LIPITOR) 20 MG tablet Take 1 tablet (20 mg total) by mouth at bedtime.   cetirizine (ZYRTEC) 10 MG chewable tablet Chew 10 mg by mouth.   Cholecalciferol (VITAMIN D3 PO) Take by mouth.   clonazePAM  (KLONOPIN ) 0.5 MG tablet Take 0.5-1 tablets (0.25-0.5 mg total) by mouth as directed. Take half tablet daily morning and half tablet daily as needed for severe anxiety .please limit use   Cyanocobalamin  (B-12 PO) Take by mouth.   hydrochlorothiazide  (MICROZIDE ) 12.5 MG capsule Take 1 capsule (12.5 mg total) by mouth every morning.   irbesartan  (AVAPRO ) 75 MG tablet Take 1 tablet (75 mg total) by mouth daily.   ketoconazole  (NIZORAL ) 2 % shampoo apply three times per week, massage into scalp and leave in for at least 5 minutes before rinsing out   lansoprazole  (PREVACID ) 15  MG capsule TAKE 1 CAPSULE BY MOUTH ONCE DAILY   Magnesium Gluconate 550 MG TABS Take by mouth.   metroNIDAZOLE  (METROCREAM ) 0.75 % cream Apply twice daily to affected areas of face   Multiple Vitamin (MULTIVITAMIN ADULT PO) Take by mouth.   [START ON 09/15/2023] oxyCODONE -acetaminophen  (PERCOCET) 7.5-325 MG tablet Take 1 tablet by mouth every 8 (eight) hours as needed for severe pain (pain score 7-10). May take 1 additional tablet once daily for breakthrough pain   [START ON 10/16/2023] oxyCODONE -acetaminophen  (PERCOCET) 7.5-325 MG tablet Take 1 tablet by mouth every 8 (eight) hours as needed for severe pain (pain score 7-10).   PARoxetine  (PAXIL ) 20 MG tablet Take 1 tablet (20 mg total) by mouth daily.   SYRINGE-NEEDLE, DISP, 3 ML (B-D SYRINGE/NEEDLE 3CC/23GX1) 23G X 1 3 ML MISC Use 1 syringe with needle every 21 days; To be used for IM testosterone  injection.   testosterone  cypionate (DEPOTESTOSTERONE CYPIONATE) 200 MG/ML injection Inject 0.5 mLs (100 mg total) into the muscle every 21 days.   tiZANidine  (ZANAFLEX ) 2 MG tablet TAKE 1 TABLET BY MOUTH EVERY 6 HOURS AS NEEDED FOR MUSCLE SPASM **NO MORE THAN 4 TABS IN 24 HOURS**   traZODone  (DESYREL ) 100 MG tablet Take 1-2 tablets (100-200 mg total) by mouth at bedtime. Takes (2) 100mg  tabs every night   valsartan (DIOVAN) 80 MG tablet Take 80 mg by mouth.   [DISCONTINUED] oxyCODONE -acetaminophen  (PERCOCET) 7.5-325 MG tablet Take 1 tablet by mouth  every 8 (eight) hours as needed for severe pain (pain score 7-10).   [DISCONTINUED] oxyCODONE -acetaminophen  (PERCOCET) 7.5-325 MG tablet Take 1 tablet by mouth every 8 (eight) hours as needed for severe pain (pain score 7-10).   [DISCONTINUED] oxyCODONE -acetaminophen  (PERCOCET) 7.5-325 MG tablet Take 1 tablet by mouth every 8 (eight) hours as needed for severe pain (pain score 7-10).   No facility-administered encounter medications on file as of 09/14/2023.    Surgical History: Past Surgical History:   Procedure Laterality Date   ANTERIOR FUSION CERVICAL SPINE  02/14/1993   finger debridement      Medical History: Past Medical History:  Diagnosis Date   Anxiety    Arthritis    Cyst, kidney, acquired    Depression    GERD (gastroesophageal reflux disease)    Hyperlipidemia    Neuromuscular disorder (HCC)    Spine degeneration    Substance abuse (HCC)     Family History: Family History  Problem Relation Age of Onset   Arthritis Mother    Hypertension Mother    Hyperlipidemia Mother    Anxiety disorder Mother    Depression Father    COPD Father    Alcohol abuse Maternal Uncle    Alcohol abuse Paternal Uncle    Alcohol abuse Paternal Grandfather    Alcohol abuse Cousin     Social History   Socioeconomic History   Marital status: Divorced    Spouse name: Not on file   Number of children: 0   Years of education: Not on file   Highest education level: High school graduate  Occupational History   Not on file  Tobacco Use   Smoking status: Former    Current packs/day: 0.00    Average packs/day: 1 pack/day for 38.3 years (38.3 ttl pk-yrs)    Types: Cigarettes    Start date: 29    Quit date: 05/29/2021    Years since quitting: 2.2   Smokeless tobacco: Never  Vaping Use   Vaping status: Never Used  Substance and Sexual Activity   Alcohol use: No   Drug use: Not Currently    Comment: former substance abuser - cocaine, pills, marijuana   Sexual activity: Not Currently  Other Topics Concern   Not on file  Social History Narrative   Not on file   Social Drivers of Health   Financial Resource Strain: Not on file  Food Insecurity: Not on file  Transportation Needs: Not on file  Physical Activity: Not on file  Stress: Not on file  Social Connections: Not on file  Intimate Partner Violence: Not on file      Review of Systems  Constitutional:  Positive for activity change and fatigue. Negative for chills and unexpected weight change.  HENT:  Negative  for congestion, rhinorrhea, sneezing and sore throat.   Eyes:  Negative for redness.  Respiratory:  Positive for shortness of breath (intermittent and mild). Negative for cough, chest tightness and wheezing.   Cardiovascular: Negative.  Negative for chest pain and palpitations.  Gastrointestinal:  Negative for abdominal pain, constipation, diarrhea, nausea and vomiting.  Genitourinary:  Negative for dysuria and frequency.  Musculoskeletal:  Positive for arthralgias and back pain. Negative for joint swelling and neck pain.  Neurological: Negative.  Negative for tremors and numbness.  Hematological:  Negative for adenopathy. Does not bruise/bleed easily.  Psychiatric/Behavioral:  Positive for behavioral problems (Depression) and decreased concentration. Negative for self-injury, sleep disturbance and suicidal ideas. The patient is nervous/anxious.  Vital Signs: BP 110/88   Pulse (!) 106   Temp 98.2 F (36.8 C)   Resp 16   Ht 6' (1.829 m)   Wt 207 lb 6.4 oz (94.1 kg)   SpO2 95%   BMI 28.13 kg/m    Physical Exam Vitals reviewed.  Constitutional:      General: He is not in acute distress.    Appearance: Normal appearance. He is obese. He is not ill-appearing.  HENT:     Head: Normocephalic and atraumatic.  Eyes:     Pupils: Pupils are equal, round, and reactive to light.  Cardiovascular:     Rate and Rhythm: Normal rate and regular rhythm.  Pulmonary:     Effort: Pulmonary effort is normal. No respiratory distress.  Skin:    Capillary Refill: Capillary refill takes less than 2 seconds.  Neurological:     Mental Status: He is alert and oriented to person, place, and time.  Psychiatric:        Mood and Affect: Mood normal.        Behavior: Behavior normal.        Assessment/Plan: 1. Chronic bilateral low back pain with bilateral sciatica (Primary) Continue oxycodone  as prescribed. Has upcoming appt with neurosurgery.  - oxyCODONE -acetaminophen  (PERCOCET) 7.5-325 MG  tablet; Take 1 tablet by mouth every 8 (eight) hours as needed for severe pain (pain score 7-10). May take 1 additional tablet once daily for breakthrough pain  Dispense: 105 tablet; Refill: 0 - oxyCODONE -acetaminophen  (PERCOCET) 7.5-325 MG tablet; Take 1 tablet by mouth every 8 (eight) hours as needed for severe pain (pain score 7-10).  Dispense: 90 tablet; Refill: 0  2. Lumbar facet arthropathy Continue oxycodone  as prescribed. Has upcoming appt with neurosurgery - oxyCODONE -acetaminophen  (PERCOCET) 7.5-325 MG tablet; Take 1 tablet by mouth every 8 (eight) hours as needed for severe pain (pain score 7-10). May take 1 additional tablet once daily for breakthrough pain  Dispense: 105 tablet; Refill: 0 - oxyCODONE -acetaminophen  (PERCOCET) 7.5-325 MG tablet; Take 1 tablet by mouth every 8 (eight) hours as needed for severe pain (pain score 7-10).  Dispense: 90 tablet; Refill: 0  3. Lumbar radiculopathy, chronic Upcoming appt with neurosurgery   4. Hypogonadism in male Continue injectable testosterone  therapy as prescribed   5. Encounter for long-term current use of high risk medication UDS positive for oxycodone  which is consistent with his current prescriptions  - POCT Urine Drug Screen  6. Stopped smoking with greater than 30 pack year history Has upcoming CT chest nodule follow up in september  7. Uncomplicated opioid dependence (HCC) Refills prescribed. Patient has upcoming appt with neurosurgery to see if surgery will be the next step to help alleviate his back pain   General Counseling: Adarrius verbalizes understanding of the findings of todays visit and agrees with plan of treatment. I have discussed any further diagnostic evaluation that may be needed or ordered today. We also reviewed his medications today. he has been encouraged to call the office with any questions or concerns that should arise related to todays visit.    Orders Placed This Encounter  Procedures   POCT Urine  Drug Screen    Meds ordered this encounter  Medications   DISCONTD: oxyCODONE -acetaminophen  (PERCOCET) 7.5-325 MG tablet    Sig: Take 1 tablet by mouth every 8 (eight) hours as needed for severe pain (pain score 7-10).    Dispense:  90 tablet    Refill:  0    For future refill for September  DISCONTD: oxyCODONE -acetaminophen  (PERCOCET) 7.5-325 MG tablet    Sig: Take 1 tablet by mouth every 8 (eight) hours as needed for severe pain (pain score 7-10).    Dispense:  90 tablet    Refill:  0    For future refill for August   oxyCODONE -acetaminophen  (PERCOCET) 7.5-325 MG tablet    Sig: Take 1 tablet by mouth every 8 (eight) hours as needed for severe pain (pain score 7-10). May take 1 additional tablet once daily for breakthrough pain    Dispense:  105 tablet    Refill:  0    Cancel previous order for percocet dated for 09/20/23. Note change in number of tab and instructions please fill on 09/15/23   oxyCODONE -acetaminophen  (PERCOCET) 7.5-325 MG tablet    Sig: Take 1 tablet by mouth every 8 (eight) hours as needed for severe pain (pain score 7-10).    Dispense:  90 tablet    Refill:  0    Cancel previous order dated 10/18/23 and keep this on file for September 1st refill    Return in about 2 months (around 11/14/2023) for F/U, Shaleigh Laubscher PCP.   Total time spent:30 Minutes Time spent includes review of chart, medications, test results, and follow up plan with the patient.   [place] Controlled Substance Database was reviewed by me.  This patient was seen by Mardy Maxin, FNP-C in collaboration with Dr. Sigrid Bathe as a part of collaborative care agreement.   Vila Dory R. Maxin, MSN, FNP-C Internal medicine

## 2023-09-16 ENCOUNTER — Encounter: Payer: Self-pay | Admitting: Nurse Practitioner

## 2023-10-10 ENCOUNTER — Other Ambulatory Visit: Payer: Self-pay | Admitting: Psychiatry

## 2023-10-10 DIAGNOSIS — F1320 Sedative, hypnotic or anxiolytic dependence, uncomplicated: Secondary | ICD-10-CM

## 2023-10-10 DIAGNOSIS — F411 Generalized anxiety disorder: Secondary | ICD-10-CM

## 2023-10-14 ENCOUNTER — Other Ambulatory Visit: Payer: Self-pay | Admitting: Psychiatry

## 2023-10-14 DIAGNOSIS — F411 Generalized anxiety disorder: Secondary | ICD-10-CM

## 2023-10-14 DIAGNOSIS — F331 Major depressive disorder, recurrent, moderate: Secondary | ICD-10-CM

## 2023-10-19 ENCOUNTER — Telehealth: Payer: Self-pay

## 2023-10-19 NOTE — Progress Notes (Signed)
   10/19/2023  Patient ID: Nathan Meyer, male   DOB: 06-21-1960, 63 y.o.   MRN: [id number]  This patient is appearing on a report for being at risk of failing the adherence measure for identified medications this calendar year.   Medication Adherence Summary (STAR/HEDIS Monitoring): Adherence Category: cholesterol (statin) and hypertension (ACEi/ARB)    Drug Name: IRBESARTAN  75 MG TABLET Last Fill or Sold Date:08/12/2023 Days' Supply: 90   Drug Name: ATORVASTATIN  20 MG TABLET Sold Date:09/05/2023 Days' Supply: 90    Notes: ? Adherence data not pulled from pharmacy claims portal Dr. Annemarie. I called [facility] for the above information.  ? Reviewed barriers to adherence: none identified. ? Plan: Follow up prior to next fill  Dorcas Solian, PharmD Clinical Pharmacist Cell: [phone]

## 2023-10-30 ENCOUNTER — Ambulatory Visit
Admission: RE | Admit: 2023-10-30 | Discharge: 2023-10-30 | Disposition: A | Discharge status: Home / Self Care | Source: Ambulatory Visit | Attending: Nurse Practitioner

## 2023-10-30 ENCOUNTER — Other Ambulatory Visit

## 2023-10-30 DIAGNOSIS — R911 Solitary pulmonary nodule: Secondary | ICD-10-CM | POA: Diagnosis not present

## 2023-10-30 DIAGNOSIS — J432 Centrilobular emphysema: Secondary | ICD-10-CM | POA: Diagnosis not present

## 2023-10-30 DIAGNOSIS — R918 Other nonspecific abnormal finding of lung field: Secondary | ICD-10-CM

## 2023-11-01 DIAGNOSIS — Z6827 Body mass index (BMI) 27.0-27.9, adult: Secondary | ICD-10-CM | POA: Diagnosis not present

## 2023-11-01 DIAGNOSIS — M5416 Radiculopathy, lumbar region: Secondary | ICD-10-CM | POA: Diagnosis not present

## 2023-11-08 NOTE — Telephone Encounter (Signed)
 Inri called in wanting to schedule surgery, per Ginnie. Reviewed with Dr. Ethyl, he does need to be seen in office first. I did call pt and left a message.

## 2023-11-13 ENCOUNTER — Ambulatory Visit (INDEPENDENT_AMBULATORY_CARE_PROVIDER_SITE_OTHER): Admitting: Psychiatry

## 2023-11-13 ENCOUNTER — Encounter: Payer: Self-pay | Admitting: Psychiatry

## 2023-11-13 VITALS — BP 104/64 | HR 98 | Temp 98.0°F | Ht 70.0 in | Wt 207.0 lb

## 2023-11-13 DIAGNOSIS — F411 Generalized anxiety disorder: Secondary | ICD-10-CM | POA: Diagnosis not present

## 2023-11-13 DIAGNOSIS — F1320 Sedative, hypnotic or anxiolytic dependence, uncomplicated: Secondary | ICD-10-CM | POA: Diagnosis not present

## 2023-11-13 DIAGNOSIS — F331 Major depressive disorder, recurrent, moderate: Secondary | ICD-10-CM | POA: Diagnosis not present

## 2023-11-13 DIAGNOSIS — G4701 Insomnia due to medical condition: Secondary | ICD-10-CM

## 2023-11-13 MED ORDER — CLONAZEPAM 0.5 MG PO TABS: 0.2500 mg | ORAL_TABLET | ORAL | 0 refills | Status: DC

## 2023-11-13 MED ORDER — PAROXETINE HCL 40 MG PO TABS: 40.0000 mg | ORAL_TABLET | Freq: Every day | ORAL | 1 refills | Status: DC

## 2023-11-13 NOTE — Progress Notes (Signed)
 [facility] MD OP Progress Note  11/13/2023 2:49 PM [name]  MRN:  [id number]  Chief Complaint:  Chief Complaint  Patient presents with   Follow-up   Anxiety   Depression   Medication Refill   Discussed the use of AI scribe software for clinical note transcription with the patient, who gave verbal consent to proceed.  History of Present Illness Nathan Meyer is a 63 year old Caucasian male, lives with girlfriend in [place], has a history of MDD, GAD, insomnia, chronic pain was evaluated in office today for a follow-up appointment.  Ongoing anxiety affects him, and he frequently worries and has difficulty managing stress, particularly in relation to interpersonal issues with his girlfriend . He reports that anxiety continues, even as pain symptoms have improved, and he identifies ongoing relationship conflicts and communication difficulties at home as contributing factors. He feels irritated when he perceives dishonesty or notices unresolved household tasks, which increases his distress. As coping strategies, he uses reading self-help materials and prayer.  Sleep disturbance persists, and he states he is not sleeping like he used to. He currently takes trazodone  200 mg at night for sleep, and he previously used 100 mg most of the time. He remains uncertain about whether pain or medication effects contribute to his sleep issues. He prefers not to change his sleep medication at this time due to difficulty tolerating new medications.  He takes Paxil  20 mg daily in the morning with food or a breakfast drink. He acknowledges that he has not increased the dose as previously discussed and remains on the lower dose. He typically uses a whole tablet of clonazepam  three times a week, though he sometimes takes a half tablet and occasionally skips days. He notes that clonazepam  helps with his anxiety, while recognizing it is not a long-term solution.   He denies any suicidality, homicidality or  perceptual disturbances.  Visit Diagnosis:    ICD-10-CM   1. MDD (major depressive disorder), recurrent episode, moderate (HCC)  F33.1     2. GAD (generalized anxiety disorder)  F41.1 PARoxetine  (PAXIL ) 40 MG tablet    clonazePAM  (KLONOPIN ) 0.5 MG tablet    3. Insomnia due to medical condition  G47.01 PARoxetine  (PAXIL ) 40 MG tablet   anxiety, pain    4. Benzodiazepine dependence (HCC)  F13.20 clonazePAM  (KLONOPIN ) 0.5 MG tablet      Past Psychiatric History: I have reviewed past psychiatric history from progress note on 03/20/2023.  Past trials of medications like Zoloft, fluoxetine , Cymbalta .  Past Medical History:  Past Medical History:  Diagnosis Date   Anxiety    Arthritis    Cyst, kidney, acquired    Depression    GERD (gastroesophageal reflux disease)    Hyperlipidemia    Neuromuscular disorder (HCC)    Spine degeneration    Substance abuse [facility])     Past Surgical History:  Procedure Laterality Date   ANTERIOR FUSION CERVICAL SPINE  02/14/1993   finger debridement      Family Psychiatric History: I have reviewed family psychiatric history from progress note on 03/20/2023.  Family History:  Family History  Problem Relation Age of Onset   Arthritis Mother    Hypertension Mother    Hyperlipidemia Mother    Anxiety disorder Mother    Depression Father    COPD Father    Alcohol abuse Maternal Uncle    Alcohol abuse Paternal Uncle    Alcohol abuse Paternal Grandfather    Alcohol abuse Cousin     Social  History: I have reviewed social history from progress note on 03/20/2023. Social History   Socioeconomic History   Marital status: Divorced    Spouse name: Not on file   Number of children: 0   Years of education: Not on file   Highest education level: High school graduate  Occupational History   Not on file  Tobacco Use   Smoking status: Former    Current packs/day: 0.00    Average packs/day: 1 pack/day for 38.3 years (38.3 ttl pk-yrs)    Types:  Cigarettes    Start date: 76    Quit date: 05/29/2021    Years since quitting: 2.4   Smokeless tobacco: Never  Vaping Use   Vaping status: Never Used  Substance and Sexual Activity   Alcohol use: No   Drug use: Not Currently    Comment: former substance abuser - cocaine, pills, marijuana   Sexual activity: Not Currently  Other Topics Concern   Not on file  Social History Narrative   Not on file   Social Drivers of Health   Financial Resource Strain: Not on file  Food Insecurity: Not on file  Transportation Needs: Not on file  Physical Activity: Not on file  Stress: Not on file  Social Connections: Not on file    Allergies:  Allergies  Allergen Reactions   Nsaids Nausea And Vomiting and Anaphylaxis    Only in very high doses   Prednisone Anaphylaxis   Tolmetin Nausea And Vomiting    Only in very high doses    Metabolic Disorder Labs: Lab Results  Component Value Date   HGBA1C 5.6 06/21/2023   No results found for: PROLACTIN Lab Results  Component Value Date   CHOL 145 06/21/2023   TRIG 162 (H) 06/21/2023   HDL 33 (L) 06/21/2023   CHOLHDL 4.4 06/21/2023   LDLCALC 84 06/21/2023   LDLCALC 75 12/15/2022   Lab Results  Component Value Date   TSH 1.860 05/17/2021    Therapeutic Level Labs: No results found for: LITHIUM No results found for: VALPROATE No results found for: CBMZ  Current Medications: Current Outpatient Medications  Medication Sig Dispense Refill   atorvastatin  (LIPITOR) 20 MG tablet Take 1 tablet (20 mg total) by mouth at bedtime. 90 tablet 3   Cholecalciferol (VITAMIN D3 PO) Take by mouth.     Cyanocobalamin  (B-12 PO) Take by mouth.     hydrochlorothiazide  (MICROZIDE ) 12.5 MG capsule Take 1 capsule (12.5 mg total) by mouth every morning. 90 capsule 3   irbesartan  (AVAPRO ) 75 MG tablet Take 1 tablet (75 mg total) by mouth daily. 90 tablet 3   ketoconazole  (NIZORAL ) 2 % shampoo apply three times per week, massage into scalp and  leave in for at least 5 minutes before rinsing out 120 mL 11   lansoprazole  (PREVACID ) 15 MG capsule TAKE 1 CAPSULE BY MOUTH ONCE DAILY 90 capsule 3   Magnesium Gluconate 550 MG TABS Take by mouth.     Multiple Vitamin (MULTIVITAMIN ADULT PO) Take by mouth.     oxyCODONE -acetaminophen  (PERCOCET) 7.5-325 MG tablet Take 1 tablet by mouth every 8 (eight) hours as needed for severe pain (pain score 7-10). May take 1 additional tablet once daily for breakthrough pain 105 tablet 0   oxyCODONE -acetaminophen  (PERCOCET) 7.5-325 MG tablet Take 1 tablet by mouth every 8 (eight) hours as needed for severe pain (pain score 7-10). 90 tablet 0   PARoxetine  (PAXIL ) 40 MG tablet Take 1 tablet (40 mg total) by mouth  daily with breakfast. Stop the 20 mg daily 30 tablet 1   SYRINGE-NEEDLE, DISP, 3 ML (B-D SYRINGE/NEEDLE 3CC/23GX1) 23G X 1 3 ML MISC Use 1 syringe with needle every 21 days; To be used for IM testosterone  injection. 50 each 0   testosterone  cypionate (DEPOTESTOSTERONE CYPIONATE) 200 MG/ML injection Inject 0.5 mLs (100 mg total) into the muscle every 21 days. 10 mL 0   tiZANidine  (ZANAFLEX ) 2 MG tablet TAKE 1 TABLET BY MOUTH EVERY 6 HOURS AS NEEDED FOR MUSCLE SPASM **NO MORE THAN 4 TABS IN 24 HOURS** 120 tablet 1   traZODone  (DESYREL ) 100 MG tablet Take 1-2 tablets (100-200 mg total) by mouth at bedtime. Takes (2) 100mg  tabs every night 180 tablet 3   valsartan (DIOVAN) 80 MG tablet Take 80 mg by mouth.     clonazePAM  (KLONOPIN ) 0.5 MG tablet Take 0.5-1 tablets (0.25-0.5 mg total) by mouth as directed. Take 0.25 mg daily morning and 0.25 mg daily as needed for severe anxiety 30 tablet 0   metroNIDAZOLE  (METROCREAM ) 0.75 % cream Apply twice daily to affected areas of face (Patient not taking: Reported on 11/13/2023) 45 g 11   No current facility-administered medications for this visit.     Musculoskeletal: Strength & Muscle Tone: within normal limits Gait & Station: normal Patient leans:  N/A  Psychiatric Specialty Exam: Review of Systems  Psychiatric/Behavioral:  Positive for sleep disturbance. The patient is nervous/anxious.     Blood pressure 104/64, pulse 98, temperature 98 F (36.7 C), temperature source Temporal, height 5' 10 (1.778 m), weight 207 lb (93.9 kg), SpO2 98%.Body mass index is 29.7 kg/m.  General Appearance: Casual  Eye Contact:  Fair  Speech:  Clear and Coherent  Volume:  Normal  Mood:  Anxious  Affect:  Appropriate  Thought Process:  Goal Directed and Descriptions of Associations: Intact  Orientation:  Full (Time, Place, and Person)  Thought Content: Logical   Suicidal Thoughts:  No  Homicidal Thoughts:  No  Memory:  Immediate;   Fair Recent;   Fair Remote;   Fair  Judgement:  Fair  Insight:  Fair  Psychomotor Activity:  Normal  Concentration:  Concentration: Fair and Attention Span: Fair  Recall:  [employer] of Knowledge: Fair  Language: Fair  Akathisia:  No  Handed:  Right  AIMS (if indicated): not done  Assets:  Manufacturing systems engineer Desire for Improvement Housing Social Support Transportation  ADL's:  Intact  Cognition: WNL  Sleep:  varies   Screenings: GAD-7    Loss adjuster, chartered Office Visit from 11/13/2023 in [facility] Office Visit from 07/12/2023 in [facility] Office Visit from 04/27/2023 in [facility] Office Visit from 03/20/2023 in [facility]  Total GAD-7 Score 11 16 13 13    Mini-Mental    Flowsheet Row Office Visit from 06/15/2023 in [facility] Office Visit from 06/14/2022 in [facility], [facility] Office Visit from 06/09/2021 in [facility], [facility]  Total Score (max 30 points ) 30 30 30    PHQ2-9    Flowsheet Row Office Visit from 11/13/2023 in [facility] Office Visit from 08/30/2023 in  [facility] Office Visit from 07/12/2023 in [facility] Office Visit from 04/27/2023 in [facility] Office Visit from 03/20/2023 in [facility]  PHQ-2 Total Score 4  5 4 4  0  PHQ-9 Total Score 16 16 18 17 14    Flowsheet Row Office Visit from 11/13/2023 in [facility] Office Visit from 08/30/2023 in [facility] Office Visit from 07/12/2023 in [facility]  C-SSRS RISK CATEGORY No Risk No Risk No Risk     Assessment and Plan: TIN ENGRAM is a 63 year old Caucasian male with history of depression, anxiety, chronic pain was evaluated in office today.  Discussed assessment and plan as noted below.  1. MDD (major depressive disorder), recurrent episode, moderate (HCC)-some improvement Continues to have depression and anxiety symptoms although with improvement.  Agreeable to dosage increase of Paxil .  Declines referral for CBT. Increase Paxil  to 40 mg daily.  2. GAD (generalized anxiety disorder)-unstable Ongoing anxiety symptoms mostly related to situational stressors relationship struggles, pain although with some improvement.  Currently trying to limit the use of Klonopin .  Agreeable to dosage increase of Paxil . Increase Paxil  to 40 mg daily Continue Klonopin  0.25 mg daily morning and 0.25 mg as needed for severe anxiety symptoms. Reviewed [place] PMP AWARxE  3. Insomnia due to medical condition-improving Sleep has improved although pain does limit sleep.  Not interested in changing trazodone  to another medication today. Continue Trazodone  100-200 mg at bedtime as needed Continue sufficient pain management.  4. Benzodiazepine dependence (HCC)-chronic Currently on Clonazepam  although trying to limit use. Provided  education. Continue Clonazepam  at current dosage.  Follow-up Follow-up in clinic in 8 weeks or sooner if needed.    Collaboration of Care: Collaboration of Care: Patient refused AEB declines referral for psychotherapy sessions.  Patient/Guardian was advised Release of Information must be obtained prior to any record release in order to collaborate their care with an outside provider. Patient/Guardian was advised if they have not already done so to contact the registration department to sign all necessary forms in order for us  to release information regarding their care.   Consent: Patient/Guardian gives verbal consent for treatment and assignment of benefits for services provided during this visit. Patient/Guardian expressed understanding and agreed to proceed.   This note was generated in part or whole with voice recognition software. Voice recognition is usually quite accurate but there are transcription errors that can and very often do occur. I apologize for any typographical errors that were not detected and corrected.    Kairon Shock, MD 11/13/2023, 2:49 PM

## 2023-11-16 ENCOUNTER — Encounter: Payer: Self-pay | Admitting: Nurse Practitioner

## 2023-11-16 ENCOUNTER — Ambulatory Visit (INDEPENDENT_AMBULATORY_CARE_PROVIDER_SITE_OTHER): Admitting: Nurse Practitioner

## 2023-11-16 VITALS — BP 120/76 | HR 107 | Temp 98.3°F | Resp 16 | Ht 72.0 in | Wt 206.4 lb

## 2023-11-16 DIAGNOSIS — E782 Mixed hyperlipidemia: Secondary | ICD-10-CM | POA: Diagnosis not present

## 2023-11-16 DIAGNOSIS — M5442 Lumbago with sciatica, left side: Secondary | ICD-10-CM | POA: Diagnosis not present

## 2023-11-16 DIAGNOSIS — Z23 Encounter for immunization: Secondary | ICD-10-CM | POA: Diagnosis not present

## 2023-11-16 DIAGNOSIS — J432 Centrilobular emphysema: Secondary | ICD-10-CM | POA: Diagnosis not present

## 2023-11-16 DIAGNOSIS — I2584 Coronary atherosclerosis due to calcified coronary lesion: Secondary | ICD-10-CM

## 2023-11-16 DIAGNOSIS — F1190 Opioid use, unspecified, uncomplicated: Secondary | ICD-10-CM | POA: Diagnosis not present

## 2023-11-16 DIAGNOSIS — R918 Other nonspecific abnormal finding of lung field: Secondary | ICD-10-CM

## 2023-11-16 DIAGNOSIS — M47816 Spondylosis without myelopathy or radiculopathy, lumbar region: Secondary | ICD-10-CM

## 2023-11-16 DIAGNOSIS — I517 Cardiomegaly: Secondary | ICD-10-CM

## 2023-11-16 DIAGNOSIS — I2510 Atherosclerotic heart disease of native coronary artery without angina pectoris: Secondary | ICD-10-CM

## 2023-11-16 DIAGNOSIS — Z87891 Personal history of nicotine dependence: Secondary | ICD-10-CM

## 2023-11-16 DIAGNOSIS — Z9189 Other specified personal risk factors, not elsewhere classified: Secondary | ICD-10-CM

## 2023-11-16 DIAGNOSIS — M5441 Lumbago with sciatica, right side: Secondary | ICD-10-CM

## 2023-11-16 DIAGNOSIS — G8929 Other chronic pain: Secondary | ICD-10-CM | POA: Diagnosis not present

## 2023-11-16 DIAGNOSIS — I77810 Thoracic aortic ectasia: Secondary | ICD-10-CM

## 2023-11-16 MED ORDER — OXYCODONE-ACETAMINOPHEN 7.5-325 MG PO TABS: 1.0000 | ORAL_TABLET | Freq: Three times a day (TID) | ORAL | 0 refills | Status: DC | PRN

## 2023-11-16 MED ORDER — ATORVASTATIN CALCIUM 20 MG PO TABS: 20.0000 mg | ORAL_TABLET | Freq: Every day | ORAL | 3 refills | Status: AC

## 2023-11-16 MED ORDER — TIZANIDINE HCL 2 MG PO TABS
ORAL_TABLET | ORAL | 1 refills | Status: AC
Start: 2023-11-16 — End: ?

## 2023-11-16 MED ORDER — PNEUMOCOCCAL 20-VAL CONJ VACC 0.5 ML IM SUSY: 0.5000 mL | PREFILLED_SYRINGE | Freq: Once | INTRAMUSCULAR | 0 refills | Status: AC | PRN

## 2023-11-16 NOTE — Progress Notes (Cosign Needed)
 [facility] [address] [place], [place] [address]  Internal MEDICINE  Office Visit Note  Patient Name: Nathan Meyer  [id number]  [id number]  Date of Service: 11/16/2023  Chief Complaint  Patient presents with   Depression   Gastroesophageal Reflux   Hyperlipidemia   Follow-up    HPI Leeroy presents for a follow-up visit for CT chest results, chronic pain, refills and vaccinations.  CT chest results are stable, slight improvement in pulmonary nodule size, aortic dilation is stable at 4.1 cm. Scored as Lung-RADS 3 probably benign and recommended repeat CT chest in 6 months.  Chronic back pain -- seeing neurosurgery. But pain is still not controlled, may need to try pain management referral again.  Due for prevnar 20 Requesting flu vaccine today  High cholesterol  Ascending aortic dilatation -- seen on CT scan Emphysema seen on CT chest Coronary calcifications of the left main and LAD and mild cardiomegaly    Current Medication: Outpatient Encounter Medications as of 11/16/2023  Medication Sig   pneumococcal 20-valent conjugate vaccine (PREVNAR 20) 0.5 ML injection Inject 0.5 mLs into the muscle once as needed for up to 1 dose for immunization.   atorvastatin  (LIPITOR) 20 MG tablet Take 1 tablet (20 mg total) by mouth at bedtime.   Cholecalciferol (VITAMIN D3 PO) Take by mouth.   Cyanocobalamin  (B-12 PO) Take by mouth.   hydrochlorothiazide  (MICROZIDE ) 12.5 MG capsule Take 1 capsule (12.5 mg total) by mouth every morning.   irbesartan  (AVAPRO ) 75 MG tablet Take 1 tablet (75 mg total) by mouth daily.   ketoconazole  (NIZORAL ) 2 % shampoo apply three times per week, massage into scalp and leave in for at least 5 minutes before rinsing out   lansoprazole  (PREVACID ) 15 MG capsule TAKE 1 CAPSULE BY MOUTH ONCE DAILY   Magnesium Gluconate 550 MG TABS Take by mouth.   metroNIDAZOLE  (METROCREAM ) 0.75 % cream Apply twice daily to affected areas of face (Patient not  taking: Reported on 11/13/2023)   Multiple Vitamin (MULTIVITAMIN ADULT PO) Take by mouth.   oxyCODONE -acetaminophen  (PERCOCET) 7.5-325 MG tablet Take 1 tablet by mouth every 8 (eight) hours as needed for severe pain (pain score 7-10).   oxyCODONE -acetaminophen  (PERCOCET) 7.5-325 MG tablet Take 1 tablet by mouth every 8 (eight) hours as needed for severe pain (pain score 7-10). May take 1 additional tablet once daily for breakthrough pain   [START ON 01/11/2024] oxyCODONE -acetaminophen  (PERCOCET) 7.5-325 MG tablet Take 1 tablet by mouth every 8 (eight) hours as needed for severe pain (pain score 7-10).   PARoxetine  (PAXIL ) 40 MG tablet Take 1 tablet (40 mg total) by mouth daily with breakfast. Stop the 20 mg daily   SYRINGE-NEEDLE, DISP, 3 ML (B-D SYRINGE/NEEDLE 3CC/23GX1) 23G X 1 3 ML MISC Use 1 syringe with needle every 21 days; To be used for IM testosterone  injection.   testosterone  cypionate (DEPOTESTOSTERONE CYPIONATE) 200 MG/ML injection Inject 0.5 mLs (100 mg total) into the muscle every 21 days.   tiZANidine  (ZANAFLEX ) 2 MG tablet TAKE 1 TABLET BY MOUTH EVERY 6 HOURS AS NEEDED FOR MUSCLE SPASM **NO MORE THAN 4 TABS IN 24 HOURS**   traZODone  (DESYREL ) 100 MG tablet Take 1-2 tablets (100-200 mg total) by mouth at bedtime. Takes (2) 100mg  tabs every night   [DISCONTINUED] atorvastatin  (LIPITOR) 20 MG tablet Take 1 tablet (20 mg total) by mouth at bedtime.   [DISCONTINUED] clonazePAM  (KLONOPIN ) 0.5 MG tablet Take 0.5-1 tablets (0.25-0.5 mg total) by mouth as directed. Take 0.25  mg daily morning and 0.25 mg daily as needed for severe anxiety   [DISCONTINUED] oxyCODONE -acetaminophen  (PERCOCET) 7.5-325 MG tablet Take 1 tablet by mouth every 8 (eight) hours as needed for severe pain (pain score 7-10). May take 1 additional tablet once daily for breakthrough pain   [DISCONTINUED] oxyCODONE -acetaminophen  (PERCOCET) 7.5-325 MG tablet Take 1 tablet by mouth every 8 (eight) hours as needed for severe pain  (pain score 7-10).   [DISCONTINUED] tiZANidine  (ZANAFLEX ) 2 MG tablet TAKE 1 TABLET BY MOUTH EVERY 6 HOURS AS NEEDED FOR MUSCLE SPASM **NO MORE THAN 4 TABS IN 24 HOURS**   [DISCONTINUED] valsartan (DIOVAN) 80 MG tablet Take 80 mg by mouth.   No facility-administered encounter medications on file as of 11/16/2023.    Surgical History: Past Surgical History:  Procedure Laterality Date   ANTERIOR FUSION CERVICAL SPINE  02/14/1993   finger debridement      Medical History: Past Medical History:  Diagnosis Date   Anxiety    Arthritis    Cyst, kidney, acquired    Depression    GERD (gastroesophageal reflux disease)    Hyperlipidemia    Neuromuscular disorder (HCC)    Spine degeneration    Substance abuse (HCC)     Family History: Family History  Problem Relation Age of Onset   Arthritis Mother    Hypertension Mother    Hyperlipidemia Mother    Anxiety disorder Mother    Depression Father    COPD Father    Alcohol abuse Maternal Uncle    Alcohol abuse Paternal Uncle    Alcohol abuse Paternal Grandfather    Alcohol abuse Cousin     Social History   Socioeconomic History   Marital status: Divorced    Spouse name: Not on file   Number of children: 0   Years of education: Not on file   Highest education level: High school graduate  Occupational History   Not on file  Tobacco Use   Smoking status: Former    Current packs/day: 0.00    Average packs/day: 1 pack/day for 38.3 years (38.3 ttl pk-yrs)    Types: Cigarettes    Start date: 46    Quit date: 05/29/2021    Years since quitting: 2.5   Smokeless tobacco: Never  Vaping Use   Vaping status: Never Used  Substance and Sexual Activity   Alcohol use: No   Drug use: Not Currently    Comment: former substance abuser - cocaine, pills, marijuana   Sexual activity: Not Currently  Other Topics Concern   Not on file  Social History Narrative   Not on file   Social Drivers of Health   Financial Resource Strain:  Not on file  Food Insecurity: Not on file  Transportation Needs: Not on file  Physical Activity: Not on file  Stress: Not on file  Social Connections: Not on file  Intimate Partner Violence: Not on file      Review of Systems  Constitutional:  Positive for activity change and fatigue. Negative for chills and unexpected weight change.  HENT:  Negative for congestion, rhinorrhea, sneezing and sore throat.   Eyes:  Negative for redness.  Respiratory:  Positive for shortness of breath (intermittent and mild). Negative for cough, chest tightness and wheezing.   Cardiovascular: Negative.  Negative for chest pain and palpitations.  Gastrointestinal:  Negative for abdominal pain, constipation, diarrhea, nausea and vomiting.  Genitourinary:  Negative for dysuria and frequency.  Musculoskeletal:  Positive for arthralgias and back pain. Negative  for joint swelling and neck pain.  Neurological: Negative.  Negative for tremors and numbness.  Hematological:  Negative for adenopathy. Does not bruise/bleed easily.  Psychiatric/Behavioral:  Positive for behavioral problems (Depression) and decreased concentration. Negative for self-injury, sleep disturbance and suicidal ideas. The patient is nervous/anxious.     Vital Signs: BP 120/76   Pulse (!) 107   Temp 98.3 F (36.8 C)   Resp 16   Ht 6' (1.829 m)   Wt 206 lb 6.4 oz (93.6 kg)   SpO2 97%   BMI 27.99 kg/m    Physical Exam Vitals reviewed.  Constitutional:      General: He is not in acute distress.    Appearance: Normal appearance. He is obese. He is not ill-appearing.  HENT:     Head: Normocephalic and atraumatic.  Eyes:     Pupils: Pupils are equal, round, and reactive to light.  Cardiovascular:     Rate and Rhythm: Normal rate and regular rhythm.  Pulmonary:     Effort: Pulmonary effort is normal. No respiratory distress.  Skin:    Capillary Refill: Capillary refill takes less than 2 seconds.  Neurological:     Mental Status:  He is alert and oriented to person, place, and time.  Psychiatric:        Mood and Affect: Mood normal.        Behavior: Behavior normal.        Assessment/Plan: 1. Pulmonary nodules/lesions, multiple (Primary) Repeat CT chest LCS lung nodule follow up ordered to be done in 6 months  - CT CHEST LCS NODULE F/U LOW DOSE WO CONTRAST; Future  2. Ascending aorta dilatation Repeat CT chest LCS lung nodule follow up ordered to be done in 6 months  - CT CHEST LCS NODULE F/U LOW DOSE WO CONTRAST; Future  3. Chronic bilateral low back pain with bilateral sciatica Continue percocet as prescribed for now. Follow up in 3 months for additional refills. Working with neurosurgery to determine a solution or possible alternatives to relieve pain.  - oxyCODONE -acetaminophen  (PERCOCET) 7.5-325 MG tablet; Take 1 tablet by mouth every 8 (eight) hours as needed for severe pain (pain score 7-10).  Dispense: 90 tablet; Refill: 0 - oxyCODONE -acetaminophen  (PERCOCET) 7.5-325 MG tablet; Take 1 tablet by mouth every 8 (eight) hours as needed for severe pain (pain score 7-10). May take 1 additional tablet once daily for breakthrough pain  Dispense: 105 tablet; Refill: 0 - tiZANidine  (ZANAFLEX ) 2 MG tablet; TAKE 1 TABLET BY MOUTH EVERY 6 HOURS AS NEEDED FOR MUSCLE SPASM **NO MORE THAN 4 TABS IN 24 HOURS**  Dispense: 120 tablet; Refill: 1 - oxyCODONE -acetaminophen  (PERCOCET) 7.5-325 MG tablet; Take 1 tablet by mouth every 8 (eight) hours as needed for severe pain (pain score 7-10).  Dispense: 90 tablet; Refill: 0  4. Lumbar facet arthropathy Continue percocet as prescribed for now. Follow up in 3 months for additional refills. Working with neurosurgery to determine a solution or possible alternatives to relieve pain.  - oxyCODONE -acetaminophen  (PERCOCET) 7.5-325 MG tablet; Take 1 tablet by mouth every 8 (eight) hours as needed for severe pain (pain score 7-10).  Dispense: 90 tablet; Refill: 0 - oxyCODONE -acetaminophen   (PERCOCET) 7.5-325 MG tablet; Take 1 tablet by mouth every 8 (eight) hours as needed for severe pain (pain score 7-10). May take 1 additional tablet once daily for breakthrough pain  Dispense: 105 tablet; Refill: 0 - tiZANidine  (ZANAFLEX ) 2 MG tablet; TAKE 1 TABLET BY MOUTH EVERY 6 HOURS AS NEEDED FOR MUSCLE  SPASM **NO MORE THAN 4 TABS IN 24 HOURS**  Dispense: 120 tablet; Refill: 1 - oxyCODONE -acetaminophen  (PERCOCET) 7.5-325 MG tablet; Take 1 tablet by mouth every 8 (eight) hours as needed for severe pain (pain score 7-10).  Dispense: 90 tablet; Refill: 0  5. Mixed hyperlipidemia Continue atorvastatin  as prescribed  - atorvastatin  (LIPITOR) 20 MG tablet; Take 1 tablet (20 mg total) by mouth at bedtime.  Dispense: 90 tablet; Refill: 3  6. Need for vaccination Flu vaccine administered in office today. Prevnar 20 prescription sent to pharmacy - Influenza, MDCK, trivalent, PF(Flucelvax egg-free) - pneumococcal 20-valent conjugate vaccine (PREVNAR 20) 0.5 ML injection; Inject 0.5 mLs into the muscle once as needed for up to 1 dose for immunization.  Dispense: 0.5 mL; Refill: 0  7. Stopped smoking with greater than 30 pack year history Repeat CT chest LCS nodule follow up due in 6 months  - CT CHEST LCS NODULE F/U LOW DOSE WO CONTRAST; Future   General Counseling: Dacari verbalizes understanding of the findings of todays visit and agrees with plan of treatment. I have discussed any further diagnostic evaluation that may be needed or ordered today. We also reviewed his medications today. he has been encouraged to call the office with any questions or concerns that should arise related to todays visit.    Orders Placed This Encounter  Procedures   CT CHEST LCS NODULE F/U LOW DOSE WO CONTRAST   Influenza, MDCK, trivalent, PF(Flucelvax egg-free)    Meds ordered this encounter  Medications   oxyCODONE -acetaminophen  (PERCOCET) 7.5-325 MG tablet    Sig: Take 1 tablet by mouth every 8 (eight) hours  as needed for severe pain (pain score 7-10).    Dispense:  90 tablet    Refill:  0    Fill for 10/30, not a duplicate   oxyCODONE -acetaminophen  (PERCOCET) 7.5-325 MG tablet    Sig: Take 1 tablet by mouth every 8 (eight) hours as needed for severe pain (pain score 7-10). May take 1 additional tablet once daily for breakthrough pain    Dispense:  105 tablet    Refill:  0    Fill for today asap, patient is out.   atorvastatin  (LIPITOR) 20 MG tablet    Sig: Take 1 tablet (20 mg total) by mouth at bedtime.    Dispense:  90 tablet    Refill:  3   tiZANidine  (ZANAFLEX ) 2 MG tablet    Sig: TAKE 1 TABLET BY MOUTH EVERY 6 HOURS AS NEEDED FOR MUSCLE SPASM **NO MORE THAN 4 TABS IN 24 HOURS**    Dispense:  120 tablet    Refill:  1    FOR NEXT FILL. THANK YOU.   oxyCODONE -acetaminophen  (PERCOCET) 7.5-325 MG tablet    Sig: Take 1 tablet by mouth every 8 (eight) hours as needed for severe pain (pain score 7-10).    Dispense:  90 tablet    Refill:  0    Fill for 11/27, not a duplicate order   pneumococcal 20-valent conjugate vaccine (PREVNAR 20) 0.5 ML injection    Sig: Inject 0.5 mLs into the muscle once as needed for up to 1 dose for immunization.    Dispense:  0.5 mL    Refill:  0    Due for prevnar 20    Return in about 3 months (around 02/13/2024) for F/U, pain med refill, Korver Graybeal PCP.   Total time spent:30 Minutes Time spent includes review of chart, medications, test results, and follow up plan with the patient.  [place] Controlled Substance Database was reviewed by me.  This patient was seen by Mardy Maxin, FNP-C in collaboration with Dr. Sigrid Bathe as a part of collaborative care agreement.   Nimco Bivens R. Maxin, MSN, FNP-C Internal medicine

## 2023-11-17 NOTE — Progress Notes (Signed)
   11/17/2023  Patient ID: Beryl LELON Linsey, male   DOB: Feb 17, 1960, 63 y.o.   MRN: [id number]  Pharmacy Quality Measure Review  This patient is appearing on a report for being at risk of failing the adherence measure for hypertension (ACEi/ARB) medications this calendar year.   Medication: Irbesartan  Last fill date: 11/13/23 for 90 day supply  Insurance report was not up to date. No action needed at this time.   Jon VEAR Lindau, PharmD Clinical Pharmacist [phone]

## 2023-12-06 ENCOUNTER — Ambulatory Visit: Payer: Self-pay | Admitting: Internal Medicine

## 2023-12-12 NOTE — Progress Notes (Signed)
   12/12/2023  Patient ID: Beryl LELON Linsey, male   DOB: 09/07/1960, 63 y.o.   MRN: [id number]  Pharmacy Quality Measure Review  This patient is appearing on a report for being at risk of failing the adherence measure for cholesterol (statin) medications this calendar year.   Medication: Atorvastatin   Last fill date: 11/16/23 for 90 day supply  Insurance report was not up to date. No action needed at this time.   Jon VEAR Lindau, PharmD Clinical Pharmacist [phone]

## 2023-12-18 ENCOUNTER — Other Ambulatory Visit: Payer: Self-pay | Admitting: Psychiatry

## 2023-12-18 DIAGNOSIS — F1320 Sedative, hypnotic or anxiolytic dependence, uncomplicated: Secondary | ICD-10-CM

## 2023-12-18 DIAGNOSIS — F411 Generalized anxiety disorder: Secondary | ICD-10-CM

## 2023-12-19 ENCOUNTER — Encounter: Payer: Self-pay | Admitting: Nurse Practitioner

## 2023-12-19 DIAGNOSIS — E782 Mixed hyperlipidemia: Secondary | ICD-10-CM | POA: Insufficient documentation

## 2023-12-20 ENCOUNTER — Encounter: Payer: Self-pay | Admitting: Nurse Practitioner

## 2023-12-20 DIAGNOSIS — J432 Centrilobular emphysema: Secondary | ICD-10-CM | POA: Insufficient documentation

## 2023-12-20 DIAGNOSIS — Z9189 Other specified personal risk factors, not elsewhere classified: Secondary | ICD-10-CM | POA: Insufficient documentation

## 2023-12-20 DIAGNOSIS — I517 Cardiomegaly: Secondary | ICD-10-CM | POA: Insufficient documentation

## 2023-12-20 DIAGNOSIS — I2510 Atherosclerotic heart disease of native coronary artery without angina pectoris: Secondary | ICD-10-CM | POA: Insufficient documentation

## 2023-12-20 DIAGNOSIS — F1190 Opioid use, unspecified, uncomplicated: Secondary | ICD-10-CM | POA: Insufficient documentation

## 2023-12-20 NOTE — Addendum Note (Signed)
 Addended by: Teon Hudnall on: 12/20/2023 08:45 AM   Modules accepted: Orders

## 2023-12-21 ENCOUNTER — Telehealth: Payer: Self-pay

## 2023-12-21 NOTE — Telephone Encounter (Signed)
-----   Message from Kelsey [facility] sent at 12/20/2023  2:49 PM EST ----- Please cancel last RX, call in am let him that we cancelled his rx for next month, make app at his convenience

## 2023-12-21 NOTE — Telephone Encounter (Addendum)
 Spoke with patient regarding Rx, scheduled follow-up with AA for 01/09/24.

## 2024-01-09 ENCOUNTER — Encounter: Payer: Self-pay | Admitting: Nurse Practitioner

## 2024-01-09 ENCOUNTER — Ambulatory Visit: Admitting: Nurse Practitioner

## 2024-01-09 VITALS — BP 100/80 | HR 99 | Temp 98.1°F | Resp 16 | Ht 72.0 in | Wt 208.6 lb

## 2024-01-09 DIAGNOSIS — J432 Centrilobular emphysema: Secondary | ICD-10-CM | POA: Diagnosis not present

## 2024-01-09 DIAGNOSIS — Z79899 Other long term (current) drug therapy: Secondary | ICD-10-CM

## 2024-01-09 DIAGNOSIS — M5416 Radiculopathy, lumbar region: Secondary | ICD-10-CM | POA: Diagnosis not present

## 2024-01-09 DIAGNOSIS — I517 Cardiomegaly: Secondary | ICD-10-CM

## 2024-01-09 DIAGNOSIS — M5442 Lumbago with sciatica, left side: Secondary | ICD-10-CM | POA: Diagnosis not present

## 2024-01-09 DIAGNOSIS — F1190 Opioid use, unspecified, uncomplicated: Secondary | ICD-10-CM | POA: Diagnosis not present

## 2024-01-09 DIAGNOSIS — K219 Gastro-esophageal reflux disease without esophagitis: Secondary | ICD-10-CM

## 2024-01-09 DIAGNOSIS — M47816 Spondylosis without myelopathy or radiculopathy, lumbar region: Secondary | ICD-10-CM

## 2024-01-09 DIAGNOSIS — I2510 Atherosclerotic heart disease of native coronary artery without angina pectoris: Secondary | ICD-10-CM

## 2024-01-09 DIAGNOSIS — M5441 Lumbago with sciatica, right side: Secondary | ICD-10-CM

## 2024-01-09 DIAGNOSIS — G8929 Other chronic pain: Secondary | ICD-10-CM

## 2024-01-09 DIAGNOSIS — I77810 Thoracic aortic ectasia: Secondary | ICD-10-CM

## 2024-01-09 DIAGNOSIS — I2584 Coronary atherosclerosis due to calcified coronary lesion: Secondary | ICD-10-CM

## 2024-01-09 LAB — POCT URINE DRUG SCREEN
Methylenedioxyamphetamine: NOT DETECTED
POC Amphetamine UR: NOT DETECTED
POC BENZODIAZEPINES UR: NOT DETECTED
POC Barbiturate UR: NOT DETECTED
POC Cocaine UR: NOT DETECTED
POC Ecstasy UR: NOT DETECTED
POC Marijuana UR: NOT DETECTED
POC Methadone UR: NOT DETECTED
POC Methamphetamine UR: NOT DETECTED
POC Opiate Ur: NOT DETECTED
POC Oxycodone UR: NOT DETECTED
POC PHENCYCLIDINE UR: NOT DETECTED
POC TRICYCLICS UR: NOT DETECTED

## 2024-01-09 MED ORDER — OXYCODONE-ACETAMINOPHEN 5-325 MG PO TABS: 1.0000 | ORAL_TABLET | Freq: Two times a day (BID) | ORAL | 0 refills | Status: AC | PRN

## 2024-01-09 MED ORDER — LANSOPRAZOLE 30 MG PO CPDR: 30.0000 mg | DELAYED_RELEASE_CAPSULE | Freq: Every day | ORAL | 5 refills | Status: AC

## 2024-01-09 NOTE — Progress Notes (Addendum)
 [facility] [address] [place], [place] [address]  Internal MEDICINE  Office Visit Note  Patient Name: Nathan Meyer  [id number]  [id number]  Date of Service: 01/09/2024  Chief Complaint  Patient presents with   Depression   Gastroesophageal Reflux   Hyperlipidemia   Follow-up    HPI Nathan Meyer presents for a follow-up visit for vaccination, chronic pain, refill and COPD.  Received pneumonia vaccine last month UDS due -- negative for percocet and clonazepam  . Had ran out of percocet about 4 days ago.  Chronic back pain -- Monthly visit for pain med refills, still looking for a pain management specialist who is willing to prescribed oral pain medications  COPD -- Will have PFT done tomorrow and has appt in January with Dr. Elfreda Bathe.  He has cardiomegaly on his CT chest and CAD -- needs an echocardiogram.     Current Medication: Outpatient Encounter Medications as of 01/09/2024  Medication Sig   lansoprazole  (PREVACID ) 30 MG capsule Take 1 capsule (30 mg total) by mouth daily at 12 noon.   oxyCODONE -acetaminophen  (PERCOCET/ROXICET) 5-325 MG tablet Take 1 tablet by mouth 2 (two) times daily as needed for severe pain (pain score 7-10).   atorvastatin  (LIPITOR) 20 MG tablet Take 1 tablet (20 mg total) by mouth at bedtime.   Cholecalciferol (VITAMIN D3 PO) Take by mouth.   clonazePAM  (KLONOPIN ) 0.5 MG tablet TAKE ONE-HALF (1/2) TABLET EVERY MORNINGAND TAKE ONE-HALF (1/2) TABLET ONCE DAILY AS NEEDED FOR FOR SEVERE ANXIETY   Cyanocobalamin  (B-12 PO) Take by mouth.   hydrochlorothiazide  (MICROZIDE ) 12.5 MG capsule Take 1 capsule (12.5 mg total) by mouth every morning.   irbesartan  (AVAPRO ) 75 MG tablet Take 1 tablet (75 mg total) by mouth daily.   ketoconazole  (NIZORAL ) 2 % shampoo apply three times per week, massage into scalp and leave in for at least 5 minutes before rinsing out   Magnesium Gluconate 550 MG TABS Take by mouth.   metroNIDAZOLE  (METROCREAM ) 0.75 %  cream Apply twice daily to affected areas of face (Patient not taking: Reported on 11/13/2023)   Multiple Vitamin (MULTIVITAMIN ADULT PO) Take by mouth.   PARoxetine  (PAXIL ) 40 MG tablet Take 1 tablet (40 mg total) by mouth daily with breakfast. Stop the 20 mg daily   pneumococcal 20-valent conjugate vaccine (PREVNAR 20) 0.5 ML injection Inject 0.5 mLs into the muscle once as needed for up to 1 dose for immunization.   SYRINGE-NEEDLE, DISP, 3 ML (B-D SYRINGE/NEEDLE 3CC/23GX1) 23G X 1 3 ML MISC Use 1 syringe with needle every 21 days; To be used for IM testosterone  injection.   testosterone  cypionate (DEPOTESTOSTERONE CYPIONATE) 200 MG/ML injection Inject 0.5 mLs (100 mg total) into the muscle every 21 days.   tiZANidine  (ZANAFLEX ) 2 MG tablet TAKE 1 TABLET BY MOUTH EVERY 6 HOURS AS NEEDED FOR MUSCLE SPASM **NO MORE THAN 4 TABS IN 24 HOURS**   traZODone  (DESYREL ) 100 MG tablet Take 1-2 tablets (100-200 mg total) by mouth at bedtime. Takes (2) 100mg  tabs every night   [DISCONTINUED] lansoprazole  (PREVACID ) 15 MG capsule TAKE 1 CAPSULE BY MOUTH ONCE DAILY   [DISCONTINUED] oxyCODONE -acetaminophen  (PERCOCET) 7.5-325 MG tablet Take 1 tablet by mouth every 8 (eight) hours as needed for severe pain (pain score 7-10).   [DISCONTINUED] oxyCODONE -acetaminophen  (PERCOCET) 7.5-325 MG tablet Take 1 tablet by mouth every 8 (eight) hours as needed for severe pain (pain score 7-10). May take 1 additional tablet once daily for breakthrough pain   [DISCONTINUED] oxyCODONE -acetaminophen  (PERCOCET)  7.5-325 MG tablet Take 1 tablet by mouth every 8 (eight) hours as needed for severe pain (pain score 7-10).   No facility-administered encounter medications on file as of 01/09/2024.    Surgical History: Past Surgical History:  Procedure Laterality Date   ANTERIOR FUSION CERVICAL SPINE  02/14/1993   finger debridement      Medical History: Past Medical History:  Diagnosis Date   Anxiety    Arthritis    Cyst,  kidney, acquired    Depression    GERD (gastroesophageal reflux disease)    Hyperlipidemia    Neuromuscular disorder (HCC)    Spine degeneration    Substance abuse (HCC)     Family History: Family History  Problem Relation Age of Onset   Arthritis Mother    Hypertension Mother    Hyperlipidemia Mother    Anxiety disorder Mother    Depression Father    COPD Father    Alcohol abuse Maternal Uncle    Alcohol abuse Paternal Uncle    Alcohol abuse Paternal Grandfather    Alcohol abuse Cousin     Social History   Socioeconomic History   Marital status: Divorced    Spouse name: Not on file   Number of children: 0   Years of education: Not on file   Highest education level: High school graduate  Occupational History   Not on file  Tobacco Use   Smoking status: Former    Current packs/day: 0.00    Average packs/day: 1 pack/day for 38.3 years (38.3 ttl pk-yrs)    Types: Cigarettes    Start date: 55    Quit date: 05/29/2021    Years since quitting: 2.6   Smokeless tobacco: Never  Vaping Use   Vaping status: Never Used  Substance and Sexual Activity   Alcohol use: No   Drug use: Not Currently    Comment: former substance abuser - cocaine, pills, marijuana   Sexual activity: Not Currently  Other Topics Concern   Not on file  Social History Narrative   Not on file   Social Drivers of Health   Financial Resource Strain: Not on file  Food Insecurity: Not on file  Transportation Needs: Not on file  Physical Activity: Not on file  Stress: Not on file  Social Connections: Not on file  Intimate Partner Violence: Not on file      Review of Systems  Constitutional:  Positive for activity change and fatigue. Negative for chills and unexpected weight change.  HENT:  Negative for congestion, rhinorrhea, sneezing and sore throat.   Eyes:  Negative for redness.  Respiratory:  Positive for shortness of breath (intermittent and mild). Negative for cough, chest tightness  and wheezing.   Cardiovascular: Negative.  Negative for chest pain and palpitations.  Gastrointestinal:  Negative for abdominal pain, constipation, diarrhea, nausea and vomiting.  Genitourinary:  Negative for dysuria and frequency.  Musculoskeletal:  Positive for arthralgias and back pain. Negative for joint swelling and neck pain.  Neurological: Negative.  Negative for tremors and numbness.  Hematological:  Negative for adenopathy. Does not bruise/bleed easily.  Psychiatric/Behavioral:  Positive for behavioral problems (Depression) and decreased concentration. Negative for self-injury, sleep disturbance and suicidal ideas. The patient is nervous/anxious.     Vital Signs: BP 100/80   Pulse 99   Temp 98.1 F (36.7 C)   Resp 16   Ht 6' (1.829 m)   Wt 208 lb 9.6 oz (94.6 kg)   SpO2 95%   BMI 28.29  kg/m    Physical Exam Vitals reviewed.  Constitutional:      General: He is not in acute distress.    Appearance: Normal appearance. He is obese. He is not ill-appearing.  HENT:     Head: Normocephalic and atraumatic.  Eyes:     Pupils: Pupils are equal, round, and reactive to light.  Cardiovascular:     Rate and Rhythm: Normal rate and regular rhythm.  Pulmonary:     Effort: Pulmonary effort is normal. No respiratory distress.  Skin:    Capillary Refill: Capillary refill takes less than 2 seconds.  Neurological:     Mental Status: He is alert and oriented to person, place, and time.  Psychiatric:        Mood and Affect: Mood normal.        Behavior: Behavior normal.        Assessment/Plan: 1. Centrilobular emphysema (HCC) (Primary) Scheduled for PFT tomorrow and will follow up with Dr. Elfreda Bathe in January for the results.   2. Chronic bilateral low back pain with bilateral sciatica Pain med refill ordered. Referred to Dr. Dorrine at [facility] for pain medication management.  - oxyCODONE -acetaminophen  (PERCOCET/ROXICET) 5-325 MG tablet; Take 1 tablet by  mouth 2 (two) times daily as needed for severe pain (pain score 7-10).  Dispense: 60 tablet; Refill: 0 - Ambulatory referral to Pain Clinic  3. Gastroesophageal reflux disease without esophagitis Lansoprazole  dose increased, take as prescribed.  - lansoprazole  (PREVACID ) 30 MG capsule; Take 1 capsule (30 mg total) by mouth daily at 12 noon.  Dispense: 30 capsule; Refill: 5  4. Encounter for long-term current use of high risk medication UDS was negative. He ran out of his percocet about 4 days ago. Refill sent to pharmacy. Referred to pain management at [facility] center for continued pain medication management.  - POCT Urine Drug Screen - oxyCODONE -acetaminophen  (PERCOCET/ROXICET) 5-325 MG tablet; Take 1 tablet by mouth 2 (two) times daily as needed for severe pain (pain score 7-10).  Dispense: 60 tablet; Refill: 0  5. Chronic, continuous use of opioids Pain med refill ordered. Referred to Dr. Dorrine at [facility] for pain medication management.  - oxyCODONE -acetaminophen  (PERCOCET/ROXICET) 5-325 MG tablet; Take 1 tablet by mouth 2 (two) times daily as needed for severe pain (pain score 7-10).  Dispense: 60 tablet; Refill: 0 - Ambulatory referral to Pain Clinic  6. Cardiomegaly Echocardiogram ordered  - ECHOCARDIOGRAM COMPLETE; Future  7. Ascending aorta dilatation Echocardiogram ordered  - ECHOCARDIOGRAM COMPLETE; Future  8. Coronary artery disease due to calcified coronary lesion Echocardiogram ordered  - ECHOCARDIOGRAM COMPLETE; Future    General Counseling: Nathan Meyer verbalizes understanding of the findings of todays visit and agrees with plan of treatment. I have discussed any further diagnostic evaluation that may be needed or ordered today. We also reviewed his medications today. he has been encouraged to call the office with any questions or concerns that should arise related to todays visit.    Orders Placed This Encounter  Procedures   Ambulatory referral  to Pain Clinic   POCT Urine Drug Screen    Meds ordered this encounter  Medications   oxyCODONE -acetaminophen  (PERCOCET/ROXICET) 5-325 MG tablet    Sig: Take 1 tablet by mouth 2 (two) times daily as needed for severe pain (pain score 7-10).    Dispense:  60 tablet    Refill:  0    Note change in dose and number of tablets fill new script now.  lansoprazole  (PREVACID ) 30 MG capsule    Sig: Take 1 capsule (30 mg total) by mouth daily at 12 noon.    Dispense:  30 capsule    Refill:  5    For next refill discontinue 15 mg capsule and fill new script today    Return for need pft scheduled for tomorrow.   Total time spent:30 Minutes Time spent includes review of chart, medications, test results, and follow up plan with the patient.   [place] Controlled Substance Database was reviewed by me.  This patient was seen by Mardy Maxin, FNP-C in collaboration with Dr. Sigrid Bathe as a part of collaborative care agreement.   Kacey Vicuna R. Maxin, MSN, FNP-C Internal medicine

## 2024-01-10 ENCOUNTER — Ambulatory Visit: Admitting: Internal Medicine

## 2024-01-10 DIAGNOSIS — J432 Centrilobular emphysema: Secondary | ICD-10-CM | POA: Diagnosis not present

## 2024-01-12 NOTE — Procedures (Signed)
 [facility] [address] [place], [address]    Complete Pulmonary Function Testing Interpretation:  FINDINGS:  The forced vital capacity is normal.  FEV1 is normal.  FEV1 FVC ratio was mildly decreased.  Postbronchodilator there is no significant change in the FEV1 noted.  Total lung capacity mildly decreased residual volume decreased FRC decreased with DLCO was mildly decreased.  IMPRESSION:  This pulmonary function study is consistent with a mild restrictive lung disease and decreased DLCO  Nathan DELENA Bathe, MD [facility] Pulmonary Critical Care Medicine Sleep Medicine

## 2024-01-15 ENCOUNTER — Encounter: Payer: Self-pay | Admitting: Psychiatry

## 2024-01-15 ENCOUNTER — Other Ambulatory Visit: Payer: Self-pay

## 2024-01-15 ENCOUNTER — Telehealth: Payer: Self-pay | Admitting: Nurse Practitioner

## 2024-01-15 ENCOUNTER — Ambulatory Visit (INDEPENDENT_AMBULATORY_CARE_PROVIDER_SITE_OTHER): Admitting: Psychiatry

## 2024-01-15 VITALS — BP 115/77 | HR 95 | Temp 97.9°F | Ht 72.0 in | Wt 211.8 lb

## 2024-01-15 DIAGNOSIS — F411 Generalized anxiety disorder: Secondary | ICD-10-CM

## 2024-01-15 DIAGNOSIS — F1320 Sedative, hypnotic or anxiolytic dependence, uncomplicated: Secondary | ICD-10-CM

## 2024-01-15 DIAGNOSIS — G4701 Insomnia due to medical condition: Secondary | ICD-10-CM | POA: Diagnosis not present

## 2024-01-15 DIAGNOSIS — F3341 Major depressive disorder, recurrent, in partial remission: Secondary | ICD-10-CM | POA: Diagnosis not present

## 2024-01-15 MED ORDER — CLONAZEPAM 0.5 MG PO TABS: 0.5000 mg | ORAL_TABLET | ORAL | 0 refills | Status: DC

## 2024-01-15 MED ORDER — PAROXETINE HCL 40 MG PO TABS: 40.0000 mg | ORAL_TABLET | Freq: Every day | ORAL | 1 refills | Status: DC

## 2024-01-15 MED ORDER — PAROXETINE HCL 10 MG PO TABS: 10.0000 mg | ORAL_TABLET | Freq: Every day | ORAL | 2 refills | Status: DC

## 2024-01-15 NOTE — Telephone Encounter (Signed)
 Notified patient of Echo appointment date, arrival time, location-Nathan Meyer

## 2024-01-15 NOTE — Addendum Note (Signed)
 Addended by: Sweetie Giebler on: 01/15/2024 08:20 AM   Modules accepted: Orders

## 2024-01-15 NOTE — Progress Notes (Signed)
 [facility] MD OP Progress Note  01/15/2024 12:42 PM Nathan Meyer  MRN:  [id number]  Chief Complaint:  Chief Complaint  Patient presents with   Follow-up   Anxiety   Depression   Medication Refill   Discussed the use of AI scribe software for clinical note transcription with the patient, who gave verbal consent to proceed.  History of Present Illness Nathan Meyer is a 63 year old Caucasian male, lives with girlfriend in [place], has a history of MDD, GAD, insomnia, chronic pain was evaluated in office today for a follow-up appointment.  Ongoing anxiety affects him, and he describes persistent worry, particularly regarding his mother's health and situational stressors. He notes improvement in his tendency to react harshly or quickly in interpersonal situations, stating that he now thinks before responding and considers this his most significant progress. He continues to experience anxiety while taking medication and questions the effectiveness of his current regimen.  Sleep disturbance impacts him, and he reports waking up every few hours during the night and needing to drink water due to dryness. Over the past 4 to 5 days, he has experienced stretches of 3 to 4 hours of uninterrupted sleep, which he considers an improvement. He notes diminished appetite and interest in food, stating that nothing tastes or sounds appealing, though he maintains his weight and continues to eat sweets. His girlfriend sometimes cooks, but he often orders food.  He currently uses Paxil  40 mg daily most days, usually in the morning, though he missed his dose today due to running out. He has taken the increased dose since his last appointment in September and feels the effect is only fair.  He takes Klonopin  as half a tablet in the morning and half in the evening, sometimes exceeding the prescribed amount.  He has 1/2 tablet left which he agrees to continue to have for the next couple of days.  He will be due  for the next prescription only on December 3. He uses trazodone  as needed . : He reports overtaking prescribed oxycodone , which resulted in running out early and experiencing a self-managed detoxification for approximately 6 days about 1 to 1.5 weeks prior to the visit. He describes withdrawal symptoms and states willingness to detox again if unable to obtain further prescriptions. He currently takes 5 mg oxycodone  twice daily, reduced from previous higher dosing due to overtaking.  He denies any suicidality, homicidality or perceptual disturbances.   Visit Diagnosis:    ICD-10-CM   1. MDD (major depressive disorder), recurrent, in partial remission  F33.41 PARoxetine  (PAXIL ) 10 MG tablet    2. GAD (generalized anxiety disorder)  F41.1 PARoxetine  (PAXIL ) 10 MG tablet    PARoxetine  (PAXIL ) 40 MG tablet    clonazePAM  (KLONOPIN ) 0.5 MG tablet    3. Insomnia due to medical condition  G47.01 PARoxetine  (PAXIL ) 40 MG tablet   anxiety, pain    4. Benzodiazepine dependence (HCC)  F13.20 clonazePAM  (KLONOPIN ) 0.5 MG tablet      Past Psychiatric History: I have reviewed past psychiatric history from progress note on 03/20/2023.  Past trials of medications like Zoloft, fluoxetine , Cymbalta .  Past Medical History:  Past Medical History:  Diagnosis Date   Anxiety    Arthritis    Cyst, kidney, acquired    Depression    GERD (gastroesophageal reflux disease)    Hyperlipidemia    Neuromuscular disorder (HCC)    Spine degeneration    Substance abuse (HCC)     Past Surgical History:  Procedure Laterality  Date   ANTERIOR FUSION CERVICAL SPINE  02/14/1993   finger debridement      Family Psychiatric History: I have reviewed family psychiatric history from progress note on 03/20/2023.  Family History:  Family History  Problem Relation Age of Onset   Arthritis Mother    Hypertension Mother    Hyperlipidemia Mother    Anxiety disorder Mother    Depression Father    COPD Father    Alcohol  abuse Maternal Uncle    Alcohol abuse Paternal Uncle    Alcohol abuse Paternal Grandfather    Alcohol abuse Cousin     Social History: I have reviewed social history from progress note on 03/20/2023. Social History   Socioeconomic History   Marital status: Divorced    Spouse name: Not on file   Number of children: 0   Years of education: Not on file   Highest education level: High school graduate  Occupational History   Not on file  Tobacco Use   Smoking status: Former    Current packs/day: 0.00    Average packs/day: 1 pack/day for 38.3 years (38.3 ttl pk-yrs)    Types: Cigarettes    Start date: 80    Quit date: 05/29/2021    Years since quitting: 2.6   Smokeless tobacco: Never  Vaping Use   Vaping status: Never Used  Substance and Sexual Activity   Alcohol use: No   Drug use: Not Currently    Comment: former substance abuser - cocaine, pills, marijuana   Sexual activity: Not Currently  Other Topics Concern   Not on file  Social History Narrative   Not on file   Social Drivers of Health   Financial Resource Strain: Not on file  Food Insecurity: Not on file  Transportation Needs: Not on file  Physical Activity: Not on file  Stress: Not on file  Social Connections: Not on file    Allergies:  Allergies  Allergen Reactions   Nsaids Nausea And Vomiting and Anaphylaxis    Only in very high doses   Prednisone Anaphylaxis   Tolmetin Nausea And Vomiting    Only in very high doses    Metabolic Disorder Labs: Lab Results  Component Value Date   HGBA1C 5.6 06/21/2023   No results found for: PROLACTIN Lab Results  Component Value Date   CHOL 145 06/21/2023   TRIG 162 (H) 06/21/2023   HDL 33 (L) 06/21/2023   CHOLHDL 4.4 06/21/2023   LDLCALC 84 06/21/2023   LDLCALC 75 12/15/2022   Lab Results  Component Value Date   TSH 1.860 05/17/2021    Therapeutic Level Labs: No results found for: LITHIUM No results found for: VALPROATE No results found for:  CBMZ  Current Medications: Current Outpatient Medications  Medication Sig Dispense Refill   PARoxetine  (PAXIL ) 10 MG tablet Take 1 tablet (10 mg total) by mouth daily with breakfast. Take along with 40 mg daily , total of 50 mg daily 30 tablet 2   atorvastatin  (LIPITOR) 20 MG tablet Take 1 tablet (20 mg total) by mouth at bedtime. 90 tablet 3   Cholecalciferol (VITAMIN D3 PO) Take by mouth.     [START ON 01/17/2024] clonazePAM  (KLONOPIN ) 0.5 MG tablet Take 1 tablet (0.5 mg total) by mouth as directed. Take half tablet daily AM and half tablet daily as needed 30 tablet 0   Cyanocobalamin  (B-12 PO) Take by mouth.     hydrochlorothiazide  (MICROZIDE ) 12.5 MG capsule Take 1 capsule (12.5 mg total) by mouth  every morning. 90 capsule 3   irbesartan  (AVAPRO ) 75 MG tablet Take 1 tablet (75 mg total) by mouth daily. 90 tablet 3   ketoconazole  (NIZORAL ) 2 % shampoo apply three times per week, massage into scalp and leave in for at least 5 minutes before rinsing out 120 mL 11   lansoprazole  (PREVACID ) 30 MG capsule Take 1 capsule (30 mg total) by mouth daily at 12 noon. 30 capsule 5   Magnesium Gluconate 550 MG TABS Take by mouth.     metroNIDAZOLE  (METROCREAM ) 0.75 % cream Apply twice daily to affected areas of face (Patient not taking: Reported on 11/13/2023) 45 g 11   Multiple Vitamin (MULTIVITAMIN ADULT PO) Take by mouth.     oxyCODONE -acetaminophen  (PERCOCET/ROXICET) 5-325 MG tablet Take 1 tablet by mouth 2 (two) times daily as needed for severe pain (pain score 7-10). 60 tablet 0   PARoxetine  (PAXIL ) 40 MG tablet Take 1 tablet (40 mg total) by mouth daily with breakfast. Take along with 10 mg daily , total of 50 mg daily 30 tablet 1   pneumococcal 20-valent conjugate vaccine (PREVNAR 20) 0.5 ML injection Inject 0.5 mLs into the muscle once as needed for up to 1 dose for immunization. 0.5 mL 0   SYRINGE-NEEDLE, DISP, 3 ML (B-D SYRINGE/NEEDLE 3CC/23GX1) 23G X 1 3 ML MISC Use 1 syringe with needle every  21 days; To be used for IM testosterone  injection. 50 each 0   testosterone  cypionate (DEPOTESTOSTERONE CYPIONATE) 200 MG/ML injection Inject 0.5 mLs (100 mg total) into the muscle every 21 days. 10 mL 0   tiZANidine  (ZANAFLEX ) 2 MG tablet TAKE 1 TABLET BY MOUTH EVERY 6 HOURS AS NEEDED FOR MUSCLE SPASM **NO MORE THAN 4 TABS IN 24 HOURS** 120 tablet 1   traZODone  (DESYREL ) 100 MG tablet Take 1-2 tablets (100-200 mg total) by mouth at bedtime. Takes (2) 100mg  tabs every night 180 tablet 3   No current facility-administered medications for this visit.     Musculoskeletal: Strength & Muscle Tone: within normal limits Gait & Station: normal Patient leans: N/A  Psychiatric Specialty Exam: Review of Systems  Psychiatric/Behavioral:  Positive for dysphoric mood and sleep disturbance. The patient is nervous/anxious.     Blood pressure 115/77, pulse 95, temperature 97.9 F (36.6 C), temperature source Temporal, height 6' (1.829 m), weight 211 lb 12.8 oz (96.1 kg).Body mass index is 28.73 kg/m.  General Appearance: Casual  Eye Contact:  Fair  Speech:  Clear and Coherent  Volume:  Normal  Mood: Anxious, dysphoric at times mostly due to situational stressors  Affect:  Appropriate  Thought Process:  Goal Directed and Descriptions of Associations: Intact  Orientation:  Full (Time, Place, and Person)  Thought Content: Logical   Suicidal Thoughts:  No  Homicidal Thoughts:  No  Memory:  Immediate;   Fair Recent;   Fair Remote;   Fair  Judgement:  Fair  Insight:  Fair  Psychomotor Activity:  Normal  Concentration:  Concentration: Fair and Attention Span: Fair  Recall:  [employer] of Knowledge: Fair  Language: Fair  Akathisia:  No  Handed:  Right  AIMS (if indicated): not done  Assets:  Communication Skills Desire for Improvement Housing Social Support Transportation  ADL's:  Intact  Cognition: WNL  Sleep:  Varies mostly due to other factors, dry mouth, need to void, pain    Screenings: GAD-7    Flowsheet Row Office Visit from 01/15/2024 in [facility] Office Visit from 11/13/2023  in [facility] Office Visit from 07/12/2023 in [facility] Office Visit from 04/27/2023 in [facility] Office Visit from 03/20/2023 in [facility]  Total GAD-7 Score 11 11 16 13 13    Mini-Mental    Flowsheet Row Office Visit from 06/15/2023 in [facility] Office Visit from 06/14/2022 in [facility] Office Visit from 06/09/2021 in [facility]  Total Score (max 30 points ) 30 30 30    PHQ2-9    Flowsheet Row Office Visit from 01/15/2024 in [facility] Office Visit from 11/13/2023 in [facility] Office Visit from 08/30/2023 in [facility] Office Visit from 07/12/2023 in [facility] Office Visit from 04/27/2023 in [facility]  PHQ-2 Total Score 3 4 5 4 4   PHQ-9 Total Score 8 16 16 18 17    Flowsheet Row Office Visit from 01/15/2024 in [facility] Office Visit from 11/13/2023 in [facility] Office Visit from 08/30/2023 in [facility]  C-SSRS RISK CATEGORY No Risk No Risk No Risk     Assessment and Plan: Nathan Meyer is a 63 year old Caucasian male who presented for a follow-up appointment, discussed assessment and plan as noted below.  1. MDD (major depressive disorder), recurrent, in partial remission Currently reports some improvement with mood symptoms on the Paxil , agreeable to dosage increase. Increase Paxil  to 50 mg  daily Declines referral for CBT  2. GAD (generalized anxiety disorder)-improving Currently reports some improvement with anxiety symptoms.  Patient currently overusing clonazepam .  Provided education and discussed with patient clonazepam  will not be prescribed if he does not follow instructions. Continue Paxil  50 mg daily Continue Clonazepam  0.25 mg in the morning and 0.25 mg as needed for severe anxiety symptoms Reviewed  PMP AWARxE Discussed controlled substance policy with patient including dismissal from practice if not following recommendations/medication instructions.  3. Insomnia due to medical condition-improving Sleep varies due to multiple factors including pain, dry mouth, need to void. Continue Trazodone  100-200 mg at bedtime as needed Will need sufficient pain management  4. Benzodiazepine dependence (HCC)-chronic Currently reports overusing medications prescribed, provided education. Continue Clonazepam  0.25 mg in the morning and 0.25 mg as needed. Urine drug screen reviewed dated 01/09/2024-negative for all drugs tested. Will consider repeating urine drug screen with benzodiazepine confirmatory test. Patient advised to go to the nearest emergency department if he has any withdrawal symptoms.  Provided education about benzodiazepine withdrawal symptoms including seizures, death.  Patient voiced understanding.  Follow-up Follow-up in clinic in 2 to 3 months or sooner in person.    Collaboration of Care: Collaboration of Care: Patient refused AEB patient declines referral for CBT.  Patient/Guardian was advised Release of Information must be obtained prior to any record release in order to collaborate their care with an outside provider. Patient/Guardian was advised if they have not already done so to contact the registration department to sign all necessary forms in order for us  to release information regarding their care.   Consent: Patient/Guardian gives verbal consent  for treatment and assignment of benefits for services provided during this visit. Patient/Guardian expressed understanding and agreed to proceed.   This note was generated in part or whole with voice recognition software. Voice recognition is usually quite accurate  but there are transcription errors that can and very often do occur. I apologize for any typographical errors that were not detected and corrected.    Greg Cratty, MD 01/15/2024, 12:42 PM

## 2024-01-16 ENCOUNTER — Other Ambulatory Visit: Payer: Self-pay | Admitting: Nurse Practitioner

## 2024-01-16 ENCOUNTER — Telehealth: Payer: Self-pay | Admitting: Nurse Practitioner

## 2024-01-16 DIAGNOSIS — I10 Essential (primary) hypertension: Secondary | ICD-10-CM

## 2024-01-16 NOTE — Telephone Encounter (Signed)
 Pain Management referral faxed to Dr. Dorrine w/ [facility]; [phone]. Lvm notifying patient. Gave pt telephone [phone]

## 2024-01-22 ENCOUNTER — Ambulatory Visit: Admission: RE | Admit: 2024-01-22 | Discharge: 2024-01-22 | Attending: Nurse Practitioner

## 2024-01-22 DIAGNOSIS — I2584 Coronary atherosclerosis due to calcified coronary lesion: Secondary | ICD-10-CM | POA: Diagnosis not present

## 2024-01-22 DIAGNOSIS — I517 Cardiomegaly: Secondary | ICD-10-CM

## 2024-01-22 DIAGNOSIS — I2510 Atherosclerotic heart disease of native coronary artery without angina pectoris: Secondary | ICD-10-CM | POA: Diagnosis not present

## 2024-01-22 DIAGNOSIS — I77810 Thoracic aortic ectasia: Secondary | ICD-10-CM | POA: Diagnosis not present

## 2024-01-22 LAB — ECHOCARDIOGRAM COMPLETE
AR max vel: 2.59 cm2
AV Area VTI: 2.55 cm2
AV Area mean vel: 2.34 cm2
AV Mean grad: 1 mmHg
AV Peak grad: 1.9 mmHg
Ao pk vel: 0.7 m/s
Area-P 1/2: 3.4 cm2
MV VTI: 1.72 cm2
S' Lateral: 3.1 cm

## 2024-01-22 NOTE — Progress Notes (Signed)
*  PRELIMINARY RESULTS* Echocardiogram 2D Echocardiogram has been performed.  Floydene Harder 01/22/2024, 10:42 AM

## 2024-02-05 ENCOUNTER — Encounter: Payer: Self-pay | Admitting: Nurse Practitioner

## 2024-02-12 DIAGNOSIS — F411 Generalized anxiety disorder: Secondary | ICD-10-CM

## 2024-02-12 DIAGNOSIS — F1320 Sedative, hypnotic or anxiolytic dependence, uncomplicated: Secondary | ICD-10-CM

## 2024-02-13 ENCOUNTER — Ambulatory Visit: Admitting: Nurse Practitioner

## 2024-02-13 MED ORDER — CLONAZEPAM 0.5 MG PO TABS: 0.5000 mg | ORAL_TABLET | ORAL | 0 refills | Status: DC

## 2024-02-19 DIAGNOSIS — L219 Seborrheic dermatitis, unspecified: Secondary | ICD-10-CM

## 2024-02-20 ENCOUNTER — Ambulatory Visit: Admitting: Internal Medicine

## 2024-02-26 ENCOUNTER — Ambulatory Visit: Admitting: Internal Medicine

## 2024-02-26 ENCOUNTER — Encounter: Payer: Self-pay | Admitting: Internal Medicine

## 2024-02-26 VITALS — BP 130/80 | HR 106 | Temp 97.8°F | Resp 16 | Ht 70.0 in | Wt 212.0 lb

## 2024-02-26 DIAGNOSIS — R918 Other nonspecific abnormal finding of lung field: Secondary | ICD-10-CM

## 2024-02-26 DIAGNOSIS — G8929 Other chronic pain: Secondary | ICD-10-CM | POA: Diagnosis not present

## 2024-02-26 DIAGNOSIS — M5441 Lumbago with sciatica, right side: Secondary | ICD-10-CM | POA: Diagnosis not present

## 2024-02-26 DIAGNOSIS — M5442 Lumbago with sciatica, left side: Secondary | ICD-10-CM

## 2024-02-26 DIAGNOSIS — K219 Gastro-esophageal reflux disease without esophagitis: Secondary | ICD-10-CM

## 2024-02-26 NOTE — Patient Instructions (Signed)
 Lung Nodule: What to Know  A lung nodule is a small, round growth of tissue in the lung. A nodule may be cancer, but most nodules are not cancer. Your nodules may not be cancer if: The nodules are small and have even edges. You don't smoke. You don't have other risk factors for lung cancer. Your nodules may be cancer if: The nodules are large and have uneven edges. You smoke. You have family members who have lung cancer. What are the causes? Infection from germs. Germs include bacteria, fungus, or virus. Cancer of the lung. Or, cancer that has spread to the lung from another part of the body. A growth of tissue that's not cancer. This is also called a mass. Swelling from problems like arthritis. A clump of blood vessels in the lung. What are the signs or symptoms? Many times, there are no symptoms. If you get symptoms, they normally have another cause, such as infection. How is this treated? Treatment for a lung nodule depends on: The cause of the nodule. Whether the nodule is cancer or not cancer. Your risk of getting cancer. Some nodules aren't cancer. If your nodule isn't cancer, you may not need treatment. Your doctor may watch the nodule for any changes. If the nodule is cancer, you'll need treatments, such as: High-energy X-rays (radiation). Medicines to kill cancer cells (chemotherapy). Medicines that kill cancer cells and spare healthy cells (targeted therapy). Medicines that help your body fight cancer (immunotherapy). Surgery to remove cancer cells. Follow these instructions at home: Take your medicines only as told. Do not smoke, vape, or use nicotine or tobacco. Keep all follow-up visits. Your provider may need to check your lungs often over a period of time. Contact a doctor if: You have pain in your chest, back, or shoulder. You have trouble breathing. You have a cough or a sore throat. You become hoarse or have trouble speaking. You have a fever or chills. You  don't feel like eating. You lose weight without trying. You start to sweat a lot during sleep. You sleep on two or more pillows to help you breathe at night. Get help right away if: You have chest pain. You can't catch your breath. You make whistling sounds when you breathe, most often when you breathe out. This is called wheezing. You cough up blood from your lungs. You become dizzy or you faint. These symptoms may be an emergency. Call 911 right away. Do not wait to see if the symptoms will go away. Do not drive yourself to the hospital. This information is not intended to replace advice given to you by your health care provider. Make sure you discuss any questions you have with your health care provider. Document Revised: 11/02/2022 Document Reviewed: 11/02/2022 Elsevier Patient Education  2025 [employer].

## 2024-02-26 NOTE — Progress Notes (Unsigned)
 [facility] [address] [place], [place] [address]  Pulmonary Sleep Medicine   Office Visit Note  Patient Name: Nathan Meyer DOB: [birth year] MRN [id number]  Date of Service: 02/26/2024  Complaints/HPI: He states he has breathing difficulty. Patient states he quit smoking recently. States also has noted high pulse on occasion. He states when he is walking this can happen. Denies having chest pain now. Patient has no fevers noted. As far as smoking he quit 3 years ago. He states he is not on inhalers at this time. PFT done shows mild restriction. Patient had nodules on his CT chest being followed last scan was in September probably benign. Follow up scan ordered and awaited.He states he has not had a sleep apnea evaluation. His girlfriend apparently has OSA. He does not want to be evaluated after seeing how his girlfriend struggles with the PAP  Office Spirometry Results:     ROS  General: (-) fever, (-) chills, (-) night sweats, (-) weakness Skin: (-) rashes, (-) itching,. Eyes: (-) visual changes, (-) redness, (-) itching. Nose and Sinuses: (-) nasal stuffiness or itchiness, (-) postnasal drip, (-) nosebleeds, (-) sinus trouble. Mouth and Throat: (-) sore throat, (-) hoarseness. Neck: (-) swollen glands, (-) enlarged thyroid , (-) neck pain. Respiratory: - cough, (-) bloody sputum, - shortness of breath, - wheezing. Cardiovascular: - ankle swelling, (-) chest pain. Lymphatic: (-) lymph node enlargement. Neurologic: (-) numbness, (-) tingling. Psychiatric: (-) anxiety, (-) depression   Current Medication: Outpatient Encounter Medications as of 02/26/2024  Medication Sig   atorvastatin  (LIPITOR) 20 MG tablet Take 1 tablet (20 mg total) by mouth at bedtime.   Cholecalciferol (VITAMIN D3 PO) Take by mouth.   clonazePAM  (KLONOPIN ) 0.5 MG tablet Take 1 tablet (0.5 mg total) by mouth as directed. Take half tablet daily AM and half tablet daily as needed    Cyanocobalamin  (B-12 PO) Take by mouth.   hydrochlorothiazide  (MICROZIDE ) 12.5 MG capsule TAKE 1 CAPSULE BY MOUTH EVERY MORNING   irbesartan  (AVAPRO ) 75 MG tablet Take 1 tablet (75 mg total) by mouth daily.   ketoconazole  (NIZORAL ) 2 % shampoo apply three times per week, massage into scalp and leave in for at least 5 minutes before rinsing out   lansoprazole  (PREVACID ) 30 MG capsule Take 1 capsule (30 mg total) by mouth daily at 12 noon.   Magnesium Gluconate 550 MG TABS Take by mouth.   metroNIDAZOLE  (METROCREAM ) 0.75 % cream Apply twice daily to affected areas of face (Patient not taking: Reported on 11/13/2023)   mometasone  (ELOCON ) 0.1 % lotion APPLY TOPICALLY 3-4 TIMES PER WEEK AS NEEDED FOR ITCH   Multiple Vitamin (MULTIVITAMIN ADULT PO) Take by mouth.   oxyCODONE -acetaminophen  (PERCOCET/ROXICET) 5-325 MG tablet Take 1 tablet by mouth 2 (two) times daily as needed for severe pain (pain score 7-10).   PARoxetine  (PAXIL ) 10 MG tablet Take 1 tablet (10 mg total) by mouth daily with breakfast. Take along with 40 mg daily , total of 50 mg daily   PARoxetine  (PAXIL ) 40 MG tablet Take 1 tablet (40 mg total) by mouth daily with breakfast. Take along with 10 mg daily , total of 50 mg daily   pneumococcal 20-valent conjugate vaccine (PREVNAR 20) 0.5 ML injection Inject 0.5 mLs into the muscle once as needed for up to 1 dose for immunization.   SYRINGE-NEEDLE, DISP, 3 ML (B-D SYRINGE/NEEDLE 3CC/23GX1) 23G X 1 3 ML MISC Use 1 syringe with needle every 21 days; To be used for IM  testosterone  injection.   testosterone  cypionate (DEPOTESTOSTERONE CYPIONATE) 200 MG/ML injection Inject 0.5 mLs (100 mg total) into the muscle every 21 days.   tiZANidine  (ZANAFLEX ) 2 MG tablet TAKE 1 TABLET BY MOUTH EVERY 6 HOURS AS NEEDED FOR MUSCLE SPASM **NO MORE THAN 4 TABS IN 24 HOURS**   traZODone  (DESYREL ) 100 MG tablet Take 1-2 tablets (100-200 mg total) by mouth at bedtime. Takes (2) 100mg  tabs every night   No  facility-administered encounter medications on file as of 02/26/2024.    Surgical History: Past Surgical History:  Procedure Laterality Date   ANTERIOR FUSION CERVICAL SPINE  02/14/1993   finger debridement      Medical History: Past Medical History:  Diagnosis Date   Anxiety    Arthritis    Cyst, kidney, acquired    Depression    GERD (gastroesophageal reflux disease)    Hyperlipidemia    Neuromuscular disorder (HCC)    Spine degeneration    Substance abuse (HCC)     Family History: Family History  Problem Relation Age of Onset   Arthritis Mother    Hypertension Mother    Hyperlipidemia Mother    Anxiety disorder Mother    Depression Father    COPD Father    Alcohol abuse Maternal Uncle    Alcohol abuse Paternal Uncle    Alcohol abuse Paternal Grandfather    Alcohol abuse Cousin     Social History: Social History   Socioeconomic History   Marital status: Divorced    Spouse name: Not on file   Number of children: 0   Years of education: Not on file   Highest education level: High school graduate  Occupational History   Not on file  Tobacco Use   Smoking status: Former    Current packs/day: 0.00    Average packs/day: 1 pack/day for 38.3 years (38.3 ttl pk-yrs)    Types: Cigarettes    Start date: 21    Quit date: 05/29/2021    Years since quitting: 2.7   Smokeless tobacco: Never  Vaping Use   Vaping status: Never Used  Substance and Sexual Activity   Alcohol use: No   Drug use: Not Currently    Comment: former substance abuser - cocaine, pills, marijuana   Sexual activity: Not Currently  Other Topics Concern   Not on file  Social History Narrative   Not on file   Social Drivers of Health   Tobacco Use: Medium Risk (02/26/2024)   Patient History    Smoking Tobacco Use: Former    Smokeless Tobacco Use: Never    Passive Exposure: Not on Actuary Strain: Not on file  Food Insecurity: Not on file  Transportation Needs: Not on  file  Physical Activity: Not on file  Stress: Not on file  Social Connections: Not on file  Intimate Partner Violence: Not on file  Depression (PHQ2-9): Medium Risk (01/15/2024)   Depression (PHQ2-9)    PHQ-2 Score: 8  Alcohol Screen: Low Risk (11/08/2021)   Alcohol Screen    Last Alcohol Screening Score (AUDIT): 0  Housing: Not on file  Utilities: Not on file  Health Literacy: Not on file    Vital Signs: Blood pressure 130/80, pulse (!) 106, temperature 97.8 F (36.6 C), resp. rate 16, height 5' 10 (1.778 m), weight 212 lb (96.2 kg), SpO2 95%.  Examination: General Appearance: The patient is well-developed, well-nourished, and in no distress. Skin: Gross inspection of skin unremarkable. Head: normocephalic, no gross deformities. Eyes:  no gross deformities noted. ENT: ears appear grossly normal no exudates. Neck: Supple. No thyromegaly. No LAD. Respiratory: no rhonchi noted. Cardiovascular: Normal S1 and S2 without murmur or rub. Extremities: No cyanosis. pulses are equal. Neurologic: Alert and oriented. No involuntary movements.  LABS: Recent Results (from the past 2160 hours)  POCT Urine Drug Screen     Status: None   Collection Time: 01/09/24 11:36 AM  Result Value Ref Range   POC Methamphetamine UR None Detected None Detected   POC Opiate Ur None Detected None Detected   POC Barbiturate UR None Detected None Detected   POC Amphetamine UR None Detected None Detected   POC Oxycodone  UR None Detected None Detected   POC Cocaine UR None Detected None Detected   POC Ecstasy UR None Detected None Detected   POC TRICYCLICS UR None Detected None Detected   POC PHENCYCLIDINE UR None Detected None Detected   POC Marijuana UR None Detected None Detected   POC Methadone UR None Detected None Detected   POC BENZODIAZEPINES UR None Detected None Detected   URINE TEMPERATURE     POC DRUG SCREEN OXIDANTS URINE     POC SPECIFIC GRAVITY URINE     POC PH URINE      Methylenedioxyamphetamine None Detected None Detected  ECHOCARDIOGRAM COMPLETE     Status: None   Collection Time: 01/22/24 10:41 AM  Result Value Ref Range   Ao pk vel 0.70 m/s   AV Area VTI 2.55 cm2   AR max vel 2.59 cm2   AV Mean grad 1.0 mmHg   AV Peak grad 1.9 mmHg   S' Lateral 3.10 cm   AV Area mean vel 2.34 cm2   Area-P 1/2 3.40 cm2   MV VTI 1.72 cm2   Est EF 55 - 60%     Radiology: ECHOCARDIOGRAM COMPLETE Result Date: 01/22/2024    ECHOCARDIOGRAM REPORT   Patient Name:   TASHAN KREITZER Date of Exam: 01/22/2024 Medical Rec #:  [id number]          Height:       72.0 in Accession #:    [id number]         Weight:       211.8 lb Date of Birth:  11/02/1960          BSA:          2.183 m Patient Age:    63 years           BP:           100/80 mmHg Patient Gender: M                  HR:           99 bpm. Exam Location:  [facility] Procedure: 2D Echo, Cardiac Doppler and Color Doppler (Both Spectral and Color            Flow Doppler were utilized during procedure). Indications:     Cardiomegaly I51.7                  CAD native vessel I25.10  History:         Patient has no prior history of Echocardiogram examinations.                  Risk Factors:Dyslipidemia. Anxiety.  Sonographer:     Christopher Furnace Referring Phys:  [id number] MARDY MAXIN Diagnosing Phys: Deatrice Cage MD IMPRESSIONS  1. Left ventricular ejection fraction, by estimation,  is 55 to 60%. The left ventricle has normal function. The left ventricle has no regional wall motion abnormalities. Left ventricular diastolic parameters are consistent with Grade I diastolic dysfunction (impaired relaxation).  2. Right ventricular systolic function is normal. The right ventricular size is normal. Mildly increased right ventricular wall thickness. Tricuspid regurgitation signal is inadequate for assessing PA pressure.  3. The mitral valve is normal in structure. No evidence of mitral valve regurgitation. No evidence of mitral stenosis.  4. The aortic  valve is normal in structure. Aortic valve regurgitation is not visualized. No aortic stenosis is present.  5. Aortic dilatation noted. There is mild dilatation of the ascending aorta, measuring 39 mm.  6. The inferior vena cava is normal in size with greater than 50% respiratory variability, suggesting right atrial pressure of 3 mmHg. FINDINGS  Left Ventricle: Left ventricular ejection fraction, by estimation, is 55 to 60%. The left ventricle has normal function. The left ventricle has no regional wall motion abnormalities. The left ventricular internal cavity size was normal in size. There is  no left ventricular hypertrophy. Left ventricular diastolic parameters are consistent with Grade I diastolic dysfunction (impaired relaxation). Right Ventricle: The right ventricular size is normal. Mildly increased right ventricular wall thickness. Right ventricular systolic function is normal. Tricuspid regurgitation signal is inadequate for assessing PA pressure. The tricuspid regurgitant velocity is 1.52 m/s, and with an assumed right atrial pressure of 3 mmHg, the estimated right ventricular systolic pressure is 12.2 mmHg. Left Atrium: Left atrial size was normal in size. Right Atrium: Right atrial size was normal in size. Pericardium: There is no evidence of pericardial effusion. Mitral Valve: The mitral valve is normal in structure. No evidence of mitral valve regurgitation. No evidence of mitral valve stenosis. MV peak gradient, 3.2 mmHg. The mean mitral valve gradient is 1.0 mmHg. Tricuspid Valve: The tricuspid valve is normal in structure. Tricuspid valve regurgitation is not demonstrated. No evidence of tricuspid stenosis. Aortic Valve: The aortic valve is normal in structure. Aortic valve regurgitation is not visualized. No aortic stenosis is present. Aortic valve mean gradient measures 1.0 mmHg. Aortic valve peak gradient measures 1.9 mmHg. Aortic valve area, by VTI measures 2.55 cm. Pulmonic Valve: The  pulmonic valve was normal in structure. Pulmonic valve regurgitation is not visualized. No evidence of pulmonic stenosis. Aorta: Aortic dilatation noted. There is mild dilatation of the ascending aorta, measuring 39 mm. Venous: The inferior vena cava is normal in size with greater than 50% respiratory variability, suggesting right atrial pressure of 3 mmHg. IAS/Shunts: No atrial level shunt detected by color flow Doppler.  LEFT VENTRICLE PLAX 2D LVIDd:         4.45 cm   Diastology LVIDs:         3.10 cm   LV e' medial:    4.35 cm/s LV PW:         1.20 cm   LV E/e' medial:  9.8 LV IVS:        0.95 cm   LV e' lateral:   5.66 cm/s LVOT diam:     2.00 cm   LV E/e' lateral: 7.6 LV SV:         32 LV SV Index:   15 LVOT Area:     3.14 cm LV IVRT:       114 msec  RIGHT VENTRICLE RV Basal diam:  3.90 cm RV Mid diam:    3.90 cm RV S prime:     10.20 cm/s TAPSE (  M-mode): 1.5 cm LEFT ATRIUM             Index        RIGHT ATRIUM           Index LA Vol (A2C):   61.6 ml 28.22 ml/m  RA Area:     18.90 cm LA Vol (A4C):   30.7 ml 14.06 ml/m  RA Volume:   49.70 ml  22.77 ml/m LA Biplane Vol: 44.8 ml 20.52 ml/m  AORTIC VALVE AV Area (Vmax):    2.59 cm AV Area (Vmean):   2.34 cm AV Area (VTI):     2.55 cm AV Vmax:           69.55 cm/s AV Vmean:          50.200 cm/s AV VTI:            0.124 m AV Peak Grad:      1.9 mmHg AV Mean Grad:      1.0 mmHg LVOT Vmax:         57.40 cm/s LVOT Vmean:        37.400 cm/s LVOT VTI:          0.101 m LVOT/AV VTI ratio: 0.81  AORTA Ao Root diam: 3.70 cm MITRAL VALVE               TRICUSPID VALVE MV Area (PHT): 3.40 cm    TR Peak grad:   9.2 mmHg MV Area VTI:   1.72 cm    TR Vmax:        152.00 cm/s MV Peak grad:  3.2 mmHg MV Mean grad:  1.0 mmHg    SHUNTS MV Vmax:       0.89 m/s    Systemic VTI:  0.10 m MV Vmean:      52.5 cm/s   Systemic Diam: 2.00 cm MV Decel Time: 223 msec MV E velocity: 42.80 cm/s MV A velocity: 70.30 cm/s MV E/A ratio:  0.61 Deatrice Cage MD Electronically signed by  Deatrice Cage MD Signature Date/Time: 01/22/2024/3:19:29 PM    Final     No results found.  No results found.  Assessment and Plan: Patient Active Problem List   Diagnosis Date Noted   Coronary artery disease due to calcified coronary lesion 12/20/2023   Cardiomegaly 12/20/2023   Centrilobular emphysema (HCC) 12/20/2023   Chronic, continuous use of opioids 12/20/2023   At risk for respiratory depression due to opioid 12/20/2023   Mixed hyperlipidemia 12/19/2023   Ascending aorta dilatation 07/28/2023   Pulmonary nodules/lesions, multiple 07/28/2023   Lumbar radiculopathy, chronic 07/28/2023   Stopped smoking with greater than 30 pack year history 07/28/2023   Drug reaction 06/28/2023   Chronic bilateral low back pain with bilateral sciatica 05/11/2023   Hypogonadism in male 05/11/2023   Benzodiazepine dependence (HCC) 04/27/2023   GAD (generalized anxiety disorder) 03/20/2023   MDD (major depressive disorder), recurrent episode, moderate (HCC) 03/20/2023   Insomnia 03/20/2023   Degenerative disc disease, lumbar 05/05/2021   Essential hypertension 04/14/2017   Thumb pain, right 12/13/2016   Ventral hernia 02/23/2012   Lumbar facet arthropathy 03/07/2009   GERD (gastroesophageal reflux disease) 05/05/2008    1. Pulmonary nodules/lesions, multiple (Primary) We will continue to follow his pulmonary nodules.  He is no longer smoking but still needs to have annual screening done we will go ahead and get this set up for him.  2. Gastroesophageal reflux disease without esophagitis Reflux is under fairly good control.  Needs  to avoid sleeping completely flat in the bed and elevate the head end of the bed.  Patient also needs to avoid alcohol and late meals.  3. Chronic bilateral low back pain with bilateral sciatica Patient has a history of sciatica I will continue with supportive care follow-up with his own primary team for that  General Counseling: I have discussed the findings  of the evaluation and examination with [facility].  I have also discussed any further diagnostic evaluation thatmay be needed or ordered today. Patricia verbalizes understanding of the findings of todays visit. We also reviewed his medications today and discussed drug interactions and side effects including but not limited excessive drowsiness and altered mental states. We also discussed that there is always a risk not just to him but also people around him. he has been encouraged to call the office with any questions or concerns that should arise related to todays visit.  No orders of the defined types were placed in this encounter.    Time spent: 110  I have personally obtained a history, examined the patient, evaluated laboratory and imaging results, formulated the assessment and plan and placed orders.    Elfreda DELENA Bathe, MD [facility] Pulmonary and Critical Care Sleep medicine

## 2024-02-28 ENCOUNTER — Other Ambulatory Visit: Payer: Self-pay | Admitting: Nurse Practitioner

## 2024-02-28 ENCOUNTER — Other Ambulatory Visit: Payer: Self-pay | Admitting: Psychiatry

## 2024-02-28 DIAGNOSIS — E291 Testicular hypofunction: Secondary | ICD-10-CM

## 2024-02-28 DIAGNOSIS — G4700 Insomnia, unspecified: Secondary | ICD-10-CM

## 2024-03-04 LAB — PULMONARY FUNCTION TEST

## 2024-03-07 ENCOUNTER — Other Ambulatory Visit: Payer: Self-pay | Admitting: Psychiatry

## 2024-03-07 DIAGNOSIS — F3341 Major depressive disorder, recurrent, in partial remission: Secondary | ICD-10-CM

## 2024-03-07 DIAGNOSIS — G4701 Insomnia due to medical condition: Secondary | ICD-10-CM

## 2024-03-07 DIAGNOSIS — F411 Generalized anxiety disorder: Secondary | ICD-10-CM

## 2024-03-13 ENCOUNTER — Other Ambulatory Visit: Payer: Self-pay | Admitting: Psychiatry

## 2024-03-13 DIAGNOSIS — F1320 Sedative, hypnotic or anxiolytic dependence, uncomplicated: Secondary | ICD-10-CM

## 2024-03-13 DIAGNOSIS — F411 Generalized anxiety disorder: Secondary | ICD-10-CM

## 2024-04-01 ENCOUNTER — Ambulatory Visit: Admitting: Psychiatry

## 2024-06-06 ENCOUNTER — Ambulatory Visit: Admitting: Dermatology

## 2024-06-17 ENCOUNTER — Ambulatory Visit: Admitting: Nurse Practitioner

## 2024-08-26 ENCOUNTER — Ambulatory Visit: Admitting: Internal Medicine
